# Patient Record
Sex: Male | Born: 1950 | Race: White | Hispanic: No | Marital: Married | Smoking: Never smoker
Health system: Southern US, Community
[De-identification: ages and names within clinical notes are randomized; demographics above are authoritative.]

## PROBLEM LIST (undated history)

## (undated) ENCOUNTER — Encounter

## (undated) ENCOUNTER — Encounter: Attending: Adult Health | Primary: Adult Health

## (undated) ENCOUNTER — Ambulatory Visit
Payer: MEDICARE | Attending: Student in an Organized Health Care Education/Training Program | Primary: Student in an Organized Health Care Education/Training Program

## (undated) ENCOUNTER — Telehealth: Attending: Ambulatory Care | Primary: Ambulatory Care

## (undated) ENCOUNTER — Telehealth

## (undated) ENCOUNTER — Ambulatory Visit: Payer: MEDICARE

## (undated) ENCOUNTER — Encounter: Attending: Registered Nurse | Primary: Registered Nurse

## (undated) ENCOUNTER — Encounter: Attending: Ophthalmology | Primary: Ophthalmology

## (undated) ENCOUNTER — Ambulatory Visit: Payer: Medicare (Managed Care) | Attending: Hematology | Primary: Hematology

## (undated) ENCOUNTER — Ambulatory Visit

## (undated) ENCOUNTER — Encounter
Attending: Student in an Organized Health Care Education/Training Program | Primary: Student in an Organized Health Care Education/Training Program

## (undated) ENCOUNTER — Ambulatory Visit: Attending: Pharmacist | Primary: Pharmacist

## (undated) ENCOUNTER — Inpatient Hospital Stay

## (undated) ENCOUNTER — Telehealth: Attending: Adult Health | Primary: Adult Health

## (undated) ENCOUNTER — Ambulatory Visit: Payer: MEDICARE | Attending: Family | Primary: Family

## (undated) ENCOUNTER — Encounter: Attending: Physician Assistant | Primary: Physician Assistant

## (undated) ENCOUNTER — Encounter: Attending: Family | Primary: Family

## (undated) ENCOUNTER — Encounter: Payer: MEDICARE | Attending: Physician Assistant | Primary: Physician Assistant

## (undated) ENCOUNTER — Encounter: Attending: Registered" | Primary: Registered"

## (undated) ENCOUNTER — Ambulatory Visit: Payer: MEDICARE | Attending: Ambulatory Care | Primary: Ambulatory Care

## (undated) ENCOUNTER — Ambulatory Visit: Payer: Medicare (Managed Care)

## (undated) ENCOUNTER — Other Ambulatory Visit

## (undated) ENCOUNTER — Non-Acute Institutional Stay: Payer: MEDICARE

## (undated) ENCOUNTER — Telehealth: Attending: Nephrology | Primary: Nephrology

## (undated) ENCOUNTER — Encounter: Attending: Ambulatory Care | Primary: Ambulatory Care

## (undated) ENCOUNTER — Ambulatory Visit: Attending: General Practice | Primary: General Practice

## (undated) ENCOUNTER — Ambulatory Visit: Payer: Medicare (Managed Care) | Attending: Nephrology | Primary: Nephrology

## (undated) ENCOUNTER — Encounter: Attending: General Practice | Primary: General Practice

## (undated) ENCOUNTER — Ambulatory Visit: Attending: Physician Assistant | Primary: Physician Assistant

## (undated) ENCOUNTER — Ambulatory Visit
Attending: Student in an Organized Health Care Education/Training Program | Primary: Student in an Organized Health Care Education/Training Program

## (undated) ENCOUNTER — Ambulatory Visit: Payer: MEDICARE | Attending: Physician Assistant | Primary: Physician Assistant

## (undated) ENCOUNTER — Ambulatory Visit: Payer: MEDICARE | Attending: Orthopaedic Trauma | Primary: Orthopaedic Trauma

## (undated) DIAGNOSIS — I509 Heart failure, unspecified: Secondary | ICD-10-CM

## (undated) DIAGNOSIS — I130 Hypertensive heart and chronic kidney disease with heart failure and stage 1 through stage 4 chronic kidney disease, or unspecified chronic kidney disease: Secondary | ICD-10-CM

## (undated) DIAGNOSIS — N183 Chronic kidney disease, stage 3 unspecified: Secondary | ICD-10-CM

## (undated) DIAGNOSIS — M109 Gout, unspecified: Secondary | ICD-10-CM

## (undated) DIAGNOSIS — N289 Disorder of kidney and ureter, unspecified: Secondary | ICD-10-CM

## (undated) DIAGNOSIS — I10 Essential (primary) hypertension: Secondary | ICD-10-CM

## (undated) DIAGNOSIS — E559 Vitamin D deficiency, unspecified: Secondary | ICD-10-CM

## (undated) DIAGNOSIS — G4730 Sleep apnea, unspecified: Secondary | ICD-10-CM

## (undated) HISTORY — PX: TYMPANOPLASTY: SHX33

## (undated) HISTORY — PX: HERNIA REPAIR: SHX51

---

## 2005-02-02 ENCOUNTER — Emergency Department: Payer: Self-pay | Admitting: Emergency Medicine

## 2006-10-01 ENCOUNTER — Other Ambulatory Visit: Payer: Self-pay

## 2006-10-01 ENCOUNTER — Emergency Department: Payer: Self-pay

## 2007-05-07 ENCOUNTER — Ambulatory Visit: Payer: Self-pay | Admitting: Gastroenterology

## 2008-10-07 ENCOUNTER — Ambulatory Visit: Payer: Self-pay | Admitting: Surgery

## 2008-10-14 ENCOUNTER — Ambulatory Visit: Payer: Self-pay | Admitting: Surgery

## 2009-04-04 ENCOUNTER — Emergency Department: Payer: Self-pay | Admitting: Emergency Medicine

## 2009-06-01 ENCOUNTER — Emergency Department: Payer: Self-pay | Admitting: Emergency Medicine

## 2009-06-04 ENCOUNTER — Inpatient Hospital Stay: Payer: Self-pay | Admitting: *Deleted

## 2009-07-08 ENCOUNTER — Emergency Department (HOSPITAL_BASED_OUTPATIENT_CLINIC_OR_DEPARTMENT_OTHER): Admission: EM | Admit: 2009-07-08 | Discharge: 2009-07-08 | Payer: Self-pay | Admitting: Emergency Medicine

## 2009-07-08 ENCOUNTER — Ambulatory Visit: Payer: Self-pay | Admitting: Diagnostic Radiology

## 2010-03-31 ENCOUNTER — Emergency Department: Payer: Self-pay | Admitting: Emergency Medicine

## 2010-12-29 ENCOUNTER — Emergency Department: Payer: Self-pay | Admitting: Emergency Medicine

## 2011-02-09 ENCOUNTER — Ambulatory Visit: Payer: Self-pay | Admitting: Pain Medicine

## 2011-02-10 ENCOUNTER — Ambulatory Visit: Payer: Self-pay | Admitting: Pain Medicine

## 2011-02-15 ENCOUNTER — Ambulatory Visit: Payer: Self-pay | Admitting: Pain Medicine

## 2011-03-02 ENCOUNTER — Ambulatory Visit: Payer: Self-pay | Admitting: Pain Medicine

## 2011-03-08 ENCOUNTER — Ambulatory Visit: Payer: Self-pay | Admitting: Pain Medicine

## 2011-03-24 ENCOUNTER — Ambulatory Visit: Payer: Self-pay | Admitting: Pain Medicine

## 2011-04-27 ENCOUNTER — Ambulatory Visit: Payer: Self-pay | Admitting: Pain Medicine

## 2011-06-09 ENCOUNTER — Ambulatory Visit: Payer: Self-pay | Admitting: Pain Medicine

## 2011-06-22 ENCOUNTER — Ambulatory Visit: Payer: Self-pay | Admitting: Pain Medicine

## 2011-07-25 ENCOUNTER — Emergency Department: Payer: Self-pay | Admitting: Emergency Medicine

## 2011-10-11 ENCOUNTER — Ambulatory Visit: Payer: Self-pay | Admitting: Anesthesiology

## 2012-01-26 ENCOUNTER — Ambulatory Visit: Payer: Self-pay | Admitting: Family Medicine

## 2013-04-02 ENCOUNTER — Emergency Department: Payer: Self-pay | Admitting: Internal Medicine

## 2013-08-30 ENCOUNTER — Emergency Department: Payer: Self-pay | Admitting: Emergency Medicine

## 2013-08-30 LAB — CBC
HCT: 39.8 % — AB (ref 40.0–52.0)
HGB: 12.9 g/dL — ABNORMAL LOW (ref 13.0–18.0)
MCH: 26.4 pg (ref 26.0–34.0)
MCHC: 32.4 g/dL (ref 32.0–36.0)
MCV: 81 fL (ref 80–100)
Platelet: 285 10*3/uL (ref 150–440)
RBC: 4.9 10*6/uL (ref 4.40–5.90)
RDW: 15.3 % — AB (ref 11.5–14.5)
WBC: 8.4 10*3/uL (ref 3.8–10.6)

## 2013-08-30 LAB — BASIC METABOLIC PANEL
Anion Gap: 7 (ref 7–16)
BUN: 25 mg/dL — ABNORMAL HIGH (ref 7–18)
CALCIUM: 8.7 mg/dL (ref 8.5–10.1)
CREATININE: 1.93 mg/dL — AB (ref 0.60–1.30)
Chloride: 103 mmol/L (ref 98–107)
Co2: 28 mmol/L (ref 21–32)
GFR CALC AF AMER: 42 — AB
GFR CALC NON AF AMER: 37 — AB
GLUCOSE: 129 mg/dL — AB (ref 65–99)
OSMOLALITY: 282 (ref 275–301)
Potassium: 3.7 mmol/L (ref 3.5–5.1)
Sodium: 138 mmol/L (ref 136–145)

## 2013-08-30 LAB — CK TOTAL AND CKMB (NOT AT ARMC)
CK, Total: 618 U/L — ABNORMAL HIGH
CK-MB: 2.4 ng/mL (ref 0.5–3.6)

## 2013-08-30 LAB — TROPONIN I

## 2015-05-30 ENCOUNTER — Emergency Department
Admission: EM | Admit: 2015-05-30 | Discharge: 2015-05-30 | Disposition: A | Discharge status: Home / Self Care | Payer: Medicare Other | Attending: Emergency Medicine | Admitting: Emergency Medicine

## 2015-05-30 ENCOUNTER — Emergency Department: Payer: Medicare Other

## 2015-05-30 ENCOUNTER — Encounter: Payer: Self-pay | Admitting: Medical Oncology

## 2015-05-30 DIAGNOSIS — R42 Dizziness and giddiness: Secondary | ICD-10-CM | POA: Insufficient documentation

## 2015-05-30 DIAGNOSIS — J0100 Acute maxillary sinusitis, unspecified: Secondary | ICD-10-CM | POA: Diagnosis not present

## 2015-05-30 DIAGNOSIS — I10 Essential (primary) hypertension: Secondary | ICD-10-CM | POA: Diagnosis not present

## 2015-05-30 DIAGNOSIS — H532 Diplopia: Secondary | ICD-10-CM | POA: Insufficient documentation

## 2015-05-30 HISTORY — DX: Essential (primary) hypertension: I10

## 2015-05-30 LAB — BASIC METABOLIC PANEL
ANION GAP: 6 (ref 5–15)
BUN: 22 mg/dL — ABNORMAL HIGH (ref 6–20)
CALCIUM: 8.9 mg/dL (ref 8.9–10.3)
CO2: 27 mmol/L (ref 22–32)
Chloride: 106 mmol/L (ref 101–111)
Creatinine, Ser: 1.61 mg/dL — ABNORMAL HIGH (ref 0.61–1.24)
GFR, EST AFRICAN AMERICAN: 51 mL/min — AB (ref >60)
GFR, EST NON AFRICAN AMERICAN: 44 mL/min — AB (ref >60)
Glucose, Bld: 101 mg/dL — ABNORMAL HIGH (ref 65–99)
Potassium: 3.8 mmol/L (ref 3.5–5.1)
SODIUM: 139 mmol/L (ref 135–145)

## 2015-05-30 LAB — URINALYSIS COMPLETE WITH MICROSCOPIC (ARMC ONLY)
BACTERIA UA: NONE SEEN
Bilirubin Urine: NEGATIVE
Glucose, UA: NEGATIVE mg/dL
Ketones, ur: NEGATIVE mg/dL
LEUKOCYTES UA: NEGATIVE
Nitrite: NEGATIVE
PH: 5 (ref 5.0–8.0)
PROTEIN: NEGATIVE mg/dL
Specific Gravity, Urine: 1.017 (ref 1.005–1.030)

## 2015-05-30 LAB — CBC
HEMATOCRIT: 40.8 % (ref 40.0–52.0)
Hemoglobin: 13.2 g/dL (ref 13.0–18.0)
MCH: 26.2 pg (ref 26.0–34.0)
MCHC: 32.4 g/dL (ref 32.0–36.0)
MCV: 80.9 fL (ref 80.0–100.0)
Platelets: 338 10*3/uL (ref 150–440)
RBC: 5.04 MIL/uL (ref 4.40–5.90)
RDW: 15.3 % — ABNORMAL HIGH (ref 11.5–14.5)
WBC: 8.1 10*3/uL (ref 3.8–10.6)

## 2015-05-30 LAB — GLUCOSE, CAPILLARY: GLUCOSE-CAPILLARY: 106 mg/dL — AB (ref 65–99)

## 2015-05-30 MED ORDER — AMOXICILLIN-POT CLAVULANATE 875-125 MG PO TABS: 1.0000 | ORAL_TABLET | Freq: Two times a day (BID) | ORAL | Status: DC

## 2015-05-30 NOTE — Discharge Instructions (Signed)
Dizziness °Dizziness is a common problem. It is a feeling of unsteadiness or light-headedness. You may feel like you are about to faint. Dizziness can lead to injury if you stumble or fall. Anyone can become dizzy, but dizziness is more common in older adults. This condition can be caused by a number of things, including medicines, dehydration, or illness. °HOME CARE INSTRUCTIONS °Taking these steps may help with your condition: °Eating and Drinking °· Drink enough fluid to keep your urine clear or pale yellow. This helps to keep you from becoming dehydrated. Try to drink more clear fluids, such as water. °· Do not drink alcohol. °· Limit your caffeine intake if directed by your health care provider. °· Limit your salt intake if directed by your health care provider. °Activity °· Avoid making quick movements. °· Rise slowly from chairs and steady yourself until you feel okay. °· In the morning, first sit up on the side of the bed. When you feel okay, stand slowly while you hold onto something until you know that your balance is fine. °· Move your legs often if you need to stand in one place for a long time. Tighten and relax your muscles in your legs while you are standing. °· Do not drive or operate heavy machinery if you feel dizzy. °· Avoid bending down if you feel dizzy. Place items in your home so that they are easy for you to reach without leaning over. °Lifestyle °· Do not use any tobacco products, including cigarettes, chewing tobacco, or electronic cigarettes. If you need help quitting, ask your health care provider. °· Try to reduce your stress level, such as with yoga or meditation. Talk with your health care provider if you need help. °General Instructions °· Watch your dizziness for any changes. °· Take medicines only as directed by your health care provider. Talk with your health care provider if you think that your dizziness is caused by a medicine that you are taking. °· Tell a friend or a family  member that you are feeling dizzy. If he or she notices any changes in your behavior, have this person call your health care provider. °· Keep all follow-up visits as directed by your health care provider. This is important. °SEEK MEDICAL CARE IF: °· Your dizziness does not go away. °· Your dizziness or light-headedness gets worse. °· You feel nauseous. °· You have reduced hearing. °· You have new symptoms. °· You are unsteady on your feet or you feel like the room is spinning. °SEEK IMMEDIATE MEDICAL CARE IF: °· You vomit or have diarrhea and are unable to eat or drink anything. °· You have problems talking, walking, swallowing, or using your arms, hands, or legs. °· You feel generally weak. °· You are not thinking clearly or you have trouble forming sentences. It may take a friend or family member to notice this. °· You have chest pain, abdominal pain, shortness of breath, or sweating. °· Your vision changes. °· You notice any bleeding. °· You have a headache. °· You have neck pain or a stiff neck. °· You have a fever. °  °This information is not intended to replace advice given to you by your health care provider. Make sure you discuss any questions you have with your health care provider. °  °Document Released: 12/14/2000 Document Revised: 11/04/2014 Document Reviewed: 06/16/2014 °Elsevier Interactive Patient Education ©2016 [employer] ° °Sinusitis, Adult °Sinusitis is redness, soreness, and inflammation of the paranasal sinuses. Paranasal sinuses are air pockets within   the bones of your face. They are located beneath your eyes, in the middle of your forehead, and above your eyes. In healthy paranasal sinuses, mucus is able to drain out, and air is able to circulate through them by way of your nose. However, when your paranasal sinuses are inflamed, mucus and air can become trapped. This can allow bacteria and other germs to grow and cause infection. °Sinusitis can develop quickly and last only a short time  (acute) or continue over a long period (chronic). Sinusitis that lasts for more than 12 weeks is considered chronic. °CAUSES °Causes of sinusitis include: °· Allergies. °· Structural abnormalities, such as displacement of the cartilage that separates your nostrils (deviated septum), which can decrease the air flow through your nose and sinuses and affect sinus drainage. °· Functional abnormalities, such as when the small hairs (cilia) that line your sinuses and help remove mucus do not work properly or are not present. °SIGNS AND SYMPTOMS °Symptoms of acute and chronic sinusitis are the same. The primary symptoms are pain and pressure around the affected sinuses. Other symptoms include: °· Upper toothache. °· Earache. °· Headache. °· Bad breath. °· Decreased sense of smell and taste. °· A cough, which worsens when you are lying flat. °· Fatigue. °· Fever. °· Thick drainage from your nose, which often is green and may contain pus (purulent). °· Swelling and warmth over the affected sinuses. °DIAGNOSIS °Your health care provider will perform a physical exam. During your exam, your health care provider may perform any of the following to help determine if you have acute sinusitis or chronic sinusitis: °· Look in your nose for signs of abnormal growths in your nostrils (nasal polyps). °· Tap over the affected sinus to check for signs of infection. °· View the inside of your sinuses using an imaging device that has a light attached (endoscope). °If your health care provider suspects that you have chronic sinusitis, one or more of the following tests may be recommended: °· Allergy tests. °· Nasal culture. A sample of mucus is taken from your nose, sent to a lab, and screened for bacteria. °· Nasal cytology. A sample of mucus is taken from your nose and examined by your health care provider to determine if your sinusitis is related to an allergy. °TREATMENT °Most cases of acute sinusitis are related to a viral infection  and will resolve on their own within 10 days. Sometimes, medicines are prescribed to help relieve symptoms of both acute and chronic sinusitis. These may include pain medicines, decongestants, nasal steroid sprays, or saline sprays. °However, for sinusitis related to a bacterial infection, your health care provider will prescribe antibiotic medicines. These are medicines that will help kill the bacteria causing the infection. °Rarely, sinusitis is caused by a fungal infection. In these cases, your health care provider will prescribe antifungal medicine. °For some cases of chronic sinusitis, surgery is needed. Generally, these are cases in which sinusitis recurs more than 3 times per year, despite other treatments. °HOME CARE INSTRUCTIONS °· Drink plenty of water. Water helps thin the mucus so your sinuses can drain more easily. °· Use a humidifier. °· Inhale steam 3-4 times a day (for example, sit in the bathroom with the shower running). °· Apply a warm, moist washcloth to your face 3-4 times a day, or as directed by your health care provider. °· Use saline nasal sprays to help moisten and clean your sinuses. °· Take medicines only as directed by your health care provider. °·   If you were prescribed either an antibiotic or antifungal medicine, finish it all even if you start to feel better. °SEEK IMMEDIATE MEDICAL CARE IF: °· You have increasing pain or severe headaches. °· You have nausea, vomiting, or drowsiness. °· You have swelling around your face. °· You have vision problems. °· You have a stiff neck. °· You have difficulty breathing. °  °This information is not intended to replace advice given to you by your health care provider. Make sure you discuss any questions you have with your health care provider. °  °Document Released: 06/20/2005 Document Revised: 07/11/2014 Document Reviewed: 07/05/2011 °Elsevier Interactive Patient Education ©2016 Elsevier Inc. ° °

## 2015-05-30 NOTE — ED Notes (Signed)
Discussed discharge instructions, prescriptions, and follow-up care with patient. No questions or concerns at this time. Pt stable at discharge.  

## 2015-05-30 NOTE — ED Notes (Signed)
Pt reports that he has been having double vision and dizziness since yesterday around 1900. Pt also reports neck pain.

## 2015-05-30 NOTE — ED Provider Notes (Signed)
[facility] Emergency Department Provider Note  ____________________________________________  Time seen: 4:40 PM  I have reviewed the triage vital signs and the nursing notes.   HISTORY  Chief Complaint Dizziness and Diplopia    HPI Wyatt Hernandez is a 64 y.o. male who complains of double vision and dizziness since yesterday evening. It was gradual onset he just noticed as he was standing up that he seemed be having double vision. No headaches numbness tingling or weakness. No falls or syncope. No chest pain shortness of breath or neck pain.     Past Medical History  Diagnosis Date  . Hypertension      There are no active problems to display for this patient.    Past Surgical History  Procedure Laterality Date  . Hernia repair     Morbid obesity  Current Outpatient Rx  Name  Route  Sig  Dispense  Refill  . amoxicillin-clavulanate (AUGMENTIN) 875-125 MG tablet   Oral   Take 1 tablet by mouth 2 (two) times daily.   20 tablet   0      Allergies Shellfish allergy   No family history on file.  Social History Social History  Substance Use Topics  . Smoking status: Never Smoker   . Smokeless tobacco: None  . Alcohol Use: No    Review of Systems  Constitutional:   No fever or chills. No weight changes Eyes:   No blurry vision or double vision.  ENT:   No sore throat. Cardiovascular:   No chest pain. Respiratory:   No dyspnea or cough. Gastrointestinal:   Negative for abdominal pain, vomiting and diarrhea.  No BRBPR or melena. Genitourinary:   Negative for dysuria, urinary retention, bloody urine, or difficulty urinating. Musculoskeletal:   Negative for back pain. No joint swelling or pain. Skin:   Negative for rash. Neurological:   Negative for headaches, focal weakness or numbness. Psychiatric:  No anxiety or depression.   Endocrine:  No hot/cold intolerance, changes in energy, or sleep difficulty.  10-point ROS  otherwise negative.  ____________________________________________   PHYSICAL EXAM:  VITAL SIGNS: ED Triage Vitals  Enc Vitals Group     BP 05/30/15 1435 131/82 mmHg     Pulse Rate 05/30/15 1435 66     Resp 05/30/15 1435 20     Temp 05/30/15 1435 98.9 F (37.2 C)     Temp Source 05/30/15 1435 Oral     SpO2 05/30/15 1435 98 %     Weight 05/30/15 1430 300 lb (136.079 kg)     Height 05/30/15 1430  (1.626 m)     Head Cir --      Peak Flow --      Pain Score 05/30/15 1436 7     Pain Loc --      Pain Edu? --      Excl. in GC? --      Constitutional:   Alert and oriented. Well appearing and in no distress. Eyes:   No scleral icterus. No conjunctival pallor. PERRL. EOMI ENT   Head:   Normocephalic and atraumatic.   Nose:   No congestion/rhinnorhea. No septal hematoma   Mouth/Throat:   MMM, no pharyngeal erythema. No peritonsillar mass. No uvula shift.   Neck:   No stridor. No SubQ emphysema. No meningismus. C-spine nontender Hematological/Lymphatic/Immunilogical:   No cervical lymphadenopathy. Cardiovascular:   RRR. Normal and symmetric distal pulses are present in all extremities. No murmurs, rubs, or gallops. Respiratory:   Normal  respiratory effort without tachypnea nor retractions. Breath sounds are clear and equal bilaterally. No wheezes/rales/rhonchi. Gastrointestinal:   Soft and nontender. No distention. There is no CVA tenderness.  No rebound, rigidity, or guarding. Genitourinary:   deferred Musculoskeletal:   Nontender with normal range of motion in all extremities. No joint effusions.  No lower extremity tenderness.  No edema. Neurologic:   Normal speech and language.  CN 2-10 normal. Motor grossly intact. No pronator drift.  Normal gait. No gross focal neurologic deficits are appreciated.  Skin:    Skin is warm, dry and intact. No rash noted.  No petechiae, purpura, or bullae. Psychiatric:   Mood and affect are normal. Speech and behavior are normal.  Patient exhibits appropriate insight and judgment.  ____________________________________________    LABS (pertinent positives/negatives) (all labs ordered are listed, but only abnormal results are displayed) Labs Reviewed  BASIC METABOLIC PANEL - Abnormal; Notable for the following:    Glucose, Bld 101 (*)    BUN 22 (*)    Creatinine, Ser 1.61 (*)    GFR calc non Af Amer 44 (*)    GFR calc Af Amer 51 (*)    All other components within normal limits  CBC - Abnormal; Notable for the following:    RDW 15.3 (*)    All other components within normal limits  URINALYSIS COMPLETEWITH MICROSCOPIC ([facility] ONLY) - Abnormal; Notable for the following:    Color, Urine YELLOW (*)    APPearance CLEAR (*)    Hgb urine dipstick 1+ (*)    Squamous Epithelial / LPF 0-5 (*)    All other components within normal limits  GLUCOSE, CAPILLARY - Abnormal; Notable for the following:    Glucose-Capillary 106 (*)    All other components within normal limits  CBG MONITORING, ED   ____________________________________________   EKG  Interpreted by me Normal sinus rhythm rate of 61, normal axis intervals. Full dictated criteria for LVH in the high lateral leads. Normal ST segments and T waves  ____________________________________________    RADIOLOGY  Chest x-ray unremarkable CT head shows evidence of right maxillary sinusitis without any other intracranial abnormality  ____________________________________________   PROCEDURES   ____________________________________________   INITIAL IMPRESSION / ASSESSMENT AND PLAN / ED COURSE  Pertinent labs & imaging results that were available during my care of the patient were reviewed by me and considered in my medical decision making (see chart for details).  Patient well appearing no acute distress, normal vitals. Presents with reports of diplopia and feeling dizzy with standing. Found to have likely sinusitis which may be causing the symptoms.  Neurologically he is otherwise intact on exam. Low suspicion for intracranial hemorrhage or stroke, no evidence of encephalitis meningitis or carotid dissection or other injury. Don't think he is having optic neuritis glaucoma or temporal arteritis. Patient counseled to follow up with primary care and ophthalmology. I'll start him on Augmentin for now. Medically stable, suitable for outpatient follow-up.     ____________________________________________   FINAL CLINICAL IMPRESSION(S) / ED DIAGNOSES  Final diagnoses:  Dizziness  Acute maxillary sinusitis, recurrence not specified      Sharman CheekPhillip Brieanne Mignone, MD 05/30/15 1815

## 2016-07-11 DIAGNOSIS — N183 Chronic kidney disease, stage 3 (moderate): Secondary | ICD-10-CM | POA: Diagnosis not present

## 2016-07-14 DIAGNOSIS — N183 Chronic kidney disease, stage 3 (moderate): Secondary | ICD-10-CM | POA: Diagnosis not present

## 2016-07-25 DIAGNOSIS — M109 Gout, unspecified: Secondary | ICD-10-CM | POA: Diagnosis not present

## 2016-07-25 DIAGNOSIS — Z79891 Long term (current) use of opiate analgesic: Secondary | ICD-10-CM | POA: Diagnosis not present

## 2016-07-25 DIAGNOSIS — M545 Low back pain: Secondary | ICD-10-CM | POA: Diagnosis not present

## 2016-07-25 DIAGNOSIS — G894 Chronic pain syndrome: Secondary | ICD-10-CM | POA: Diagnosis not present

## 2016-07-27 DIAGNOSIS — H40003 Preglaucoma, unspecified, bilateral: Secondary | ICD-10-CM | POA: Diagnosis not present

## 2016-08-09 DIAGNOSIS — H401111 Primary open-angle glaucoma, right eye, mild stage: Secondary | ICD-10-CM | POA: Diagnosis not present

## 2016-08-15 ENCOUNTER — Emergency Department: Payer: Medicare HMO

## 2016-08-15 ENCOUNTER — Observation Stay: Payer: Medicare HMO

## 2016-08-15 ENCOUNTER — Observation Stay
Admission: EM | Admit: 2016-08-15 | Discharge: 2016-08-17 | Disposition: A | Discharge status: Home / Self Care | Payer: Medicare HMO | Attending: Internal Medicine | Admitting: Internal Medicine

## 2016-08-15 ENCOUNTER — Encounter: Payer: Self-pay | Admitting: Emergency Medicine

## 2016-08-15 DIAGNOSIS — Z6841 Body Mass Index (BMI) 40.0 and over, adult: Secondary | ICD-10-CM | POA: Insufficient documentation

## 2016-08-15 DIAGNOSIS — I5032 Chronic diastolic (congestive) heart failure: Secondary | ICD-10-CM | POA: Diagnosis not present

## 2016-08-15 DIAGNOSIS — F329 Major depressive disorder, single episode, unspecified: Secondary | ICD-10-CM | POA: Diagnosis not present

## 2016-08-15 DIAGNOSIS — N400 Enlarged prostate without lower urinary tract symptoms: Secondary | ICD-10-CM | POA: Diagnosis not present

## 2016-08-15 DIAGNOSIS — M1711 Unilateral primary osteoarthritis, right knee: Secondary | ICD-10-CM | POA: Diagnosis not present

## 2016-08-15 DIAGNOSIS — R609 Edema, unspecified: Secondary | ICD-10-CM

## 2016-08-15 DIAGNOSIS — Z91013 Allergy to seafood: Secondary | ICD-10-CM | POA: Insufficient documentation

## 2016-08-15 DIAGNOSIS — Z7982 Long term (current) use of aspirin: Secondary | ICD-10-CM | POA: Diagnosis not present

## 2016-08-15 DIAGNOSIS — M25461 Effusion, right knee: Secondary | ICD-10-CM | POA: Diagnosis not present

## 2016-08-15 DIAGNOSIS — R079 Chest pain, unspecified: Principal | ICD-10-CM | POA: Insufficient documentation

## 2016-08-15 DIAGNOSIS — Z9119 Patient's noncompliance with other medical treatment and regimen: Secondary | ICD-10-CM | POA: Diagnosis not present

## 2016-08-15 DIAGNOSIS — I130 Hypertensive heart and chronic kidney disease with heart failure and stage 1 through stage 4 chronic kidney disease, or unspecified chronic kidney disease: Secondary | ICD-10-CM | POA: Diagnosis not present

## 2016-08-15 DIAGNOSIS — N183 Chronic kidney disease, stage 3 (moderate): Secondary | ICD-10-CM | POA: Diagnosis not present

## 2016-08-15 DIAGNOSIS — R69 Illness, unspecified: Secondary | ICD-10-CM | POA: Diagnosis not present

## 2016-08-15 DIAGNOSIS — E559 Vitamin D deficiency, unspecified: Secondary | ICD-10-CM | POA: Insufficient documentation

## 2016-08-15 DIAGNOSIS — R0789 Other chest pain: Secondary | ICD-10-CM

## 2016-08-15 DIAGNOSIS — G4733 Obstructive sleep apnea (adult) (pediatric): Secondary | ICD-10-CM | POA: Insufficient documentation

## 2016-08-15 DIAGNOSIS — G4730 Sleep apnea, unspecified: Secondary | ICD-10-CM | POA: Diagnosis not present

## 2016-08-15 DIAGNOSIS — R0609 Other forms of dyspnea: Secondary | ICD-10-CM

## 2016-08-15 DIAGNOSIS — R0602 Shortness of breath: Secondary | ICD-10-CM | POA: Diagnosis not present

## 2016-08-15 DIAGNOSIS — M109 Gout, unspecified: Secondary | ICD-10-CM | POA: Insufficient documentation

## 2016-08-15 HISTORY — DX: Chronic kidney disease, stage 3 unspecified: N18.30

## 2016-08-15 HISTORY — DX: Gout, unspecified: M10.9

## 2016-08-15 HISTORY — DX: Vitamin D deficiency, unspecified: E55.9

## 2016-08-15 HISTORY — DX: Heart failure, unspecified: I50.9

## 2016-08-15 HISTORY — DX: Hypertensive heart and chronic kidney disease with heart failure and stage 1 through stage 4 chronic kidney disease, or unspecified chronic kidney disease: I13.0

## 2016-08-15 HISTORY — DX: Disorder of kidney and ureter, unspecified: N28.9

## 2016-08-15 HISTORY — DX: Chronic kidney disease, stage 3 (moderate): N18.3

## 2016-08-15 HISTORY — DX: Sleep apnea, unspecified: G47.30

## 2016-08-15 LAB — HEPATIC FUNCTION PANEL
ALBUMIN: 4 g/dL (ref 3.5–5.0)
ALK PHOS: 48 U/L (ref 38–126)
ALT: 17 U/L (ref 17–63)
AST: 20 U/L (ref 15–41)
Bilirubin, Direct: <0.1 mg/dL — ABNORMAL LOW (ref 0.1–0.5)
TOTAL PROTEIN: 8 g/dL (ref 6.5–8.1)
Total Bilirubin: 0.6 mg/dL (ref 0.3–1.2)

## 2016-08-15 LAB — BASIC METABOLIC PANEL
Anion gap: 7 (ref 5–15)
BUN: 27 mg/dL — AB (ref 6–20)
CHLORIDE: 104 mmol/L (ref 101–111)
CO2: 28 mmol/L (ref 22–32)
Calcium: 9.1 mg/dL (ref 8.9–10.3)
Creatinine, Ser: 1.55 mg/dL — ABNORMAL HIGH (ref 0.61–1.24)
GFR calc Af Amer: 53 mL/min — ABNORMAL LOW (ref >60)
GFR calc non Af Amer: 46 mL/min — ABNORMAL LOW (ref >60)
Glucose, Bld: 90 mg/dL (ref 65–99)
POTASSIUM: 4.1 mmol/L (ref 3.5–5.1)
SODIUM: 139 mmol/L (ref 135–145)

## 2016-08-15 LAB — CBC
HEMATOCRIT: 39.5 % — AB (ref 40.0–52.0)
HEMOGLOBIN: 13 g/dL (ref 13.0–18.0)
MCH: 26.5 pg (ref 26.0–34.0)
MCHC: 32.8 g/dL (ref 32.0–36.0)
MCV: 80.8 fL (ref 80.0–100.0)
Platelets: 280 10*3/uL (ref 150–440)
RBC: 4.89 MIL/uL (ref 4.40–5.90)
RDW: 15.4 % — ABNORMAL HIGH (ref 11.5–14.5)
WBC: 5.6 10*3/uL (ref 3.8–10.6)

## 2016-08-15 LAB — TROPONIN I
Troponin I: <0.03 ng/mL (ref <0.03)
Troponin I: <0.03 ng/mL (ref <0.03)

## 2016-08-15 LAB — LIPASE, BLOOD: Lipase: 34 U/L (ref 11–51)

## 2016-08-15 LAB — BRAIN NATRIURETIC PEPTIDE: B Natriuretic Peptide: 22 pg/mL (ref 0.0–100.0)

## 2016-08-15 MED ORDER — AMLODIPINE BESYLATE 10 MG PO TABS
10.0000 mg | ORAL_TABLET | Freq: Every day | ORAL | Status: DC
  Administered 2016-08-15 – 2016-08-17 (×3): 10 mg via ORAL
  Filled 2016-08-15 (×3): qty 1

## 2016-08-15 MED ORDER — SODIUM CHLORIDE 0.9% FLUSH
3.0000 mL | Freq: Two times a day (BID) | INTRAVENOUS | Status: DC
  Administered 2016-08-15 – 2016-08-16 (×2): 3 mL via INTRAVENOUS

## 2016-08-15 MED ORDER — ONDANSETRON HCL 4 MG/2ML IJ SOLN: 4.0000 mg | Freq: Four times a day (QID) | INTRAMUSCULAR | Status: DC | PRN

## 2016-08-15 MED ORDER — ALBUTEROL SULFATE (2.5 MG/3ML) 0.083% IN NEBU: 2.5000 mg | INHALATION_SOLUTION | Freq: Four times a day (QID) | RESPIRATORY_TRACT | Status: DC | PRN

## 2016-08-15 MED ORDER — ACETAMINOPHEN 650 MG RE SUPP: 650.0000 mg | Freq: Four times a day (QID) | RECTAL | Status: DC | PRN

## 2016-08-15 MED ORDER — VITAMIN B-12 100 MCG PO TABS
50.0000 ug | ORAL_TABLET | Freq: Every day | ORAL | Status: DC
  Administered 2016-08-16 – 2016-08-17 (×2): 50 ug via ORAL
  Filled 2016-08-15 (×2): qty 1

## 2016-08-15 MED ORDER — ASPIRIN 81 MG PO CHEW
324.0000 mg | CHEWABLE_TABLET | Freq: Once | ORAL | Status: DC
  Filled 2016-08-15 (×2): qty 4

## 2016-08-15 MED ORDER — SODIUM CHLORIDE 0.9 % IV SOLN: 250.0000 mL | INTRAVENOUS | Status: DC | PRN

## 2016-08-15 MED ORDER — OXYCODONE-ACETAMINOPHEN 5-325 MG PO TABS
1.0000 | ORAL_TABLET | Freq: Every day | ORAL | Status: DC
  Administered 2016-08-15 – 2016-08-16 (×2): 1 via ORAL
  Filled 2016-08-15 (×2): qty 1

## 2016-08-15 MED ORDER — ENOXAPARIN SODIUM 40 MG/0.4ML ~~LOC~~ SOLN
40.0000 mg | Freq: Two times a day (BID) | SUBCUTANEOUS | Status: DC
  Administered 2016-08-15 – 2016-08-17 (×3): 40 mg via SUBCUTANEOUS
  Filled 2016-08-15 (×3): qty 0.4

## 2016-08-15 MED ORDER — ONDANSETRON HCL 4 MG PO TABS: 4.0000 mg | ORAL_TABLET | Freq: Four times a day (QID) | ORAL | Status: DC | PRN

## 2016-08-15 MED ORDER — ALBUTEROL SULFATE HFA 108 (90 BASE) MCG/ACT IN AERS: 2.0000 | INHALATION_SPRAY | Freq: Four times a day (QID) | RESPIRATORY_TRACT | Status: DC | PRN

## 2016-08-15 MED ORDER — ACETAMINOPHEN 325 MG PO TABS: 650.0000 mg | ORAL_TABLET | Freq: Four times a day (QID) | ORAL | Status: DC | PRN

## 2016-08-15 MED ORDER — CHOLECALCIFEROL 10 MCG (400 UNIT) PO TABS
400.0000 [IU] | ORAL_TABLET | Freq: Every day | ORAL | Status: DC
  Administered 2016-08-15 – 2016-08-17 (×3): 400 [IU] via ORAL
  Filled 2016-08-15 (×3): qty 1

## 2016-08-15 MED ORDER — METOPROLOL TARTRATE 50 MG PO TABS
50.0000 mg | ORAL_TABLET | Freq: Every day | ORAL | Status: DC
  Administered 2016-08-15 – 2016-08-17 (×3): 50 mg via ORAL
  Filled 2016-08-15 (×3): qty 1

## 2016-08-15 MED ORDER — LISINOPRIL 20 MG PO TABS
40.0000 mg | ORAL_TABLET | Freq: Every day | ORAL | Status: DC
  Administered 2016-08-15 – 2016-08-17 (×3): 40 mg via ORAL
  Filled 2016-08-15 (×3): qty 2

## 2016-08-15 MED ORDER — TIZANIDINE HCL 4 MG PO TABS
4.0000 mg | ORAL_TABLET | Freq: Three times a day (TID) | ORAL | Status: DC
  Administered 2016-08-15 – 2016-08-17 (×6): 4 mg via ORAL
  Filled 2016-08-15 (×6): qty 1

## 2016-08-15 MED ORDER — SODIUM CHLORIDE 0.9% FLUSH: 3.0000 mL | INTRAVENOUS | Status: DC | PRN

## 2016-08-15 MED ORDER — SODIUM CHLORIDE 0.9% FLUSH
3.0000 mL | Freq: Two times a day (BID) | INTRAVENOUS | Status: DC
  Administered 2016-08-15 – 2016-08-17 (×2): 3 mL via INTRAVENOUS

## 2016-08-15 MED ORDER — TAMSULOSIN HCL 0.4 MG PO CAPS
0.4000 mg | ORAL_CAPSULE | Freq: Every day | ORAL | Status: DC
  Administered 2016-08-15 – 2016-08-17 (×3): 0.4 mg via ORAL
  Filled 2016-08-15 (×3): qty 1

## 2016-08-15 MED ORDER — ASPIRIN EC 325 MG PO TBEC
325.0000 mg | DELAYED_RELEASE_TABLET | Freq: Every day | ORAL | Status: DC
  Administered 2016-08-16 – 2016-08-17 (×2): 325 mg via ORAL
  Filled 2016-08-15 (×3): qty 1

## 2016-08-15 MED ORDER — VENLAFAXINE HCL 37.5 MG PO TABS
37.5000 mg | ORAL_TABLET | Freq: Two times a day (BID) | ORAL | Status: DC
  Administered 2016-08-15 – 2016-08-17 (×4): 37.5 mg via ORAL
  Filled 2016-08-15 (×5): qty 1

## 2016-08-15 NOTE — ED Triage Notes (Signed)
Shortness of breath x 2 months, worse with walking.

## 2016-08-15 NOTE — H&P (Signed)
[employer] - [place] [facility]   PATIENT NAME: Wyatt Hernandez    MR#:  [id number]  DATE OF BIRTH:  26-Jan-1951  DATE OF ADMISSION:  08/15/2016  PRIMARY CARE PHYSICIAN: Leanna SatoMILES,LINDA M, MD   REQUESTING/REFERRING PHYSICIAN: Estill Battenarolina Vernosese MD  CHIEF COMPLAINT:   Chief Complaint  Patient presents with  . Shortness of Breath    HISTORY OF PRESENT ILLNESS: Wyatt Hernandez  is a 66 y.o. male with a known history of  Morbid obesity, history of congestive heart failure type unknown, gout, essential hypertension, sleep apnea who states that he wears CPAP all the time and vitamin D deficiency presenting to the emergency room with complaining of shortness of breath and chest pressure. Patient reports that he is having intermittent chest pressure for months now. However this is not on consistent basis. Last time he had the symptoms was so weak ago. He describes it as a pressure-like feeling in his chest with exertion. Patient also states that he's been having shortness of breath with minimal exertion and has gotten progressively worse. He denies any fevers chills no chest pain or palpitations  PAST MEDICAL HISTORY:   Past Medical History:  Diagnosis Date  . 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 CHF (congestive heart failure) (HCC)   . Gout   . Hypertension   . Renal disorder   . Sleep apnea   . Vitamin D deficiency     PAST SURGICAL HISTORY:  Past Surgical History:  Procedure Laterality Date  . HERNIA REPAIR      SOCIAL HISTORY:  Social History  Substance Use Topics  . Smoking status: Never Smoker  . Smokeless tobacco: Never Used  . Alcohol use 1.2 oz/week    2 Cans of beer per week    FAMILY HISTORY:  Family History  Problem Relation Age of Onset  . Heart failure Father     DRUG ALLERGIES:  Allergies  Allergen Reactions  . Shellfish Allergy Nausea And Vomiting    REVIEW OF SYSTEMS:   CONSTITUTIONAL: No fever,  fatigue or weakness.  EYES: No blurred or double vision.  EARS, NOSE, AND THROAT: No tinnitus or ear pain.  RESPIRATORY: No cough,Positive shortness of breath, no wheezing or hemoptysis.  CARDIOVASCULAR: Intermittent chest pain, orthopnea, edema.  GASTROINTESTINAL: No nausea, vomiting, diarrhea or abdominal pain.  GENITOURINARY: No dysuria, hematuria.  ENDOCRINE: No polyuria, nocturia,  HEMATOLOGY: No anemia, easy bruising or bleeding SKIN: No rash or lesion. MUSCULOSKELETAL: No joint pain or arthritis.  Complains of right knee swelling NEUROLOGIC: No tingling, numbness, weakness.  PSYCHIATRY: No anxiety or depression.   MEDICATIONS AT HOME:  Prior to Admission medications   Medication Sig Start Date End Date Taking? Authorizing Provider  amLODipine (NORVASC) 10 MG tablet Take 10 mg by mouth daily. 11/06/12  Yes Historical Provider, MD  cyanocobalamin 50 MCG tablet Take 1 tablet by mouth daily.   Yes Historical Provider, MD  hydrochlorothiazide (MICROZIDE) 12.5 MG capsule Take 12.5 mg by mouth daily. 11/06/12  Yes Historical Provider, MD  lisinopril (PRINIVIL,ZESTRIL) 40 MG tablet Take 40 mg by mouth daily. 11/06/12  Yes Historical Provider, MD  metoprolol (LOPRESSOR) 50 MG tablet Take 50 mg by mouth daily. 11/06/12  Yes Historical Provider, MD  oxyCODONE-acetaminophen (PERCOCET/ROXICET) 5-325 MG tablet Take 1 tablet by mouth daily.   Yes Historical Provider, MD  tamsulosin (FLOMAX) 0.4 MG CAPS capsule Take 0.4 mg by mouth daily.   Yes Historical Provider, MD  tiZANidine (ZANAFLEX) 4 MG capsule Take 4 mg by mouth 3 (three) times daily.   Yes Historical Provider, MD  venlafaxine (EFFEXOR) 37.5 MG tablet Take 37.5 mg by mouth 2 (two) times daily.   Yes Historical Provider, MD  Vitamin D, Cholecalciferol, 400 units TABS Take 1 tablet by mouth daily. 08/15/12  Yes Historical Provider, MD  albuterol (PROVENTIL HFA;VENTOLIN HFA) 108 (90 Base) MCG/ACT inhaler Inhale 2 puffs into the lungs every 6 (six)  hours as needed.    Historical Provider, MD      PHYSICAL EXAMINATION:   VITAL SIGNS: Blood pressure (!) 148/83, pulse 64, temperature 99.3 F (37.4 C), temperature source Oral, resp. rate 18, height 5\' 2"  (1.575 m), weight (!) 305 lb (138.3 kg), SpO2 99 %.  GENERAL:  66 y.o.-year-old patient lying in the bed with no acute distress.  EYES: Pupils equal, round, reactive to light and accommodation. No scleral icterus. Extraocular muscles intact.  HEENT: Head atraumatic, normocephalic. Oropharynx and nasopharynx clear.  NECK:  Supple, no jugular venous distention. No thyroid enlargement, no tenderness.  LUNGS: Normal breath sounds bilaterally, no wheezing, rales,rhonchi or crepitation. No use of accessory muscles of respiration.  CARDIOVASCULAR: S1, S2 normal. No murmurs, rubs, or gallops.  ABDOMEN: Soft, nontender, nondistended. Bowel sounds present. No organomegaly or mass.  EXTREMITIES: No pedal edema, cyanosis, or clubbing. Right knee there is a cystic like structure NEUROLOGIC: Cranial nerves II through XII are intact. Muscle strength 5/5 in all extremities. Sensation intact. Gait not checked.  PSYCHIATRIC: The patient is alert and oriented x 3.  SKIN: No obvious rash, lesion, or ulcer.   LABORATORY PANEL:   CBC  Recent Labs Lab 08/15/16 1254  WBC 5.6  HGB 13.0  HCT 39.5*  PLT 280  MCV 80.8  MCH 26.5  MCHC 32.8  RDW 15.4*   ------------------------------------------------------------------------------------------------------------------  Chemistries   Recent Labs Lab 08/15/16 1254  NA 139  K 4.1  CL 104  CO2 28  GLUCOSE 90  BUN 27*  CREATININE 1.55*  CALCIUM 9.1  AST 20  ALT 17  ALKPHOS 48  BILITOT 0.6   ------------------------------------------------------------------------------------------------------------------ estimated creatinine clearance is 60 mL/min (by C-G formula based on SCr of 1.55 mg/dL  (H)). ------------------------------------------------------------------------------------------------------------------ No results for input(s): TSH, T4TOTAL, T3FREE, THYROIDAB in the last 72 hours.  Invalid input(s): FREET3   Coagulation profile No results for input(s): INR, PROTIME in the last 168 hours. ------------------------------------------------------------------------------------------------------------------- No results for input(s): DDIMER in the last 72 hours. -------------------------------------------------------------------------------------------------------------------  Cardiac Enzymes  Recent Labs Lab 08/15/16 1254 08/15/16 1607  TROPONINI <0.03 <0.03   ------------------------------------------------------------------------------------------------------------------ Invalid input(s): POCBNP  ---------------------------------------------------------------------------------------------------------------  Urinalysis    Component Value Date/Time   COLORURINE YELLOW (A) 05/30/2015 1440   APPEARANCEUR CLEAR (A) 05/30/2015 1440   LABSPEC 1.017 05/30/2015 1440   PHURINE 5.0 05/30/2015 1440   GLUCOSEU NEGATIVE 05/30/2015 1440   HGBUR 1+ (A) 05/30/2015 1440   BILIRUBINUR NEGATIVE 05/30/2015 1440   KETONESUR NEGATIVE 05/30/2015 1440   PROTEINUR NEGATIVE 05/30/2015 1440   NITRITE NEGATIVE 05/30/2015 1440   LEUKOCYTESUR NEGATIVE 05/30/2015 1440     RADIOLOGY: Dg Chest 2 View  Result Date: 08/15/2016 CLINICAL DATA:  Shortness of breath for 2 months. Lower extremity swelling. EXAM: CHEST  2 VIEW COMPARISON:  05/30/2015 FINDINGS: The heart size and mediastinal contours are within normal limits. Both lungs are clear. No evidence of pneumothorax or pleural effusion. Mild thoracic spine degenerative changes again noted . IMPRESSION: No active cardiopulmonary disease. Electronically Signed   By: Jonny Ruiz  Eppie Gibson M.D.   On: 08/15/2016 13:17    EKG: Orders placed or  performed during the hospital encounter of 08/15/16  . EKG 12-Lead  . EKG 12-Lead  . ED EKG within 10 minutes  . ED EKG within 10 minutes    IMPRESSION AND PLAN: Patient is a 65 year old morbidly obese male presenting with chest pain and dyspnea on exertion  1. Chest pain and dyspnea on exertion I will place patient under observation We'll follow serial cardiac enzymes I will schedule for stress mibi tomm morning Will obtain echocardiogram of his heart We'll treat him with low-dose IV Lasix in case if he has low grade congestive heart failure  2. Essential hypertension I will continue his Norvasc and lisinopril and metoprolol I will hold his hydrochlorothiazide in light of IV Lasix  3. Sleep apnea CPAP at bedtime  4. Right knee swelling will obtain x-ray of his knee  5. Miscellaneous Lovenox for DVT prophylaxis  All the records are reviewed and case discussed with ED provider. Management plans discussed with the patient, family and they are in agreement.  CODE STATUS: Code Status History    This patient does not have a recorded code status. Please follow your organizational policy for patients in this situation.       TOTAL TIME TAKING CARE OF THIS PATIENT40minutes.    Auburn Bilberry M.D on 08/15/2016 at 5:55 PM  Between 7am to 6pm - Pager - [phone]  After 6pm go to [email or web address] - password [email or web address]  [place] [facility]  Office  [phone]  CC: Primary care physician; Leanna Sato, MD

## 2016-08-15 NOTE — Progress Notes (Signed)
Anticoagulation monitoring(Lovenox):  66yo  male ordered Lovenox 40 mg Q24h  Filed Weights   08/15/16 1247 08/15/16 1848  Weight: (!) 305 lb (138.3 kg) (!) 301 lb 11.2 oz (136.9 kg)   BMI 56.1   Lab Results  Component Value Date   CREATININE 1.55 (H) 08/15/2016   CREATININE 1.61 (H) 05/30/2015   CREATININE 1.93 (H) 08/30/2013   Estimated Creatinine Clearance: 59.6 mL/min (by C-G formula based on SCr of 1.55 mg/dL (H)). Hemoglobin & Hematocrit     Component Value Date/Time   HGB 13.0 08/15/2016 1254   HGB 12.9 (L) 08/30/2013 1420   HCT 39.5 (L) 08/15/2016 1254   HCT 39.8 (L) 08/30/2013 1420     Per Protocol for Patient with estCrcl > 30 ml/min and BMI > 40, will transition to Lovenox 40 mg Q12h.

## 2016-08-15 NOTE — ED Provider Notes (Signed)
[facility] Emergency Department Provider Note  ____________________________________________  Time seen: Approximately 2:41 PM  I have reviewed the triage vital signs and the nursing notes.   HISTORY  Chief Complaint Shortness of Breath   HPI Wyatt Hernandez is a 66 y.o. male with a history of obstructive sleep apnea on CPAP, hypertension, chronic kidney disease, obesity who presents for evaluation of shortness of breath. Patient reports dyspnea with minimal exertion that has been going on for the last 2 months. He reports that he is now short of breath when he walks from his bedroom to the kitchen. Also reports occasional episodes of chest pain that he describes as a sharp pain located in the center of his chest, associated with diaphoresis, dizziness, nausea, shortness of breath today usually happen with minimal exertion and resolved when patient sits down. The last episode happened a week ago. Patient has a strong family history of ischemic heart disease with multiple siblings dying in their mid 25s from MIs. He is not a smoker. He denies any chest pain at this time. No orthopnea or shortness of breath at rest. No URI symptoms. No weight gain. Patient noticed mild swelling of his right knee today and called his primary care doctor. While talking to them on the phone he mentioned his shortness of breath that has been going on for the last 2 months and was encouraged to come to the emergency room for evaluation.  Past Medical History:  Diagnosis Date  . CHF (congestive heart failure) (HCC)   . Hypertension   . Renal disorder     There are no active problems to display for this patient.   Past Surgical History:  Procedure Laterality Date  . HERNIA REPAIR      Prior to Admission medications   Medication Sig Start Date End Date Taking? Authorizing Provider  amoxicillin-clavulanate (AUGMENTIN) 875-125 MG tablet Take 1 tablet by mouth 2 (two) times daily.  05/30/15   Sharman Cheek, MD    Allergies Shellfish allergy  No family history on file.  Social History Social History  Substance Use Topics  . Smoking status: Never Smoker  . Smokeless tobacco: Not on file  . Alcohol use No    Review of Systems  Constitutional: Negative for fever. Eyes: Negative for visual changes. ENT: Negative for sore throat. Neck: No neck pain  Cardiovascular: + chest pain. Respiratory:+ shortness of breath. Gastrointestinal: Negative for abdominal pain, vomiting or diarrhea. Genitourinary: Negative for dysuria. Musculoskeletal: Negative for back pain. Skin: Negative for rash. Neurological: Negative for headaches, weakness or numbness. Psych: No SI or HI  ____________________________________________   PHYSICAL EXAM:  VITAL SIGNS: ED Triage Vitals  Enc Vitals Group     BP 08/15/16 1246 (!) 150/87     Pulse Rate 08/15/16 1246 72     Resp 08/15/16 1246 18     Temp 08/15/16 1246 99.3 F (37.4 C)     Temp Source 08/15/16 1246 Oral     SpO2 08/15/16 1246 96 %     Weight 08/15/16 1247 (!) 305 lb (138.3 kg)     Height 08/15/16 1247 5\' 2"  (1.575 m)     Head Circumference --      Peak Flow --      Pain Score 08/15/16 1247 7     Pain Loc --      Pain Edu? --      Excl. in GC? --     Constitutional: Alert and oriented. Well appearing and  in no apparent distress. HEENT:      Head: Normocephalic and atraumatic.         Eyes: Conjunctivae are normal. Sclera is non-icteric. EOMI. PERRL      Mouth/Throat: Mucous membranes are moist.       Neck: Supple with no signs of meningismus. Cardiovascular: Regular rate and rhythm. No murmurs, gallops, or rubs. 2+ symmetrical distal pulses are present in all extremities. No JVD. Respiratory: Normal respiratory effort. Lungs are clear to auscultation bilaterally. No wheezes, crackles, or rhonchi.  Gastrointestinal: Obese, non tender, and non distended with positive bowel sounds. No rebound or  guarding. Genitourinary: No CVA tenderness. Musculoskeletal: Nontender with normal range of motion in all extremities. No edema, cyanosis, or erythema of extremities. Neurologic: Normal speech and language. Face is symmetric. Moving all extremities. No gross focal neurologic deficits are appreciated. Skin: Skin is warm, dry and intact. No rash noted. Psychiatric: Mood and affect are normal. Speech and behavior are normal.  ____________________________________________   LABS (all labs ordered are listed, but only abnormal results are displayed)  Labs Reviewed  BASIC METABOLIC PANEL - Abnormal; Notable for the following:       Result Value   BUN 27 (*)    Creatinine, Ser 1.55 (*)    GFR calc non Af Amer 46 (*)    GFR calc Af Amer 53 (*)    All other components within normal limits  CBC - Abnormal; Notable for the following:    HCT 39.5 (*)    RDW 15.4 (*)    All other components within normal limits  HEPATIC FUNCTION PANEL - Abnormal; Notable for the following:    Bilirubin, Direct <0.1 (*)    All other components within normal limits  TROPONIN I  BRAIN NATRIURETIC PEPTIDE  LIPASE, BLOOD  TROPONIN I   ____________________________________________  EKG  ED ECG REPORT I, Nita Sickle, the attending physician, personally viewed and interpreted this ECG.  Normal sinus rhythm, rate of 78, normal intervals, normal axis, LVH, no ST elevations or depressions. Unchanged from prior ____________________________________________  RADIOLOGY  CXR: Negative  ____________________________________________   PROCEDURES  Procedure(s) performed: None Procedures Critical Care performed:  None ____________________________________________   INITIAL IMPRESSION / ASSESSMENT AND PLAN / ED COURSE   66 y.o. male with a history of obstructive sleep apnea on CPAP, hypertension, chronic kidney disease, obesity who presents for evaluation of 2 months of DOE and occasional episodes of CP  with exertion that resolve at rest. Patient came in at the request of his primary care doctor. Has no symptoms at this time. His EKG is unchanged from prior showing LVH but no evidence of ischemia. Troponin is negative, BNP is within normal limits, chest x-ray with no findings of pulmonary edema. Presentation is concerning for stable angina. Patient has not seen a cardiologist or has had a stress test many years. Will get a 2nd troponin and if negative and patient remains CP free will dc with close f/u with Cardiology.  Clinical Course as of Aug 15 1720  Mon Aug 15, 2016  1657 Patient remains asymptomatic. Troponin 2 is negative. Patient does not wish to be admitted to the hospital. Discussed with Dr. Darrold Junker, cardiology on call who can see patient this week for close follow up and outpatient stress test. I discussed with patient that he has moderate risk based on his Heart score and therefore I wanted to admit him however since he wants to go home, he must follow up closely with cardiology.  Also recommended he return to the emergency room if he has an episode of chest pain that doesn't go away when he rests, chest pain at rest, or more severe episode of CP and SOB. Patient is agreement. Will dc home at this time.  [CV]  1720 Patient's wife is now at the bedside and convince patient to be admitted to the hospital for a stress test in house. We'll discuss with the hospitalist for admission.  [CV]    Clinical Course User Index [CV] Nita Sicklearolina Yaeko Fazekas, MD    Pertinent labs & imaging results that were available during my care of the patient were reviewed by me and considered in my medical decision making (see chart for details).    ____________________________________________   FINAL CLINICAL IMPRESSION(S) / ED DIAGNOSES  Final diagnoses:  Chest pain, unspecified type  Dyspnea on exertion      NEW MEDICATIONS STARTED DURING THIS VISIT:  New Prescriptions   No medications on file      Note:  This document was prepared using Dragon voice recognition software and may include unintentional dictation errors.    Nita Sicklearolina Cheynne Virden, MD 08/15/16 [phone]

## 2016-08-16 ENCOUNTER — Observation Stay (HOSPITAL_BASED_OUTPATIENT_CLINIC_OR_DEPARTMENT_OTHER): Payer: Medicare HMO

## 2016-08-16 ENCOUNTER — Observation Stay (HOSPITAL_BASED_OUTPATIENT_CLINIC_OR_DEPARTMENT_OTHER)
Admit: 2016-08-16 | Discharge: 2016-08-16 | Disposition: A | Discharge status: Home / Self Care | Payer: Medicare HMO | Attending: Internal Medicine | Admitting: Internal Medicine

## 2016-08-16 DIAGNOSIS — R079 Chest pain, unspecified: Secondary | ICD-10-CM | POA: Diagnosis not present

## 2016-08-16 DIAGNOSIS — R0600 Dyspnea, unspecified: Secondary | ICD-10-CM | POA: Diagnosis not present

## 2016-08-16 DIAGNOSIS — R0789 Other chest pain: Secondary | ICD-10-CM | POA: Diagnosis not present

## 2016-08-16 DIAGNOSIS — I509 Heart failure, unspecified: Secondary | ICD-10-CM | POA: Diagnosis not present

## 2016-08-16 DIAGNOSIS — N183 Chronic kidney disease, stage 3 (moderate): Secondary | ICD-10-CM | POA: Diagnosis not present

## 2016-08-16 DIAGNOSIS — G4730 Sleep apnea, unspecified: Secondary | ICD-10-CM | POA: Diagnosis not present

## 2016-08-16 DIAGNOSIS — R0602 Shortness of breath: Secondary | ICD-10-CM | POA: Diagnosis not present

## 2016-08-16 LAB — LIPID PANEL
CHOL/HDL RATIO: 4.7 ratio
Cholesterol: 168 mg/dL (ref 0–200)
HDL: 36 mg/dL — AB (ref >40)
LDL CALC: 93 mg/dL (ref 0–99)
Triglycerides: 196 mg/dL — ABNORMAL HIGH (ref <150)
VLDL: 39 mg/dL (ref 0–40)

## 2016-08-16 LAB — BASIC METABOLIC PANEL
Anion gap: 6 (ref 5–15)
BUN: 26 mg/dL — ABNORMAL HIGH (ref 6–20)
CALCIUM: 8.9 mg/dL (ref 8.9–10.3)
CO2: 27 mmol/L (ref 22–32)
Chloride: 104 mmol/L (ref 101–111)
Creatinine, Ser: 1.8 mg/dL — ABNORMAL HIGH (ref 0.61–1.24)
GFR calc Af Amer: 44 mL/min — ABNORMAL LOW (ref >60)
GFR, EST NON AFRICAN AMERICAN: 38 mL/min — AB (ref >60)
GLUCOSE: 103 mg/dL — AB (ref 65–99)
Potassium: 3.8 mmol/L (ref 3.5–5.1)
SODIUM: 137 mmol/L (ref 135–145)

## 2016-08-16 LAB — TROPONIN I

## 2016-08-16 MED ORDER — TECHNETIUM TC 99M TETROFOSMIN IV KIT
32.6640 | PACK | Freq: Once | INTRAVENOUS | Status: AC | PRN
  Administered 2016-08-16: 32.664 via INTRAVENOUS

## 2016-08-16 MED ORDER — OXYCODONE-ACETAMINOPHEN 5-325 MG PO TABS
1.0000 | ORAL_TABLET | Freq: Four times a day (QID) | ORAL | Status: DC | PRN
  Administered 2016-08-16 – 2016-08-17 (×2): 1 via ORAL
  Filled 2016-08-16 (×2): qty 1

## 2016-08-16 NOTE — Progress Notes (Signed)
*  PRELIMINARY RESULTS* Echocardiogram 2D Echocardiogram has been performed.  Wyatt Hernandez, Wyatt Hernandez 08/16/2016, 11:16 AM

## 2016-08-16 NOTE — Progress Notes (Signed)
[facility] [place] [facility] [address] [facility] [address] [facility]   PATIENT NAME: Wyatt Hernandez    MR#:  [id number]  DATE OF BIRTH:  October 22, 1950  SUBJECTIVE:  CHIEF COMPLAINT:   Chief Complaint  Patient presents with  . Shortness of Breath   - admitted with chest pressure and dyspnea - Improved with lasix. myoview done this AM  REVIEW OF SYSTEMS:  Review of Systems  Constitutional: Negative for chills, fever and malaise/fatigue.  HENT: Negative for ear discharge, ear pain, hearing loss and nosebleeds.   Eyes: Negative for blurred vision.  Respiratory: Negative for cough, shortness of breath and wheezing.   Cardiovascular: Negative for chest pain, palpitations and leg swelling.  Gastrointestinal: Negative for abdominal pain, constipation, diarrhea, nausea and vomiting.  Genitourinary: Negative for dysuria and urgency.  Musculoskeletal: Negative for myalgias.  Neurological: Negative for dizziness, sensory change, speech change, focal weakness, seizures and headaches.  Psychiatric/Behavioral: Negative for depression.    DRUG ALLERGIES:   Allergies  Allergen Reactions  . Shellfish Allergy Nausea And Vomiting    VITALS:  Blood pressure 131/79, pulse 71, temperature 98.4 F (36.9 C), temperature source Oral, resp. rate 20, height 5\' 2"  (1.575 m), weight (!) 136.9 kg (301 lb 11.2 oz), SpO2 98 %.  PHYSICAL EXAMINATION:  Physical Exam  GENERAL:  66 y.o.-year-old morbidly patient sitting in the bed with no acute distress.  EYES: Pupils equal, round, reactive to light and accommodation. No scleral icterus. Extraocular muscles intact.  HEENT: Head atraumatic, normocephalic. Oropharynx and nasopharynx clear.  NECK:  Supple, no jugular venous distention. No thyroid enlargement, no tenderness.  LUNGS: Normal breath sounds bilaterally, no wheezing, rales,rhonchi or crepitation. No use of accessory muscles of respiration.  CARDIOVASCULAR: S1, S2 normal. No murmurs, rubs, or gallops.    ABDOMEN: Soft, obese, nontender, nondistended. Bowel sounds present. No organomegaly or mass.  EXTREMITIES: No pedal edema, cyanosis, or clubbing.  NEUROLOGIC: Cranial nerves II through XII are intact. Muscle strength 5/5 in all extremities. Sensation intact. Gait not checked.  PSYCHIATRIC: The patient is alert and oriented x 3.  SKIN: No obvious rash, lesion, or ulcer.    LABORATORY PANEL:   CBC  Recent Labs Lab 08/15/16 1254  WBC 5.6  HGB 13.0  HCT 39.5*  PLT 280   ------------------------------------------------------------------------------------------------------------------  Chemistries   Recent Labs Lab 08/15/16 1254 08/16/16 0420  NA 139 137  K 4.1 3.8  CL 104 104  CO2 28 27  GLUCOSE 90 103*  BUN 27* 26*  CREATININE 1.55* 1.80*  CALCIUM 9.1 8.9  AST 20  --   ALT 17  --   ALKPHOS 48  --   BILITOT 0.6  --    ------------------------------------------------------------------------------------------------------------------  Cardiac Enzymes  Recent Labs Lab 08/16/16 0420  TROPONINI <0.03   ------------------------------------------------------------------------------------------------------------------  RADIOLOGY:  Dg Chest 2 View  Result Date: 08/15/2016 CLINICAL DATA:  Shortness of breath for 2 months. Lower extremity swelling. EXAM: CHEST  2 VIEW COMPARISON:  05/30/2015 FINDINGS: The heart size and mediastinal contours are within normal limits. Both lungs are clear. No evidence of pneumothorax or pleural effusion. Mild thoracic spine degenerative changes again noted . IMPRESSION: No active cardiopulmonary disease. Electronically Signed   By: Myles Rosenthal M.D.   On: 08/15/2016 13:17   Dg Knee 1-2 Views Right  Result Date: 08/15/2016 CLINICAL DATA:  Acute onset of right anterior knee swelling below the patella. Initial encounter. EXAM: RIGHT KNEE - 1-2 VIEW COMPARISON:  None. FINDINGS: There is no evidence of fracture or  dislocation. The joint spaces are  preserved. Mild marginal osteophyte formation is noted at all 3 compartments. Tibial spine and wall osteophytes are seen, with a tiny degenerative osseous fragment adjacent to the tibial spine. No significant joint effusion is seen. Mild diffuse soft tissue swelling is noted anterior and inferior to the patellar tendon. IMPRESSION: 1. No evidence of fracture or dislocation. 2. Mild osteoarthritis noted. Electronically Signed   By: Roanna RaiderJeffery  Chang M.D.   On: 08/15/2016 18:23    EKG:   Orders placed or performed during the hospital encounter of 08/15/16  . EKG 12-Lead  . EKG 12-Lead  . ED EKG within 10 minutes  . ED EKG within 10 minutes    ASSESSMENT AND PLAN:   66 year old male with past medical history significant for CK D stage III following up with the known nephrologists, hypertension, sleep apnea on CPAP, gout, heart failure presents to the hospital secondary to worsening shortness of breath and chest pressure.  #1 chest pressure with dyspnea-likely acute on chronic diastolic CHF exacerbation. -Echocardiogram is pending. Has been noncompliant with salt intake recently. Diet counseling done. Received a dose of Lasix with improvement. Due to risk factors, Myoview was done this morning and results are pending at this time. -Echocardiogram is done and awaiting results to see his ejection fraction. -If stress test is normal, can be discharged today. -Lasix can be used only as needed. Low-sodium diet encouraged  #2 obstructive sleep apnea-continue CPAP at bedtime.  #3 hypertension-on lisinopril, Norvasc, metoprolol. Restart his hydrochlorothiazide at discharge.  #4 BPH-Flomax.  #5 depression-on Effexor  #6 DVT prophylaxis-on Lovenox    All the records are reviewed and case discussed with Care Management/Social Workerr. Management plans discussed with the patient, family and they are in agreement.  CODE STATUS: Full Code  TOTAL TIME TAKING CARE OF THIS PATIENT: 37 minutes.    POSSIBLE D/C TODAY OR TOMORROW, DEPENDING ON CLINICAL CONDITION.   Enid BaasKALISETTI,Athene Schuhmacher M.D on 08/16/2016 at 2:26 PM  Between 7am to 6pm - Pager - [phone]  After 6pm go to [email or web address] - password [email or web address]  [facility]  Office  [phone]  CC: Primary care physician; Leanna SatoMILES,LINDA M, MD

## 2016-08-16 NOTE — Care Management Obs Status (Signed)
MEDICARE OBSERVATION STATUS NOTIFICATION   Patient Details  Name: Wyatt Hernandez MRN: [id number] Date of Birth: [birth year]   Medicare Observation Status Notification Given:  Yes    Eber HongGreene, Wiley Magan R, RN 08/16/2016, 3:49 PM

## 2016-08-16 NOTE — Progress Notes (Signed)
Initial Nutrition Assessment  DOCUMENTATION CODES:   Obesity unspecified  INTERVENTION:  1. Monitor and encourage PO intake  NUTRITION DIAGNOSIS:   Inadequate oral intake related to poor appetite as evidenced by per patient/family report.  GOAL:   Patient will meet greater than or equal to 90% of their needs  MONITOR:   PO intake, I & O's, Labs, Weight trends  REASON FOR ASSESSMENT:   Malnutrition Screening Tool    ASSESSMENT:   Wyatt Hernandez  is a 66 y.o. male with a known history of  Morbid obesity, history of congestive heart failure type unknown, gout, essential hypertension, sleep apnea who states that he wears CPAP all the time and vitamin D deficiency presenting to the emergency room with complaining of shortness of breath and chest pressure  Spoke with Wyatt Hernandez at bedside. He reports recent weight loss from 331# - 303# Claims etiology of weight loss to be a pain that experiences in his lower stomach with PO intake, has been experiencing since November. Prevents him from eating large volumes of food. To overcome this he has been eating small meals throughout the day, but then states he is eating 3 meals per day with no snacks. Can't find any evidence of pain, or weight loss, in chart. Denies any chewing/swallowing issues. Denies nausea/vomiting/diarrhea/constipation. Ate 50% of his tray in ED last night. No documented meal completion thus far. Nutrition-Focused physical exam completed. Findings are no fat depletion, no muscle depletion, and no edema.   Labs and medications reviewed: Vitamin D, B12   Diet Order:  Diet Heart Room service appropriate? Yes; Fluid consistency: Thin  Skin:  Reviewed, no issues  Last BM:  PTA  Height:   Ht Readings from Last 1 Encounters:  08/15/16 5\' 2"  (1.575 m)    Weight:   Wt Readings from Last 1 Encounters:  08/15/16 (!) 301 lb 11.2 oz (136.9 kg)    Ideal Body Weight:  50 kg  BMI:  Body mass index is 55.18  kg/m.  Estimated Nutritional Needs:   Kcal:  2042-2200 calories  Protein:  >/= 137gm  Fluid:  >/= 2L  EDUCATION NEEDS:   No education needs identified at this time  Wyatt AnoWilliam M. Regena Delucchi, MS, RD LDN Inpatient Clinical Dietitian Pager [phone]

## 2016-08-16 NOTE — Progress Notes (Signed)
Patient complaining of 8/10 left shoulder pain. Patient has scheduled percocet given today at 1239, no PRN orders noted. Per pt" I take my percocet every 6 hours, or up to 4 pills a day. It just depends on how bad I am hurting". MD Anne HahnWillis notified. Verbal order given to modify order for every 6 hours as needed. Will administer as ordered.   Mayra NeerNesbitt, Jamaya Sleeth M

## 2016-08-17 ENCOUNTER — Observation Stay: Payer: Medicare HMO

## 2016-08-17 DIAGNOSIS — I509 Heart failure, unspecified: Secondary | ICD-10-CM | POA: Diagnosis not present

## 2016-08-17 DIAGNOSIS — G4730 Sleep apnea, unspecified: Secondary | ICD-10-CM | POA: Diagnosis not present

## 2016-08-17 DIAGNOSIS — N183 Chronic kidney disease, stage 3 (moderate): Secondary | ICD-10-CM | POA: Diagnosis not present

## 2016-08-17 DIAGNOSIS — E669 Obesity, unspecified: Secondary | ICD-10-CM | POA: Diagnosis not present

## 2016-08-17 LAB — NM MYOCAR MULTI W/SPECT W/WALL MOTION / EF
CSEPPHR: 93 {beats}/min
LV dias vol: 107 mL (ref 62–150)
LV sys vol: 41 mL
Percent HR: 59 %
Rest HR: 74 {beats}/min
TID: 1.02

## 2016-08-17 LAB — HEMOGLOBIN A1C
HEMOGLOBIN A1C: 5.6 % (ref 4.8–5.6)
Mean Plasma Glucose: 114 mg/dL

## 2016-08-17 MED ORDER — ASPIRIN EC 81 MG PO TBEC: 81.0000 mg | DELAYED_RELEASE_TABLET | Freq: Every day | ORAL | Status: DC

## 2016-08-17 MED ORDER — LISINOPRIL 20 MG PO TABS: 40.0000 mg | ORAL_TABLET | Freq: Every day | ORAL | 2 refills | Status: DC

## 2016-08-17 MED ORDER — ASPIRIN 81 MG PO TBEC: 81.0000 mg | DELAYED_RELEASE_TABLET | Freq: Every day | ORAL | 2 refills | Status: DC

## 2016-08-17 MED ORDER — ATORVASTATIN CALCIUM 20 MG PO TABS: 40.0000 mg | ORAL_TABLET | Freq: Every day | ORAL | Status: DC

## 2016-08-17 MED ORDER — LISINOPRIL 20 MG PO TABS: 20.0000 mg | ORAL_TABLET | Freq: Every day | ORAL | Status: DC

## 2016-08-17 MED ORDER — ISOSORBIDE MONONITRATE ER 30 MG PO TB24: 30.0000 mg | ORAL_TABLET | Freq: Every day | ORAL | Status: DC

## 2016-08-17 MED ORDER — ATORVASTATIN CALCIUM 40 MG PO TABS
40.0000 mg | ORAL_TABLET | Freq: Every day | ORAL | 2 refills | Status: DC
Start: 2016-08-17 — End: 2017-01-31

## 2016-08-17 MED ORDER — ISOSORBIDE MONONITRATE ER 30 MG PO TB24: 30.0000 mg | ORAL_TABLET | Freq: Every day | ORAL | 2 refills | Status: DC

## 2016-08-17 MED ORDER — LISINOPRIL 20 MG PO TABS: 20.0000 mg | ORAL_TABLET | Freq: Every day | ORAL | 2 refills | Status: DC

## 2016-08-17 MED ORDER — TECHNETIUM TC 99M TETROFOSMIN IV KIT
30.0250 | PACK | Freq: Once | INTRAVENOUS | Status: AC | PRN
  Administered 2016-08-17: 30.025 via INTRAVENOUS

## 2016-08-17 NOTE — Progress Notes (Signed)
Patient is discharge home in a stable condition, summary and f/u care given to both pt and family , verbalized understanding  

## 2016-08-17 NOTE — Discharge Summary (Signed)
[facility] -  at [facility]   PATIENT NAME: Wyatt Hernandez    MR#:  [id number]  DATE OF BIRTH:  [birth year]  DATE OF ADMISSION:  08/15/2016   ADMITTING PHYSICIAN: Auburn Bilberry, MD  DATE OF DISCHARGE: 08/17/16  PRIMARY CARE PHYSICIAN: Leanna Sato, MD   ADMISSION DIAGNOSIS:   Swelling [R60.9] Dyspnea on exertion [R06.09] Chest pain [R07.9] Chest pain, unspecified type [R07.9]  DISCHARGE DIAGNOSIS:   Active Problems:   Chest pain   SECONDARY DIAGNOSIS:   Past Medical History:  Diagnosis Date  . 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 CHF (congestive heart failure) (HCC)   . Gout   . Hypertension   . Renal disorder   . Sleep apnea   . Vitamin D deficiency     HOSPITAL COURSE:   66 year old male with past medical history significant for CK D stage III following up with the known nephrologists, hypertension, sleep apnea on CPAP, gout, heart failure presents to the hospital secondary to worsening shortness of breath and chest pressure.  #1 chest pressure with dyspnea-likely acute on chronic diastolic CHF exacerbation. -Echocardiogram showing severe LVH and diastolic dysfunction. Has been noncompliant with salt intake recently. Diet counseling done. Received a dose of Lasix with improvement.  -Due to risk factors, Myoview was done showing reversible apical changes- medication adjustments advised. -Lasix can be used only as needed. Low-sodium diet encouraged - discharged on asa, statin, Imdur and metoprolol  #2 obstructive sleep apnea-continue CPAP at bedtime.  #3 hypertension-on lisinopril, Norvasc, metoprolol, hydrochlorothiazide at discharge. Imdur added per cardiology recommendations F/u with cardiology as outpatient  #4 BPH-Flomax.  #5 depression-on Effexor  #6 CKD stage 3- follows with [facility] nephrology, follow up.  Stable for discharge today  DISCHARGE CONDITIONS:   Guarded  CONSULTS  OBTAINED:   None  DRUG ALLERGIES:   Allergies  Allergen Reactions  . Shellfish Allergy Nausea And Vomiting   DISCHARGE MEDICATIONS:   Allergies as of 08/17/2016      Reactions   Shellfish Allergy Nausea And Vomiting      Medication List    TAKE these medications   albuterol 108 (90 Base) MCG/ACT inhaler Commonly known as:  PROVENTIL HFA;VENTOLIN HFA Inhale 2 puffs into the lungs every 6 (six) hours as needed.   amLODipine 10 MG tablet Commonly known as:  NORVASC Take 10 mg by mouth daily.   aspirin 81 MG EC tablet Take 1 tablet (81 mg total) by mouth daily.   atorvastatin 40 MG tablet Commonly known as:  LIPITOR Take 1 tablet (40 mg total) by mouth daily at 6 PM.   cyanocobalamin 50 MCG tablet Take 1 tablet by mouth daily.   hydrochlorothiazide 12.5 MG capsule Commonly known as:  MICROZIDE Take 12.5 mg by mouth daily.   isosorbide mononitrate 30 MG 24 hr tablet Commonly known as:  IMDUR Take 1 tablet (30 mg total) by mouth daily. Start taking on:  08/18/2016   lisinopril 20 MG tablet Commonly known as:  PRINIVIL,ZESTRIL Take 1 tablet (20 mg total) by mouth daily. What changed:  medication strength  how much to take   metoprolol 50 MG tablet Commonly known as:  LOPRESSOR Take 50 mg by mouth daily.   oxyCODONE-acetaminophen 5-325 MG tablet Commonly known as:  PERCOCET/ROXICET Take 1 tablet by mouth daily.   tamsulosin 0.4 MG Caps capsule Commonly known as:  FLOMAX Take 0.4 mg by mouth daily.   tiZANidine 4 MG capsule  Commonly known as:  ZANAFLEX Take 4 mg by mouth 3 (three) times daily.   venlafaxine 37.5 MG tablet Commonly known as:  EFFEXOR Take 37.5 mg by mouth 2 (two) times daily.   Vitamin D (Cholecalciferol) 400 units Tabs Take 1 tablet by mouth daily.        DISCHARGE INSTRUCTIONS:   1. PCP f/u in 1-2 weeks 2. Cardiology f/u in 2 weeks  DIET:   Cardiac diet  ACTIVITY:   Activity as tolerated  OXYGEN:   Home Oxygen:  No.  Oxygen Delivery: room air  DISCHARGE LOCATION:   home   If you experience worsening of your admission symptoms, develop shortness of breath, life threatening emergency, suicidal or homicidal thoughts you must seek medical attention immediately by calling 911 or calling your MD immediately  if symptoms less severe.  You Must read complete instructions/literature along with all the possible adverse reactions/side effects for all the Medicines you take and that have been prescribed to you. Take any new Medicines after you have completely understood and accpet all the possible adverse reactions/side effects.   Please note  You were cared for by a hospitalist during your hospital stay. If you have any questions about your discharge medications or the care you received while you were in the hospital after you are discharged, you can call the unit and asked to speak with the hospitalist on call if the hospitalist that took care of you is not available. Once you are discharged, your primary care physician will handle any further medical issues. Please note that NO REFILLS for any discharge medications will be authorized once you are discharged, as it is imperative that you return to your primary care physician (or establish a relationship with a primary care physician if you do not have one) for your aftercare needs so that they can reassess your need for medications and monitor your lab values.    On the day of Discharge:  VITAL SIGNS:   Blood pressure 135/78, pulse 81, temperature 98.6 F (37 C), resp. rate 20, height 5\' 2"  (1.575 m), weight (!) 136.9 kg (301 lb 11.2 oz), SpO2 97 %.  PHYSICAL EXAMINATION:    GENERAL:  66 y.o.-year-old morbidly patient sitting in the bed with no acute distress.  EYES: Pupils equal, round, reactive to light and accommodation. No scleral icterus. Extraocular muscles intact.  HEENT: Head atraumatic, normocephalic. Oropharynx and nasopharynx clear.  NECK:   Supple, no jugular venous distention. No thyroid enlargement, no tenderness.  LUNGS: Normal breath sounds bilaterally, no wheezing, rales,rhonchi or crepitation. No use of accessory muscles of respiration.  CARDIOVASCULAR: S1, S2 normal. No murmurs, rubs, or gallops.  ABDOMEN: Soft, obese, nontender, nondistended. Bowel sounds present. No organomegaly or mass.  EXTREMITIES: No pedal edema, cyanosis, or clubbing.  NEUROLOGIC: Cranial nerves II through XII are intact. Muscle strength 5/5 in all extremities. Sensation intact. Gait not checked.  PSYCHIATRIC: The patient is alert and oriented x 3.  SKIN: No obvious rash, lesion, or ulcer.   DATA REVIEW:   CBC  Recent Labs Lab 08/15/16 1254  WBC 5.6  HGB 13.0  HCT 39.5*  PLT 280    Chemistries   Recent Labs Lab 08/15/16 1254 08/16/16 0420  NA 139 137  K 4.1 3.8  CL 104 104  CO2 28 27  GLUCOSE 90 103*  BUN 27* 26*  CREATININE 1.55* 1.80*  CALCIUM 9.1 8.9  AST 20  --   ALT 17  --   ALKPHOS 48  --  BILITOT 0.6  --      Microbiology Results  No results found for this or any previous visit.  RADIOLOGY:  Nm Myocar Multi W/spect W/wall Motion / Ef  Result Date: 08/17/2016  Low risk study.  There is a small in size, mild in severity, reversible defect involving the apical inferior and apical lateral segments, which may represent ischemia and less likely artifact.  The left ventricular ejection fraction is normal (55-65%).      Management plans discussed with the patient, family and they are in agreement.  CODE STATUS:     Code Status Orders        Start     Ordered   08/15/16 1847  Full code  Continuous     08/15/16 1846    Code Status History    Date Active Date Inactive Code Status Order ID Comments User Context   This patient has a current code status but no historical code status.    Advance Directive Documentation   Flowsheet Row Most Recent Value  Type of Advance Directive  Healthcare Power of  Attorney  Pre-existing out of facility DNR order (yellow form or pink MOST form)  No data  "MOST" Form in Place?  No data      TOTAL TIME TAKING CARE OF THIS PATIENT: 37 minutes.    Enid BaasKALISETTI,Ashiah Karpowicz M.D on 08/17/2016 at 2:07 PM  Between 7am to 6pm - Pager - [phone]  After 6pm go to [email or web address] - Social research officer, [email or web address] [email or web address]  [facility]  Office  [phone]  CC: Primary care physician; Leanna SatoMILES,LINDA M, MD   Note: This dictation was prepared with Dragon dictation along with smaller phrase technology. Any transcriptional errors that result from this process are unintentional.

## 2016-08-23 DIAGNOSIS — N183 Chronic kidney disease, stage 3 (moderate): Secondary | ICD-10-CM | POA: Diagnosis not present

## 2016-08-23 DIAGNOSIS — I519 Heart disease, unspecified: Secondary | ICD-10-CM | POA: Diagnosis not present

## 2016-08-23 DIAGNOSIS — R1084 Generalized abdominal pain: Secondary | ICD-10-CM | POA: Diagnosis not present

## 2016-08-23 DIAGNOSIS — I10 Essential (primary) hypertension: Secondary | ICD-10-CM | POA: Diagnosis not present

## 2016-08-31 ENCOUNTER — Ambulatory Visit (INDEPENDENT_AMBULATORY_CARE_PROVIDER_SITE_OTHER): Payer: Medicare HMO | Admitting: Internal Medicine

## 2016-08-31 ENCOUNTER — Encounter: Payer: Self-pay | Admitting: Internal Medicine

## 2016-08-31 VITALS — BP 120/78 | HR 91 | Ht 62.0 in | Wt 305.0 lb

## 2016-08-31 DIAGNOSIS — E782 Mixed hyperlipidemia: Secondary | ICD-10-CM | POA: Diagnosis not present

## 2016-08-31 DIAGNOSIS — I5030 Unspecified diastolic (congestive) heart failure: Secondary | ICD-10-CM | POA: Diagnosis not present

## 2016-08-31 DIAGNOSIS — R0602 Shortness of breath: Secondary | ICD-10-CM

## 2016-08-31 DIAGNOSIS — I10 Essential (primary) hypertension: Secondary | ICD-10-CM | POA: Diagnosis not present

## 2016-08-31 DIAGNOSIS — R079 Chest pain, unspecified: Secondary | ICD-10-CM

## 2016-08-31 LAB — ECHOCARDIOGRAM COMPLETE
Height: 62 in
WEIGHTICAEL: 4827.2 [oz_av]

## 2016-08-31 MED ORDER — METOPROLOL TARTRATE 50 MG PO TABS: 50.0000 mg | ORAL_TABLET | Freq: Two times a day (BID) | ORAL | 3 refills | Status: AC

## 2016-08-31 MED ORDER — ASPIRIN EC 81 MG PO TBEC: 81.0000 mg | DELAYED_RELEASE_TABLET | Freq: Every day | ORAL | 3 refills | Status: DC

## 2016-08-31 MED ORDER — RANITIDINE HCL 150 MG PO CAPS: 150.0000 mg | ORAL_CAPSULE | Freq: Two times a day (BID) | ORAL | 3 refills | Status: DC

## 2016-08-31 NOTE — Patient Instructions (Signed)
Medication Instructions:  Your physician has recommended you make the following change in your medication:  1- START taking Aspirin 81 mg by mouth once a day. 2- INCREASE Metoprolol to 50 mg by mouth two times a day. 3- START Ranitidine 150 mg by mouth two times a day.   Labwork: none  Testing/Procedures: none  Follow-Up: Your physician recommends that you schedule a follow-up appointment in: 3 MONTHS WITH DR END.  If you need a refill on your cardiac medications before your next appointment, please call your pharmacy.

## 2016-08-31 NOTE — Progress Notes (Signed)
New Outpatient Visit Date: 08/31/2016  Referring Provider: Leanna SatoLinda M Miles, MD [address] [place], [place] [id number]  Chief Complaint: Hospital follow-up with chest pain and shortness of breath  HPI:  [name] is a 66 y.o. year-old male with history of diastolic heart failure, hypertension, chronic kidney disease stage III, obstructive sleep apnea, who has been referred by Dr. Marvis MoellerMiles for evaluation of chest pain and shortness of breath. The patient was admitted on 08/15/16 with chest pain and shortness of breath. He describes a tightness across the chest that was present with minimal exertion. He also noted generalized malaise, stating that he felt "sick all over." Echocardiogram revealed normal LVEF with grade 2 diastolic dysfunction. Myocardial perfusion stress test demonstrated a small reversible apical lateral and apical inferior defect with preserved LVEF, consistent with a low risk study. Symptoms improved with diuresis. He was advised to follow a low-salt diet and presents for cardiology consultation with the aforementioned findings.  Since leaving the hospital, [name] has not had any further episodes of chest pain. However, he noted epigastric discomfort whenever he eats. He also has left arm pain from time to time, centered around the shoulder. This is worsened with certain arm movements as well as eating. He does not have any exertional symptoms. He continues to use Percocet as needed with intermittent control of the pain. Mr. Gean QuintWinstead's shortness of breath has improved, though he continues to have occasional exertional dyspnea. No reports an episode of significant shortness of breath last fall while playing golf. He is awaiting GI evaluation of his abdominal pain next month.  --------------------------------------------------------------------------------------------------  Cardiovascular History & Procedures: Cardiovascular Problems:  Chest pain with abnormal stress  test  Heart failure with preserved ejection fraction  Risk Factors:  Hypertension, male gender, and age greater than 5055  Cath/PCI:  None  CV Surgery:  None  EP Procedures and Devices:  None  Non-Invasive Evaluation(s):  Pharmacologic myocardial perfusion stress test (08/16/16): Low risk study with small in size, mild in severity, reversible defect involving the apical inferior and apical lateral segments, which may represent ischemia and less likely artifact. LVEF normal.  TTE (08/16/16): Normal LV size with mild LVH. LVEF 60-65% with grade 2 diastolic dysfunction. Mild biatrial enlargement. Normal RV size and function. No significant valvular abnormalities.  Recent CV Pertinent Labs: Lab Results  Component Value Date   CHOL 168 08/16/2016   HDL 36 (L) 08/16/2016   LDLCALC 93 08/16/2016   TRIG 196 (H) 08/16/2016   CHOLHDL 4.7 08/16/2016   BNP 22.0 08/15/2016   K 3.8 08/16/2016   K 3.7 08/30/2013   BUN 26 (H) 08/16/2016   BUN 25 (H) 08/30/2013   CREATININE 1.80 (H) 08/16/2016   CREATININE 1.93 (H) 08/30/2013    --------------------------------------------------------------------------------------------------  Past Medical History:  Diagnosis Date  . 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 CHF (congestive heart failure) (HCC)   . Gout   . Hypertension   . Renal disorder   . Sleep apnea   . Vitamin D deficiency     Past Surgical History:  Procedure Laterality Date  . HERNIA REPAIR      Outpatient Encounter Prescriptions as of 08/31/2016  Medication Sig  . amLODipine (NORVASC) 10 MG tablet Take 10 mg by mouth daily.  Marland Kitchen. atorvastatin (LIPITOR) 40 MG tablet Take 1 tablet (40 mg total) by mouth daily at 6 PM.  . cyanocobalamin 50 MCG tablet Take 1 tablet by mouth  daily.  . hydrochlorothiazide (MICROZIDE) 12.5 MG capsule Take 12.5 mg by mouth daily.  . isosorbide mononitrate (IMDUR) 30 MG 24 hr tablet Take 1 tablet (30 mg total)  by mouth daily.  Marland Kitchen lisinopril (PRINIVIL,ZESTRIL) 20 MG tablet Take 1 tablet (20 mg total) by mouth daily. (Patient not taking: Reported on 08/31/2016)  . metoprolol (LOPRESSOR) 50 MG tablet Take 50 mg by mouth daily.  Marland Kitchen oxyCODONE-acetaminophen (PERCOCET/ROXICET) 5-325 MG tablet Take 1 tablet by mouth daily.  . tamsulosin (FLOMAX) 0.4 MG CAPS capsule Take 0.4 mg by mouth daily.  . Vitamin D, Cholecalciferol, 400 units TABS Take 1 tablet by mouth daily.  . [DISCONTINUED] albuterol (PROVENTIL HFA;VENTOLIN HFA) 108 (90 Base) MCG/ACT inhaler Inhale 2 puffs into the lungs every 6 (six) hours as needed.  . [DISCONTINUED] aspirin EC 81 MG EC tablet Take 1 tablet (81 mg total) by mouth daily. (Patient not taking: Reported on 08/31/2016)  . [DISCONTINUED] tiZANidine (ZANAFLEX) 4 MG capsule Take 4 mg by mouth 3 (three) times daily.  . [DISCONTINUED] venlafaxine (EFFEXOR) 37.5 MG tablet Take 37.5 mg by mouth 2 (two) times daily.   No facility-administered encounter medications on file as of 08/31/2016.     Allergies: Shellfish allergy  Social History   Social History  . Marital status: Married    Spouse name: N/A  . Number of children: N/A  . Years of education: N/A   Occupational History  . Not on file.   Social History Main Topics  . Smoking status: Never Smoker  . Smokeless tobacco: Never Used  . Alcohol use 1.2 oz/week    2 Cans of beer per week  . Drug use: Yes     Comment: Pain pills   . Sexual activity: Not on file   Other Topics Concern  . Not on file   Social History Narrative  . No narrative on file    Family History  Problem Relation Age of Onset  . Heart failure Father   . Heart attack Father   . Hyperlipidemia Mother   . Hypertension Mother     Review of Systems: A 12-system review of systems was performed and was negative except as noted in the HPI.  --------------------------------------------------------------------------------------------------  Physical  Exam: BP 120/78 (BP Location: Right Arm, Patient Position: Sitting, Cuff Size: Normal)   Pulse 91   Ht 5\' 2"  (1.575 m)   Wt (!) 305 lb (138.3 kg)   BMI 55.79 kg/m   General:  Morbidly obese man, seated comfortably in the exam room. HEENT: No conjunctival pallor or scleral icterus.  Moist mucous membranes.  OP clear. Neck: Supple without lymphadenopathy or thyromegaly. JVP is difficult to assess due to body habitus No carotid bruit. Lungs: Normal work of breathing.  Clear to auscultation bilaterally without wheezes or crackles. Heart: Distant heart sounds without murmurs. Unable to appreciate PMI due to body habitus. Abd: Bowel sounds present.  Soft, NT/ND. Unable to assess HSM due to body habitus. Ext: Trace pretibial edema bilaterally.  Radial, PT, and DP pulses are 2+ bilaterally Skin: warm and dry without rash Neuro: CNIII-XII intact.  Strength and fine-touch sensation intact in upper and lower extremities bilaterally. Psych: Normal mood and affect.  EKG:  Normal sinus rhythm with LVH. No significant change from prior tracing on 08/15/16 (I have personally reviewed both tracings).  Lab Results  Component Value Date   WBC 5.6 08/15/2016   HGB 13.0 08/15/2016   HCT 39.5 (L) 08/15/2016   MCV 80.8 08/15/2016  PLT 280 08/15/2016    Lab Results  Component Value Date   NA 137 08/16/2016   K 3.8 08/16/2016   CL 104 08/16/2016   CO2 27 08/16/2016   BUN 26 (H) 08/16/2016   CREATININE 1.80 (H) 08/16/2016   GLUCOSE 103 (H) 08/16/2016   ALT 17 08/15/2016    Lab Results  Component Value Date   CHOL 168 08/16/2016   HDL 36 (L) 08/16/2016   LDLCALC 93 08/16/2016   TRIG 196 (H) 08/16/2016   CHOLHDL 4.7 08/16/2016    --------------------------------------------------------------------------------------------------  ASSESSMENT AND PLAN: Chest pain and shortness of breath Symptoms are likely multifactorial, including diastolic heart failure, morbid obesity, and possible  myocardial ischemia. Given post-prandial nature of the pain, underlying GI pathology is certainly a consideration. Recent myocardial perfusion stress test showed small, reversible apical inferior and apical lateral defect, which could reflect ischemia or artifact. Overall, study was low risk. Given his CKD and interval resolution of chest pain, we have agreed to medical therapy. We will start aspirin 81 mg daily and increase metoprolol tartrate to 50 mg BID. I have also recommended ranitidine 150 mg BID.  Heart failure with preserved ejection fraction Volume status is challenging to assess due to body habitus, though Mr. Meadow does not appear grossly volume overloaded. We will increase metoprolol tartrate to 50 mg BID for better heart rate and blood pressure control. He should continue the remainder of his medications.  Essential hypertension Blood pressure is reasonable today. We will increase metoprolol tartrate to 50 mg BID, primarly for heart rate control and potential anti-anginal effects.  Hyperlipidemia Continue high intensity statin therapy.  Follow-up: Return to clinic in 3 months.  Yvonne Kendall, MD 08/31/2016 9:11 AM

## 2016-09-01 DIAGNOSIS — E782 Mixed hyperlipidemia: Secondary | ICD-10-CM | POA: Insufficient documentation

## 2016-09-01 DIAGNOSIS — I5030 Unspecified diastolic (congestive) heart failure: Secondary | ICD-10-CM | POA: Insufficient documentation

## 2016-09-01 DIAGNOSIS — R0602 Shortness of breath: Secondary | ICD-10-CM | POA: Insufficient documentation

## 2016-09-01 DIAGNOSIS — I10 Essential (primary) hypertension: Secondary | ICD-10-CM | POA: Insufficient documentation

## 2016-09-20 DIAGNOSIS — Z1159 Encounter for screening for other viral diseases: Secondary | ICD-10-CM | POA: Diagnosis not present

## 2016-09-20 DIAGNOSIS — I519 Heart disease, unspecified: Secondary | ICD-10-CM | POA: Diagnosis not present

## 2016-09-20 DIAGNOSIS — Z23 Encounter for immunization: Secondary | ICD-10-CM | POA: Diagnosis not present

## 2016-09-20 DIAGNOSIS — I10 Essential (primary) hypertension: Secondary | ICD-10-CM | POA: Diagnosis not present

## 2016-09-20 DIAGNOSIS — N183 Chronic kidney disease, stage 3 (moderate): Secondary | ICD-10-CM | POA: Diagnosis not present

## 2016-09-29 ENCOUNTER — Other Ambulatory Visit: Payer: Self-pay | Admitting: Gastroenterology

## 2016-09-29 DIAGNOSIS — R1013 Epigastric pain: Secondary | ICD-10-CM | POA: Diagnosis not present

## 2016-09-29 DIAGNOSIS — R1084 Generalized abdominal pain: Secondary | ICD-10-CM

## 2016-09-29 DIAGNOSIS — Z6841 Body Mass Index (BMI) 40.0 and over, adult: Secondary | ICD-10-CM | POA: Diagnosis not present

## 2016-10-10 DIAGNOSIS — H401111 Primary open-angle glaucoma, right eye, mild stage: Secondary | ICD-10-CM | POA: Diagnosis not present

## 2016-10-14 DIAGNOSIS — M109 Gout, unspecified: Secondary | ICD-10-CM | POA: Diagnosis not present

## 2016-10-14 DIAGNOSIS — M545 Low back pain: Secondary | ICD-10-CM | POA: Diagnosis not present

## 2016-10-14 DIAGNOSIS — G894 Chronic pain syndrome: Secondary | ICD-10-CM | POA: Diagnosis not present

## 2016-10-14 DIAGNOSIS — Z79891 Long term (current) use of opiate analgesic: Secondary | ICD-10-CM | POA: Diagnosis not present

## 2016-10-18 ENCOUNTER — Ambulatory Visit
Admission: RE | Admit: 2016-10-18 | Discharge: 2016-10-18 | Disposition: A | Discharge status: Home / Self Care | Payer: Medicare HMO | Source: Ambulatory Visit | Attending: Gastroenterology | Admitting: Gastroenterology

## 2016-10-18 DIAGNOSIS — M47896 Other spondylosis, lumbar region: Secondary | ICD-10-CM | POA: Insufficient documentation

## 2016-10-18 DIAGNOSIS — R1084 Generalized abdominal pain: Secondary | ICD-10-CM | POA: Diagnosis not present

## 2016-10-18 DIAGNOSIS — M5136 Other intervertebral disc degeneration, lumbar region: Secondary | ICD-10-CM | POA: Diagnosis not present

## 2016-10-18 DIAGNOSIS — K5730 Diverticulosis of large intestine without perforation or abscess without bleeding: Secondary | ICD-10-CM | POA: Insufficient documentation

## 2016-10-18 DIAGNOSIS — K429 Umbilical hernia without obstruction or gangrene: Secondary | ICD-10-CM | POA: Diagnosis not present

## 2016-11-18 DIAGNOSIS — M5441 Lumbago with sciatica, right side: Secondary | ICD-10-CM | POA: Diagnosis not present

## 2016-11-18 DIAGNOSIS — M4726 Other spondylosis with radiculopathy, lumbar region: Secondary | ICD-10-CM | POA: Diagnosis not present

## 2016-11-18 DIAGNOSIS — M5442 Lumbago with sciatica, left side: Secondary | ICD-10-CM | POA: Diagnosis not present

## 2016-11-18 DIAGNOSIS — M533 Sacrococcygeal disorders, not elsewhere classified: Secondary | ICD-10-CM | POA: Diagnosis not present

## 2016-11-18 DIAGNOSIS — G8929 Other chronic pain: Secondary | ICD-10-CM | POA: Diagnosis not present

## 2016-11-29 DIAGNOSIS — H60502 Unspecified acute noninfective otitis externa, left ear: Secondary | ICD-10-CM | POA: Diagnosis not present

## 2016-11-30 ENCOUNTER — Ambulatory Visit: Payer: Medicare HMO | Admitting: Internal Medicine

## 2016-12-14 DIAGNOSIS — M461 Sacroiliitis, not elsewhere classified: Secondary | ICD-10-CM | POA: Diagnosis not present

## 2017-01-11 ENCOUNTER — Ambulatory Visit
Admission: RE | Admit: 2017-01-11 | Discharge: 2017-01-11 | Disposition: A | Discharge status: Home / Self Care | Payer: MEDICARE

## 2017-01-11 DIAGNOSIS — N183 Chronic kidney disease, stage 3 (moderate): Secondary | ICD-10-CM | POA: Diagnosis not present

## 2017-01-11 DIAGNOSIS — I129 Hypertensive chronic kidney disease with stage 1 through stage 4 chronic kidney disease, or unspecified chronic kidney disease: Secondary | ICD-10-CM | POA: Diagnosis not present

## 2017-01-18 ENCOUNTER — Ambulatory Visit
Admission: RE | Admit: 2017-01-18 | Discharge: 2017-01-18 | Payer: MEDICARE | Attending: Nephrology | Admitting: Nephrology

## 2017-01-18 DIAGNOSIS — N183 Chronic kidney disease, stage 3 (moderate): Secondary | ICD-10-CM | POA: Diagnosis not present

## 2017-01-18 MED ORDER — LISINOPRIL 40 MG TABLET
ORAL_TABLET | Freq: Every day | ORAL | 3 refills | 0.00000 days | Status: CP
Start: 2017-01-18 — End: ?

## 2017-01-20 DIAGNOSIS — M545 Low back pain: Secondary | ICD-10-CM | POA: Diagnosis not present

## 2017-01-20 DIAGNOSIS — G894 Chronic pain syndrome: Secondary | ICD-10-CM | POA: Diagnosis not present

## 2017-01-20 DIAGNOSIS — Z5181 Encounter for therapeutic drug level monitoring: Secondary | ICD-10-CM | POA: Diagnosis not present

## 2017-01-20 DIAGNOSIS — M109 Gout, unspecified: Secondary | ICD-10-CM | POA: Diagnosis not present

## 2017-01-20 DIAGNOSIS — Z79891 Long term (current) use of opiate analgesic: Secondary | ICD-10-CM | POA: Diagnosis not present

## 2017-01-25 ENCOUNTER — Telehealth: Payer: Self-pay | Admitting: *Deleted

## 2017-01-25 NOTE — Telephone Encounter (Signed)
Requesting surgical clearance:   1. Type of surgery: Colonoscopy/EGD  2. [facility]  3. Surgical date: 02/03/2017  4. Medications that need to be held: none     5. I will defer to: Dr End   Attn:Tabitha/Ronda Fax- [phone] Phone [phone]

## 2017-01-25 NOTE — Telephone Encounter (Signed)
Patient was last seen in February and cancelled his 3 month f/u scheduled for May. He will need to be seen in our office before cardiac clearance can be given. Thanks.  Yvonne Kendallhristopher Wendal Wilkie, MD [facility] Pager: [phone]

## 2017-01-25 NOTE — Telephone Encounter (Signed)
No answer to patient. Left message to call back to discuss and schedule f/u appt.   S/w Tabitha at [facility]. She verbalized understanding that patient needed to see Dr End prior to receiving clearance. She will also notify the patient to let him know. If appt cannot be made prior to scheduled procedure, then they can reschedule the colonoscopy/EGD.

## 2017-01-26 NOTE — Telephone Encounter (Signed)
Encounter routed to Tabitha at number provided.

## 2017-01-26 NOTE — Telephone Encounter (Signed)
Patient scheduled for January 31, 2017 at 3:40 pm for appt with Dr End.

## 2017-01-31 ENCOUNTER — Ambulatory Visit (INDEPENDENT_AMBULATORY_CARE_PROVIDER_SITE_OTHER): Payer: Medicare HMO | Admitting: Internal Medicine

## 2017-01-31 ENCOUNTER — Encounter: Payer: Self-pay | Admitting: Internal Medicine

## 2017-01-31 VITALS — BP 138/91 | HR 65 | Ht 63.0 in | Wt 316.2 lb

## 2017-01-31 DIAGNOSIS — Z01810 Encounter for preprocedural cardiovascular examination: Secondary | ICD-10-CM | POA: Diagnosis not present

## 2017-01-31 DIAGNOSIS — I10 Essential (primary) hypertension: Secondary | ICD-10-CM

## 2017-01-31 DIAGNOSIS — I5030 Unspecified diastolic (congestive) heart failure: Secondary | ICD-10-CM

## 2017-01-31 DIAGNOSIS — I2510 Atherosclerotic heart disease of native coronary artery without angina pectoris: Secondary | ICD-10-CM

## 2017-01-31 NOTE — Patient Instructions (Signed)
Medication Instructions:  Your physician recommends that you continue on your current medications as directed. Please refer to the Current Medication list given to you today.  Dr End recommends you speak with your PCP in regards to restarting your cholesterol medication.  Labwork: none  Testing/Procedures: none  Follow-Up: Your physician wants you to follow-up in: 6 MONTHS WITH DR END. You will receive a reminder letter in the mail two months in advance. If you don't receive a letter, please call our office to schedule the follow-up appointment.  Dr End will send a note to your GI doctor regarding clearance for your upcoming procedure.   If you need a refill on your cardiac medications before your next appointment, please call your pharmacy.

## 2017-01-31 NOTE — Progress Notes (Signed)
Follow-up Outpatient Visit Date: 01/31/2017  Primary Care Provider: Leanna SatoMiles, Linda M, MD [address] [place] [id number]  Chief Complaint: Preoperative cardiovascular risk assessment  HPI:  Mr. Wyatt Hernandez is a 66 y.o. year-old male with history of heart failure with preserved ejection fraction, hypertension, chronic kidney disease stage III, and obstructive sleep apnea, who presents for follow-up of shortness of breath and chest pain. I last saw him on 08/31/16 following a hospitalization for chest pain and shortness of breath. Inpatient workup was notable for low-risk stress test with possible small area of apical ischemia or artifact. Grade 2 diastolic dysfunction was noted by echo. We agreed to continue medical management, including addition of low-dose aspirin, escalation of metoprolol, and initiation of ranitidine. He did not follow-up as planned in 3 months.  Today, Mr. Wyatt Hernandez reports that he has not had any further chest pain. He has exertional dyspnea that is slightly worse compared with last year, though he also notes about a 15 pound weight gain. He continues to play golf regularly. He otherwise does not exercise. He denies edema, claudication, palpitations, and lightheadedness. Since our last visit, several of his medications were discontinued, including aspirin, isosorbide mononitrate, ranitidine, and atorvastatin, out of concern for worsening renal function. Mr. Wyatt Hernandez follows closely with his PCP and nephrologist.  Mr. Wyatt Hernandez has continued to have intermittent abdominal pain and low risk diarrhea. He is planning to undergo upper and lower endoscopy by gastroenterology as soon as possible for further evaluation. 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  --------------------------------------------------------------------------------------------------  Cardiovascular History & Procedures: Cardiovascular Problems:  Chest pain with abnormal stress test  Heart failure with preserved ejection fraction  Risk Factors:  Hypertension, male gender, and age greater than 3155  Cath/PCI:  None  CV Surgery:  None  EP Procedures and Devices:  None  Non-Invasive Evaluation(s):  Pharmacologic myocardial perfusion stress test (08/16/16): Low risk study with small in size, mild in severity, reversible defect involving the apical inferior and apical lateral segments, which may represent ischemia and less likely artifact. LVEF normal.  TTE (08/16/16): Normal LV size with mild LVH. LVEF 60-65% with grade 2 diastolic dysfunction. Mild biatrial enlargement. Normal RV size and function. No significant valvular abnormalities.  Recent CV Pertinent Labs: Lab Results  Component Value Date   CHOL 168 08/16/2016   HDL 36 (L) 08/16/2016   LDLCALC 93 08/16/2016   TRIG 196 (H) 08/16/2016   CHOLHDL 4.7 08/16/2016   BNP 22.0 08/15/2016   K 3.8 08/16/2016   K 3.7 08/30/2013   BUN 26 (H) 08/16/2016   BUN 25 (H) 08/30/2013   CREATININE 1.80 (H) 08/16/2016   CREATININE 1.93 (H) 08/30/2013    Past medical and surgical history were reviewed and updated in EPIC.  Current Meds  Medication Sig  . amLODipine (NORVASC) 10 MG tablet Take 10 mg by mouth daily.  . cyanocobalamin 50 MCG tablet Take 1 tablet by mouth daily.  . hydrochlorothiazide (MICROZIDE) 12.5 MG capsule Take 12.5 mg by mouth daily.  . metoprolol (LOPRESSOR) 50 MG tablet Take 1 tablet (50 mg total) by mouth 2 (two) times daily.  Marland Kitchen. oxyCODONE-acetaminophen (PERCOCET/ROXICET) 5-325 MG tablet Take 1 tablet by mouth daily.  . tamsulosin (FLOMAX) 0.4 MG CAPS capsule Take 0.4 mg by mouth daily.  . Vitamin D, Cholecalciferol, 400 units TABS Take 1 tablet by mouth daily.     Allergies: Shellfish allergy  Social  History   Social History  . Marital status: Married    Spouse name: N/A  . Number of children: N/A  . Years of education: N/A   Occupational History  . Not on file.   Social History Main Topics  . Smoking status: Never Smoker  . Smokeless tobacco: Never Used  . Alcohol use Yes     Comment: Very rare alcohol (~3 oz per month)  . Drug use: Yes    Types: Oxycodone     Comment: Pain pills   . Sexual activity: Not on file   Other Topics Concern  . Not on file   Social History Narrative  . No narrative on file    Family History  Problem Relation Age of Onset  . Heart failure Father   . Heart attack Father [id number]  . Hyperlipidemia Mother   . Hypertension Mother   . Heart failure Sister   . Heart attack Sister   . Heart attack Brother 65  . Heart attack Brother 65    Review of Systems: A 12-system review of systems was performed and was negative except as noted in the HPI.  --------------------------------------------------------------------------------------------------  Physical Exam: BP (!) 138/91 (BP Location: Left Arm, Patient Position: Sitting, Cuff Size: Large)   Pulse 65   Ht 5\' 3"  (1.6 m)   Wt (!) 316 lb 4 oz (143.5 kg)   BMI 56.02 kg/m   General:  Morbidly obese man, seated comfortably in the exam room. HEENT: No conjunctival pallor or scleral icterus. Moist mucous membranes.  OP clear. Neck: Supple without lymphadenopathy, thyromegaly, JVD, or HJR. Lungs: Normal work of breathing. Clear to auscultation bilaterally without wheezes or crackles. Heart: Distant heart sounds. Regular rate and rhythm without murmurs, rubs, or gallops. Unable to assess PMI due to body habitus. Abd: Bowel sounds present. Soft, NT/ND. Unable to assess HSM due to body habitus. Ext: Trace pretibial edema bilaterally. Radial, PT, and DP pulses are 2+ bilaterally. Skin: Warm and dry without rash.  EKG:  Normal sinus rhythm with borderline  LVH.  Lab Results  Component Value Date   WBC 5.6 08/15/2016   HGB 13.0 08/15/2016   HCT 39.5 (L) 08/15/2016   MCV 80.8 08/15/2016   PLT 280 08/15/2016    Lab Results  Component Value Date   NA 137 08/16/2016   K 3.8 08/16/2016   CL 104 08/16/2016   CO2 27 08/16/2016   BUN 26 (H) 08/16/2016   CREATININE 1.80 (H) 08/16/2016   GLUCOSE 103 (H) 08/16/2016   ALT 17 08/15/2016    Lab Results  Component Value Date   CHOL 168 08/16/2016   HDL 36 (L) 08/16/2016   LDLCALC 93 08/16/2016   TRIG 196 (H) 08/16/2016   CHOLHDL 4.7 08/16/2016    --------------------------------------------------------------------------------------------------  ASSESSMENT AND PLAN: Coronary artery disease without angina Mr. Wyatt Hernandez has not had any further chest pain since our last visit. His myocardial perfusion stress test showed a small area of possible apical ischemia versus artifact. Long-term, I would recommend restarting aspirin and statin therapy, though given low risk nature of his stress test, I will defer adding these agents back to Mr. Gean QuintWinstead's PCP and nephrologist. No further medication changes at this time, given lack of recurrent symptoms. Reinitiation of long-acting nitrate should also be considered if chest pain recurs.  Heart failure with preserved ejection fraction Mr. Wyatt Hernandez notes mildly increased exertional dyspnea compared with last year, which is likely multifactorial. He does not appear grossly volume overloaded, though assessment  is limited by his body habitus. I think it is reasonable will continue his current medications. I encouraged him to exercise, as tolerated with the hope of losing weight.  Hypertension Blood pressure is mildly elevated today, though it has been normal to borderline low at other physician visits. We will not make any medication changes today.  Preoperative cardiovascular risk assessment Mr. Kinney is planning to undergo upper and lower endoscopy for  assessment of recurrent abdominal pain and malodorous diarrhea. These are low risk procedures from a cardiac standpoint. Given the lack of unstable cardiac symptoms and low risk stress test in February, it is reasonable for Mr. Hu to proceed without additional cardiac testing and/or intervention.  Follow-up: Return to clinic in 6 months.  Yvonne Kendall, MD 02/01/2017 7:50 AM

## 2017-02-01 ENCOUNTER — Encounter: Payer: Self-pay | Admitting: Internal Medicine

## 2017-02-01 DIAGNOSIS — I2510 Atherosclerotic heart disease of native coronary artery without angina pectoris: Secondary | ICD-10-CM | POA: Insufficient documentation

## 2017-02-02 ENCOUNTER — Encounter: Payer: Self-pay | Admitting: *Deleted

## 2017-02-03 ENCOUNTER — Encounter
Admission: RE | Disposition: A | Discharge status: Home / Self Care | Payer: Self-pay | Source: Ambulatory Visit | Attending: Gastroenterology

## 2017-02-03 ENCOUNTER — Ambulatory Visit: Payer: Medicare HMO | Admitting: Anesthesiology

## 2017-02-03 ENCOUNTER — Ambulatory Visit
Admission: RE | Admit: 2017-02-03 | Discharge: 2017-02-03 | Disposition: A | Discharge status: Home / Self Care | Payer: Medicare HMO | Source: Ambulatory Visit | Attending: Gastroenterology | Admitting: Gastroenterology

## 2017-02-03 ENCOUNTER — Encounter: Payer: Self-pay | Admitting: Anesthesiology

## 2017-02-03 DIAGNOSIS — I2510 Atherosclerotic heart disease of native coronary artery without angina pectoris: Secondary | ICD-10-CM | POA: Insufficient documentation

## 2017-02-03 DIAGNOSIS — I509 Heart failure, unspecified: Secondary | ICD-10-CM | POA: Diagnosis not present

## 2017-02-03 DIAGNOSIS — R194 Change in bowel habit: Secondary | ICD-10-CM | POA: Insufficient documentation

## 2017-02-03 DIAGNOSIS — Z6841 Body Mass Index (BMI) 40.0 and over, adult: Secondary | ICD-10-CM | POA: Insufficient documentation

## 2017-02-03 DIAGNOSIS — K2930 Chronic superficial gastritis without bleeding: Secondary | ICD-10-CM | POA: Diagnosis not present

## 2017-02-03 DIAGNOSIS — K640 First degree hemorrhoids: Secondary | ICD-10-CM | POA: Insufficient documentation

## 2017-02-03 DIAGNOSIS — K2950 Unspecified chronic gastritis without bleeding: Secondary | ICD-10-CM | POA: Insufficient documentation

## 2017-02-03 DIAGNOSIS — K209 Esophagitis, unspecified: Secondary | ICD-10-CM | POA: Diagnosis not present

## 2017-02-03 DIAGNOSIS — R1011 Right upper quadrant pain: Secondary | ICD-10-CM | POA: Insufficient documentation

## 2017-02-03 DIAGNOSIS — R197 Diarrhea, unspecified: Secondary | ICD-10-CM | POA: Diagnosis not present

## 2017-02-03 DIAGNOSIS — I130 Hypertensive heart and chronic kidney disease with heart failure and stage 1 through stage 4 chronic kidney disease, or unspecified chronic kidney disease: Secondary | ICD-10-CM | POA: Diagnosis not present

## 2017-02-03 DIAGNOSIS — K529 Noninfective gastroenteritis and colitis, unspecified: Secondary | ICD-10-CM | POA: Diagnosis not present

## 2017-02-03 DIAGNOSIS — I110 Hypertensive heart disease with heart failure: Secondary | ICD-10-CM | POA: Diagnosis not present

## 2017-02-03 DIAGNOSIS — Z79899 Other long term (current) drug therapy: Secondary | ICD-10-CM | POA: Insufficient documentation

## 2017-02-03 DIAGNOSIS — K210 Gastro-esophageal reflux disease with esophagitis: Secondary | ICD-10-CM | POA: Diagnosis not present

## 2017-02-03 DIAGNOSIS — E559 Vitamin D deficiency, unspecified: Secondary | ICD-10-CM | POA: Diagnosis not present

## 2017-02-03 DIAGNOSIS — N183 Chronic kidney disease, stage 3 (moderate): Secondary | ICD-10-CM | POA: Diagnosis not present

## 2017-02-03 DIAGNOSIS — R1013 Epigastric pain: Secondary | ICD-10-CM | POA: Insufficient documentation

## 2017-02-03 DIAGNOSIS — Z91013 Allergy to seafood: Secondary | ICD-10-CM | POA: Diagnosis not present

## 2017-02-03 DIAGNOSIS — R1084 Generalized abdominal pain: Secondary | ICD-10-CM | POA: Insufficient documentation

## 2017-02-03 DIAGNOSIS — G4730 Sleep apnea, unspecified: Secondary | ICD-10-CM | POA: Diagnosis not present

## 2017-02-03 DIAGNOSIS — Z7951 Long term (current) use of inhaled steroids: Secondary | ICD-10-CM | POA: Insufficient documentation

## 2017-02-03 DIAGNOSIS — K648 Other hemorrhoids: Secondary | ICD-10-CM | POA: Diagnosis not present

## 2017-02-03 HISTORY — PX: COLONOSCOPY WITH PROPOFOL: SHX5780

## 2017-02-03 HISTORY — PX: ESOPHAGOGASTRODUODENOSCOPY (EGD) WITH PROPOFOL: SHX5813

## 2017-02-03 SURGERY — COLONOSCOPY WITH PROPOFOL
Anesthesia: General

## 2017-02-03 MED ORDER — EPHEDRINE SULFATE 50 MG/ML IJ SOLN
INTRAMUSCULAR | Status: DC | PRN
  Administered 2017-02-03: 10 mg via INTRAVENOUS
  Administered 2017-02-03: 5 mg via INTRAVENOUS

## 2017-02-03 MED ORDER — PROPOFOL 500 MG/50ML IV EMUL
INTRAVENOUS | Status: DC | PRN
  Administered 2017-02-03: 50 ug/kg/min via INTRAVENOUS

## 2017-02-03 MED ORDER — FENTANYL CITRATE (PF) 100 MCG/2ML IJ SOLN
INTRAMUSCULAR | Status: AC
  Filled 2017-02-03: qty 2

## 2017-02-03 MED ORDER — PROPOFOL 10 MG/ML IV BOLUS
INTRAVENOUS | Status: AC
  Filled 2017-02-03: qty 20

## 2017-02-03 MED ORDER — PROPOFOL 10 MG/ML IV BOLUS
INTRAVENOUS | Status: DC | PRN
  Administered 2017-02-03: 20 mg via INTRAVENOUS
  Administered 2017-02-03: 30 mg via INTRAVENOUS

## 2017-02-03 MED ORDER — MIDAZOLAM HCL 2 MG/2ML IJ SOLN
INTRAMUSCULAR | Status: AC
  Filled 2017-02-03: qty 2

## 2017-02-03 MED ORDER — LIDOCAINE HCL (PF) 2 % IJ SOLN
INTRAMUSCULAR | Status: DC | PRN
  Administered 2017-02-03: 50 mg

## 2017-02-03 MED ORDER — SODIUM CHLORIDE 0.9 % IV SOLN
INTRAVENOUS | Status: DC
  Administered 2017-02-03: 1000 mL via INTRAVENOUS

## 2017-02-03 MED ORDER — SODIUM CHLORIDE 0.9 % IV SOLN: INTRAVENOUS | Status: DC

## 2017-02-03 MED ORDER — FENTANYL CITRATE (PF) 100 MCG/2ML IJ SOLN
INTRAMUSCULAR | Status: DC | PRN
  Administered 2017-02-03 (×2): 25 ug via INTRAVENOUS
  Administered 2017-02-03: 50 ug via INTRAVENOUS

## 2017-02-03 MED ORDER — LIDOCAINE HCL (PF) 1 % IJ SOLN
INTRAMUSCULAR | Status: AC
  Filled 2017-02-03: qty 2

## 2017-02-03 MED ORDER — MIDAZOLAM HCL 5 MG/5ML IJ SOLN
INTRAMUSCULAR | Status: DC | PRN
  Administered 2017-02-03: 2 mg via INTRAVENOUS
  Administered 2017-02-03 (×2): 1 mg via INTRAVENOUS

## 2017-02-03 MED ORDER — GLYCOPYRROLATE 0.2 MG/ML IJ SOLN
INTRAMUSCULAR | Status: AC
  Filled 2017-02-03: qty 1

## 2017-02-03 NOTE — H&P (Signed)
Outpatient short stay form Pre-procedure 02/03/2017 9:49 AM Wyatt DeemMartin U Ervin Rothbauer MD  Primary Physician: Dr. Darreld McleanLinda Miles  Reason for visit:  EGD and colonoscopy  History of present illness:  Patient is a 66 year old male presenting today as above. Several months ago he went to the emergency room with epigastric pain. He was then evaluated by cardiology. He is been cleared for these procedures. He has been having issues with what feels like a bloating or swelling in the right upper quadrant as well as epigastric discomfort and dyspepsia. He does occasionally have from reflux. He was on a proton pump inhibitor but had to be taken off of this due to the thought that this may be affecting his kidney function. He takes no aspirin or blood thinning agents. He tolerated his prep well. He is currently on Zantac 150 mg twice daily.    Current Facility-Administered Medications:  .  0.9 %  sodium chloride infusion, , Intravenous, Continuous, Wyatt DeemSkulskie, Man Effertz U, MD, Last Rate: 20 mL/hr at 02/03/17 0941, 1,000 mL at 02/03/17 0941 .  0.9 %  sodium chloride infusion, , Intravenous, Continuous, Wyatt DeemSkulskie, Siriyah Ambrosius U, MD .  lidocaine (PF) (XYLOCAINE) 1 % injection, , , ,   Prescriptions Prior to Admission  Medication Sig Dispense Refill Last Dose  . amLODipine (NORVASC) 10 MG tablet Take 10 mg by mouth daily.   02/02/2017 at Unknown time  . cyanocobalamin 50 MCG tablet Take 1 tablet by mouth daily.   02/02/2017 at Unknown time  . fluticasone (FLONASE) 50 MCG/ACT nasal spray Place into both nostrils daily.   02/02/2017 at Unknown time  . hydrochlorothiazide (MICROZIDE) 12.5 MG capsule Take 12.5 mg by mouth daily.   02/02/2017 at Unknown time  . lisinopril (PRINIVIL,ZESTRIL) 40 MG tablet Take 40 mg by mouth daily.   02/02/2017 at Unknown time  . metoprolol (LOPRESSOR) 50 MG tablet Take 1 tablet (50 mg total) by mouth 2 (two) times daily. 180 tablet 3 02/02/2017 at Unknown time  . oxyCODONE-acetaminophen (PERCOCET/ROXICET) 5-325 MG  tablet Take 1 tablet by mouth daily.   02/02/2017 at Unknown time  . ranitidine (ZANTAC) 150 MG tablet Take 150 mg by mouth 2 (two) times daily.   02/02/2017 at Unknown time  . tadalafil (CIALIS) 20 MG tablet Take 20 mg by mouth daily as needed for erectile dysfunction.   02/02/2017 at Unknown time  . tamsulosin (FLOMAX) 0.4 MG CAPS capsule Take 0.4 mg by mouth daily.   02/02/2017 at Unknown time  . Vitamin D, Cholecalciferol, 400 units TABS Take 1 tablet by mouth daily.   02/02/2017 at Unknown time     Allergies  Allergen Reactions  . Shellfish Allergy Nausea And Vomiting     Past Medical History:  Diagnosis Date  . 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 CHF (congestive heart failure) (HCC)   . Gout   . Hypertension   . Renal disorder   . Sleep apnea   . Vitamin D deficiency     Review of systems:      Physical Exam    Heart and lungs: Regular rate and rhythm without rub or gallop, lungs are bilaterally clear.    HEENT: Normocephalic atraumatic eyes are anicteric    Other:     Pertinant exam for procedure: Soft mild discomfort palpation right upper quadrant. There are no masses or rebound. Is very difficult to palpate internal organs due to obesity.    Planned proceedures: EGD and colonoscopy  with indicated procedures. I have discussed the risks benefits and complications of procedures to include not limited to bleeding, infection, perforation and the risk of sedation and the patient wishes to proceed.    Wyatt DeemMartin U Jolanta Cabeza, MD Gastroenterology 02/03/2017  9:49 AM

## 2017-02-03 NOTE — Anesthesia Post-op Follow-up Note (Cosign Needed)
Anesthesia QCDR form completed.        

## 2017-02-03 NOTE — Transfer of Care (Signed)
Immediate Anesthesia Transfer of Care Note  Patient: Wyatt Hernandez  Procedure(s) Performed: Procedure(s): COLONOSCOPY WITH PROPOFOL (N/A) ESOPHAGOGASTRODUODENOSCOPY (EGD) WITH PROPOFOL (N/A)  Patient Location: PACU  Anesthesia Type:General  Level of Consciousness: sedated  Airway & Oxygen Therapy: Patient Spontanous Breathing and Patient connected to nasal cannula oxygen  Post-op Assessment: Report given to RN and Post -op Vital signs reviewed and stable  Post vital signs: Reviewed and stable  Last Vitals:  Vitals:   02/03/17 0903 02/03/17 1050  BP: (!) 161/80 (!) 88/62  Pulse: 77 88  Resp: 18 18  Temp: (!) 36.3 C (!) 36.3 C    Last Pain:  Vitals:   02/03/17 1050  TempSrc: Tympanic         Complications: No apparent anesthesia complications

## 2017-02-03 NOTE — Op Note (Signed)
[facility] Patient Name: Wyatt BrockCharlie Poteet Procedure Date: 02/03/2017 9:39 AM MRN: [id number] Account #: [id number] Date of Birth: Nov 27, 1950 Admit Type: Outpatient Age: [age] [facility] Gender: Male Note Status: Finalized Procedure:            Colonoscopy Indications:          Epigastric abdominal pain, Generalized abdominal pain,                        Abdominal pain in the right upper quadrant, Change in                        bowel habits, Chronic diarrhea Providers:            Christena DeemMartin U. Skulskie, MD Medicines:            Monitored Anesthesia Care Complications:        No immediate complications. Procedure:            Pre-Anesthesia Assessment:                       - ASA Grade Assessment: III - A patient with severe                        systemic disease.                       After obtaining informed consent, the colonoscope was                        passed under direct vision. Throughout the procedure,                        the patient's blood pressure, pulse, and oxygen                        saturations were monitored continuously. The                        Colonoscope was introduced through the anus and                        advanced to the the cecum, identified by appendiceal                        orifice and ileocecal valve. The colonoscopy was                        performed without difficulty. The patient tolerated the                        procedure well. The quality of the bowel preparation                        was fair. Findings:      The colon (entire examined portion) appeared normal. Biopsies for       histology were taken with a cold forceps from the right colon and left       colon for evaluation of microscopic colitis.      Non-bleeding internal hemorrhoids were found during retroflexion. The       hemorrhoids were  small and Grade I (internal hemorrhoids that do not       prolapse).      The digital  rectal exam was normal. Impression:           - Preparation of the colon was fair.                       - The entire examined colon is normal. Biopsied.                       - Non-bleeding internal hemorrhoids. Recommendation:       - Discharge patient to home.                       - Perform a hepatobiliary scan at appointment to be                        scheduled.                       - Perform a RUQ ultrasound at appointment to be                        scheduled.                       - Return to GI clinic in 3 weeks. Procedure Code(s):    --- Professional ---                       [id number], Colonoscopy, flexible; with biopsy, single or                        multiple Diagnosis Code(s):    --- Professional ---                       K64.0, First degree hemorrhoids                       R10.13, Epigastric pain                       R10.84, Generalized abdominal pain                       R10.11, Right upper quadrant pain                       R19.4, Change in bowel habit                       K52.9, Noninfective gastroenteritis and colitis,                        unspecified CPT copyright 2016 American Medical Association. All rights reserved. The codes documented in this report are preliminary and upon coder review may  be revised to meet current compliance requirements. Christena DeemMartin U Skulskie, MD 02/03/2017 10:50:42 AM This report has been signed electronically. Number of Addenda: 0 Note Initiated On: 02/03/2017 9:39 AM Scope Withdrawal Time: 0 hours 8 minutes 12 seconds  Total Procedure Duration: 0 hours 14 minutes 52 seconds       [facility]

## 2017-02-03 NOTE — Anesthesia Preprocedure Evaluation (Addendum)
Anesthesia Evaluation  Patient identified by MRN, date of birth, ID band Patient awake    Reviewed: Allergy & Precautions, NPO status , Patient's Chart, lab work & pertinent test results, reviewed documented beta blocker date and time   Airway Mallampati: III  TM Distance: >3 FB     Dental  (+) Upper Dentures, Lower Dentures   Pulmonary shortness of breath, sleep apnea ,           Cardiovascular hypertension, Pt. on medications + CAD and +CHF       Neuro/Psych    GI/Hepatic   Endo/Other  Morbid obesity  Renal/GU Renal disease     Musculoskeletal   Abdominal   Peds  Hematology   Anesthesia Other Findings   Reproductive/Obstetrics                            Anesthesia Physical Anesthesia Plan  ASA: III  Anesthesia Plan: General   Post-op Pain Management:    Induction: Intravenous  PONV Risk Score and Plan:   Airway Management Planned:   Additional Equipment:   Intra-op Plan:   Post-operative Plan:   Informed Consent: I have reviewed the patients History and Physical, chart, labs and discussed the procedure including the risks, benefits and alternatives for the proposed anesthesia with the patient or authorized representative who has indicated his/her understanding and acceptance.     Plan Discussed with: CRNA  Anesthesia Plan Comments:         Anesthesia Quick Evaluation

## 2017-02-03 NOTE — Anesthesia Postprocedure Evaluation (Signed)
Anesthesia Post Note  Patient: Wyatt Hernandez  Procedure(s) Performed: Procedure(s) (LRB): COLONOSCOPY WITH PROPOFOL (N/A) ESOPHAGOGASTRODUODENOSCOPY (EGD) WITH PROPOFOL (N/A)  Patient location during evaluation: Endoscopy Anesthesia Type: General Level of consciousness: awake and alert Pain management: pain level controlled Vital Signs Assessment: post-procedure vital signs reviewed and stable Respiratory status: spontaneous breathing, nonlabored ventilation, respiratory function stable and patient connected to nasal cannula oxygen Cardiovascular status: blood pressure returned to baseline and stable Postop Assessment: no signs of nausea or vomiting Anesthetic complications: no     Last Vitals:  Vitals:   02/03/17 1120 02/03/17 1130  BP:  126/76  Pulse: 86 85  Resp: 16 16  Temp:      Last Pain:  Vitals:   02/03/17 1050  TempSrc: Tympanic                 Carroll Ranney S

## 2017-02-03 NOTE — Op Note (Signed)
[facility] Gastroenterology Patient Name: Wyatt Hernandez Procedure Date: 02/03/2017 9:43 AM MRN: [id number] Account #: [id number] Date of Birth: 09-05-1950 Admit Type: Outpatient Age: [age] [facility] Gender: Male Note Status: Finalized Procedure:            Upper GI endoscopy Indications:          Epigastric abdominal pain, Abdominal pain in the right                        upper quadrant, Dyspepsia Providers:            Christena DeemMartin U. Derrel Moore, MD Referring MD:         Leanna SatoLinda M. Miles, MD (Referring MD) Medicines:            Monitored Anesthesia Care Complications:        No immediate complications. Procedure:            Pre-Anesthesia Assessment:                       - ASA Grade Assessment: III - A patient with severe                        systemic disease.                       After obtaining informed consent, the endoscope was                        passed under direct vision. Throughout the procedure,                        the patient's blood pressure, pulse, and oxygen                        saturations were monitored continuously. The Endoscope                        was introduced through the mouth, and advanced to the                        third part of duodenum. The patient tolerated the                        procedure well. The upper GI endoscopy was performed                        with moderate difficulty due to the patient's body                        habitus. Findings:      LA Grade B (one or more mucosal breaks greater than 5 mm, not extending       between the tops of two mucosal folds) esophagitis with no bleeding was       found. Biopsies were taken with a cold forceps for histology.      The exam of the esophagus was otherwise normal.      The entire examined stomach was normal. Biopsies were taken with a cold       forceps for histology.      The examined duodenum was normal.  The cardia and gastric fundus were normal on  retroflexion. Impression:           - LA Grade B reflux esophagitis. Biopsied.                       - Normal stomach. Biopsied.                       - Normal examined duodenum. Recommendation:       - No ibuprofen, naproxen, or other non-steroidal                        anti-inflammatory drugs.                       - Use Zantac (ranitidine) 150 mg PO BID daily. Procedure Code(s):    --- Professional ---                       [phone], Esophagogastroduodenoscopy, flexible, transoral;                        with biopsy, single or multiple Diagnosis Code(s):    --- Professional ---                       K21.0, Gastro-esophageal reflux disease with esophagitis                       R10.13, Epigastric pain                       R10.11, Right upper quadrant pain CPT copyright 2016 American Medical Association. All rights reserved. The codes documented in this report are preliminary and upon coder review may  be revised to meet current compliance requirements. Christena DeemMartin U [name], MD 02/03/2017 10:29:39 AM This report has been signed electronically. Number of Addenda: 0 Note Initiated On: 02/03/2017 9:43 AM      [facility]

## 2017-02-06 ENCOUNTER — Encounter: Payer: Self-pay | Admitting: Gastroenterology

## 2017-02-06 LAB — SURGICAL PATHOLOGY

## 2017-02-20 ENCOUNTER — Other Ambulatory Visit: Payer: Self-pay | Admitting: Gastroenterology

## 2017-02-20 DIAGNOSIS — R1084 Generalized abdominal pain: Secondary | ICD-10-CM

## 2017-03-02 ENCOUNTER — Ambulatory Visit
Admission: RE | Admit: 2017-03-02 | Discharge: 2017-03-02 | Disposition: A | Discharge status: Home / Self Care | Payer: Medicare HMO | Source: Ambulatory Visit | Attending: Gastroenterology | Admitting: Gastroenterology

## 2017-03-02 ENCOUNTER — Encounter
Admission: RE | Admit: 2017-03-02 | Discharge: 2017-03-02 | Disposition: A | Discharge status: Home / Self Care | Payer: Medicare HMO | Source: Ambulatory Visit | Attending: Gastroenterology | Admitting: Gastroenterology

## 2017-03-02 DIAGNOSIS — R1084 Generalized abdominal pain: Secondary | ICD-10-CM | POA: Diagnosis not present

## 2017-03-02 DIAGNOSIS — R109 Unspecified abdominal pain: Secondary | ICD-10-CM | POA: Diagnosis not present

## 2017-03-02 MED ORDER — TECHNETIUM TC 99M MEBROFENIN IV KIT
5.0000 | PACK | Freq: Once | INTRAVENOUS | Status: AC | PRN
  Administered 2017-03-02: 4.967 via INTRAVENOUS

## 2017-03-07 DIAGNOSIS — R197 Diarrhea, unspecified: Secondary | ICD-10-CM | POA: Diagnosis not present

## 2017-03-07 DIAGNOSIS — R1084 Generalized abdominal pain: Secondary | ICD-10-CM | POA: Diagnosis not present

## 2017-03-07 DIAGNOSIS — R194 Change in bowel habit: Secondary | ICD-10-CM | POA: Diagnosis not present

## 2017-03-14 DIAGNOSIS — R1084 Generalized abdominal pain: Secondary | ICD-10-CM | POA: Diagnosis not present

## 2017-03-14 DIAGNOSIS — R194 Change in bowel habit: Secondary | ICD-10-CM | POA: Diagnosis not present

## 2017-03-29 ENCOUNTER — Emergency Department: Payer: Medicare HMO

## 2017-03-29 ENCOUNTER — Inpatient Hospital Stay
Admission: EM | Admit: 2017-03-29 | Discharge: 2017-03-30 | DRG: 917 | Discharge status: Against Medical Advice | Payer: Medicare HMO | Attending: Internal Medicine | Admitting: Internal Medicine

## 2017-03-29 DIAGNOSIS — R4020 Unspecified coma: Secondary | ICD-10-CM | POA: Diagnosis not present

## 2017-03-29 DIAGNOSIS — Z79899 Other long term (current) drug therapy: Secondary | ICD-10-CM

## 2017-03-29 DIAGNOSIS — M109 Gout, unspecified: Secondary | ICD-10-CM | POA: Diagnosis present

## 2017-03-29 DIAGNOSIS — I130 Hypertensive heart and chronic kidney disease with heart failure and stage 1 through stage 4 chronic kidney disease, or unspecified chronic kidney disease: Secondary | ICD-10-CM | POA: Diagnosis not present

## 2017-03-29 DIAGNOSIS — Z6841 Body Mass Index (BMI) 40.0 and over, adult: Secondary | ICD-10-CM | POA: Diagnosis not present

## 2017-03-29 DIAGNOSIS — R55 Syncope and collapse: Secondary | ICD-10-CM | POA: Diagnosis not present

## 2017-03-29 DIAGNOSIS — I10 Essential (primary) hypertension: Secondary | ICD-10-CM | POA: Diagnosis not present

## 2017-03-29 DIAGNOSIS — F329 Major depressive disorder, single episode, unspecified: Secondary | ICD-10-CM | POA: Diagnosis present

## 2017-03-29 DIAGNOSIS — Z79891 Long term (current) use of opiate analgesic: Secondary | ICD-10-CM

## 2017-03-29 DIAGNOSIS — R42 Dizziness and giddiness: Secondary | ICD-10-CM | POA: Diagnosis not present

## 2017-03-29 DIAGNOSIS — T391X1A Poisoning by 4-Aminophenol derivatives, accidental (unintentional), initial encounter: Principal | ICD-10-CM | POA: Diagnosis present

## 2017-03-29 DIAGNOSIS — I2510 Atherosclerotic heart disease of native coronary artery without angina pectoris: Secondary | ICD-10-CM | POA: Diagnosis present

## 2017-03-29 DIAGNOSIS — N183 Chronic kidney disease, stage 3 (moderate): Secondary | ICD-10-CM | POA: Diagnosis present

## 2017-03-29 DIAGNOSIS — E559 Vitamin D deficiency, unspecified: Secondary | ICD-10-CM | POA: Diagnosis present

## 2017-03-29 DIAGNOSIS — E662 Morbid (severe) obesity with alveolar hypoventilation: Secondary | ICD-10-CM | POA: Diagnosis not present

## 2017-03-29 DIAGNOSIS — Z91013 Allergy to seafood: Secondary | ICD-10-CM

## 2017-03-29 DIAGNOSIS — R4182 Altered mental status, unspecified: Secondary | ICD-10-CM | POA: Diagnosis not present

## 2017-03-29 DIAGNOSIS — R404 Transient alteration of awareness: Secondary | ICD-10-CM | POA: Diagnosis not present

## 2017-03-29 DIAGNOSIS — T402X1A Poisoning by other opioids, accidental (unintentional), initial encounter: Secondary | ICD-10-CM | POA: Diagnosis present

## 2017-03-29 DIAGNOSIS — I5032 Chronic diastolic (congestive) heart failure: Secondary | ICD-10-CM | POA: Diagnosis not present

## 2017-03-29 DIAGNOSIS — Z7951 Long term (current) use of inhaled steroids: Secondary | ICD-10-CM | POA: Diagnosis not present

## 2017-03-29 DIAGNOSIS — G4733 Obstructive sleep apnea (adult) (pediatric): Secondary | ICD-10-CM | POA: Diagnosis not present

## 2017-03-29 DIAGNOSIS — T40601A Poisoning by unspecified narcotics, accidental (unintentional), initial encounter: Secondary | ICD-10-CM | POA: Diagnosis present

## 2017-03-29 DIAGNOSIS — G92 Toxic encephalopathy: Secondary | ICD-10-CM | POA: Diagnosis not present

## 2017-03-29 DIAGNOSIS — T40604A Poisoning by unspecified narcotics, undetermined, initial encounter: Secondary | ICD-10-CM | POA: Diagnosis not present

## 2017-03-29 DIAGNOSIS — M549 Dorsalgia, unspecified: Secondary | ICD-10-CM | POA: Diagnosis present

## 2017-03-29 DIAGNOSIS — I509 Heart failure, unspecified: Secondary | ICD-10-CM | POA: Diagnosis not present

## 2017-03-29 DIAGNOSIS — E1122 Type 2 diabetes mellitus with diabetic chronic kidney disease: Secondary | ICD-10-CM | POA: Diagnosis not present

## 2017-03-29 DIAGNOSIS — Z8249 Family history of ischemic heart disease and other diseases of the circulatory system: Secondary | ICD-10-CM | POA: Diagnosis not present

## 2017-03-29 DIAGNOSIS — G894 Chronic pain syndrome: Secondary | ICD-10-CM | POA: Diagnosis present

## 2017-03-29 DIAGNOSIS — Z8349 Family history of other endocrine, nutritional and metabolic diseases: Secondary | ICD-10-CM

## 2017-03-29 DIAGNOSIS — R69 Illness, unspecified: Secondary | ICD-10-CM | POA: Diagnosis not present

## 2017-03-29 DIAGNOSIS — G9340 Encephalopathy, unspecified: Secondary | ICD-10-CM | POA: Diagnosis not present

## 2017-03-29 DIAGNOSIS — E785 Hyperlipidemia, unspecified: Secondary | ICD-10-CM | POA: Diagnosis present

## 2017-03-29 DIAGNOSIS — R531 Weakness: Secondary | ICD-10-CM | POA: Diagnosis not present

## 2017-03-29 LAB — CBC WITH DIFFERENTIAL/PLATELET
BASOS ABS: 0.1 10*3/uL (ref 0–0.1)
BASOS PCT: 1 %
EOS PCT: 5 %
Eosinophils Absolute: 0.4 10*3/uL (ref 0–0.7)
HCT: 39.1 % — ABNORMAL LOW (ref 40.0–52.0)
Hemoglobin: 13 g/dL (ref 13.0–18.0)
Lymphocytes Relative: 30 %
Lymphs Abs: 2.1 10*3/uL (ref 1.0–3.6)
MCH: 26.5 pg (ref 26.0–34.0)
MCHC: 33.2 g/dL (ref 32.0–36.0)
MCV: 79.6 fL — AB (ref 80.0–100.0)
MONO ABS: 0.8 10*3/uL (ref 0.2–1.0)
Monocytes Relative: 11 %
Neutro Abs: 3.7 10*3/uL (ref 1.4–6.5)
Neutrophils Relative %: 53 %
PLATELETS: 321 10*3/uL (ref 150–440)
RBC: 4.92 MIL/uL (ref 4.40–5.90)
RDW: 15.5 % — AB (ref 11.5–14.5)
WBC: 7 10*3/uL (ref 3.8–10.6)

## 2017-03-29 LAB — BLOOD GAS, VENOUS
Acid-Base Excess: 2.5 mmol/L — ABNORMAL HIGH (ref 0.0–2.0)
Bicarbonate: 29.3 mmol/L — ABNORMAL HIGH (ref 20.0–28.0)
O2 Saturation: 59.9 %
PATIENT TEMPERATURE: 37
PO2 VEN: 33 mmHg (ref 32.0–45.0)
pCO2, Ven: 53 mmHg (ref 44.0–60.0)
pH, Ven: 7.35 (ref 7.250–7.430)

## 2017-03-29 LAB — HEPATIC FUNCTION PANEL
ALT: 13 U/L — ABNORMAL LOW (ref 17–63)
AST: 17 U/L (ref 15–41)
Albumin: 3.9 g/dL (ref 3.5–5.0)
Alkaline Phosphatase: 55 U/L (ref 38–126)
BILIRUBIN TOTAL: 0.6 mg/dL (ref 0.3–1.2)
Total Protein: 7.8 g/dL (ref 6.5–8.1)

## 2017-03-29 LAB — ETHANOL: Alcohol, Ethyl (B): <10 mg/dL (ref <10)

## 2017-03-29 LAB — AMMONIA: AMMONIA: 36 umol/L — AB (ref 9–35)

## 2017-03-29 LAB — BASIC METABOLIC PANEL
ANION GAP: 7 (ref 5–15)
BUN: 24 mg/dL — AB (ref 6–20)
CALCIUM: 9.3 mg/dL (ref 8.9–10.3)
CO2: 28 mmol/L (ref 22–32)
CREATININE: 1.68 mg/dL — AB (ref 0.61–1.24)
Chloride: 104 mmol/L (ref 101–111)
GFR calc Af Amer: 48 mL/min — ABNORMAL LOW (ref >60)
GFR, EST NON AFRICAN AMERICAN: 41 mL/min — AB (ref >60)
GLUCOSE: 92 mg/dL (ref 65–99)
Potassium: 3.9 mmol/L (ref 3.5–5.1)
Sodium: 139 mmol/L (ref 135–145)

## 2017-03-29 LAB — BRAIN NATRIURETIC PEPTIDE: B NATRIURETIC PEPTIDE 5: 15 pg/mL (ref 0.0–100.0)

## 2017-03-29 LAB — TSH: TSH: 1.425 u[IU]/mL (ref 0.350–4.500)

## 2017-03-29 LAB — TROPONIN I

## 2017-03-29 MED ORDER — POLYETHYLENE GLYCOL 3350 17 G PO PACK: 17.0000 g | PACK | Freq: Every day | ORAL | Status: DC | PRN

## 2017-03-29 MED ORDER — IBUPROFEN 400 MG PO TABS: 400.0000 mg | ORAL_TABLET | Freq: Four times a day (QID) | ORAL | Status: DC | PRN

## 2017-03-29 MED ORDER — ACETAMINOPHEN 325 MG PO TABS: 650.0000 mg | ORAL_TABLET | Freq: Four times a day (QID) | ORAL | Status: DC | PRN

## 2017-03-29 MED ORDER — ONDANSETRON HCL 4 MG PO TABS: 4.0000 mg | ORAL_TABLET | Freq: Four times a day (QID) | ORAL | Status: DC | PRN

## 2017-03-29 MED ORDER — ALBUTEROL SULFATE (2.5 MG/3ML) 0.083% IN NEBU: 2.5000 mg | INHALATION_SOLUTION | RESPIRATORY_TRACT | Status: DC | PRN

## 2017-03-29 MED ORDER — SODIUM CHLORIDE 0.9% FLUSH
3.0000 mL | Freq: Two times a day (BID) | INTRAVENOUS | Status: DC
  Administered 2017-03-30 (×2): 3 mL via INTRAVENOUS

## 2017-03-29 MED ORDER — ACETAMINOPHEN 650 MG RE SUPP
650.0000 mg | Freq: Four times a day (QID) | RECTAL | Status: DC | PRN
Start: 2017-03-29 — End: 2017-03-30

## 2017-03-29 MED ORDER — NALOXONE HCL 2 MG/2ML IJ SOSY
0.4000 mg | PREFILLED_SYRINGE | Freq: Once | INTRAMUSCULAR | Status: AC
  Administered 2017-03-29: 0.4 mg via INTRAVENOUS
  Filled 2017-03-29: qty 2

## 2017-03-29 MED ORDER — ONDANSETRON HCL 4 MG/2ML IJ SOLN: 4.0000 mg | Freq: Four times a day (QID) | INTRAMUSCULAR | Status: DC | PRN

## 2017-03-29 MED ORDER — NALOXONE HCL 2 MG/2ML IJ SOSY
3.0000 mg/h | PREFILLED_SYRINGE | INTRAMUSCULAR | Status: DC
  Administered 2017-03-29 – 2017-03-30 (×2): 3 mg/h via INTRAVENOUS
  Filled 2017-03-29 (×2): qty 4

## 2017-03-29 MED ORDER — NALOXONE HCL 2 MG/2ML IJ SOSY
1.6000 mg | PREFILLED_SYRINGE | Freq: Once | INTRAMUSCULAR | Status: AC
  Administered 2017-03-29: 1.6 mg via INTRAVENOUS

## 2017-03-29 MED ORDER — NALOXONE HCL 2 MG/2ML IJ SOSY
2.0000 mg | PREFILLED_SYRINGE | Freq: Once | INTRAMUSCULAR | Status: AC
  Administered 2017-03-29: 2 mg via INTRAVENOUS

## 2017-03-29 MED ORDER — ENOXAPARIN SODIUM 40 MG/0.4ML ~~LOC~~ SOLN
40.0000 mg | SUBCUTANEOUS | Status: DC
  Administered 2017-03-30: 40 mg via SUBCUTANEOUS
  Filled 2017-03-29: qty 0.4

## 2017-03-29 NOTE — ED Triage Notes (Addendum)
Pt arrives to ED via [facility] from home with c/o unresponsiveness. EMS reports pt was found on floor at home by family; they attempted to drag him to the car to bring him to the ER but was unable d/t the pt's large size. EMS was called for transport to ER. EMS states the pt "answers appropriately sometimes, and doesn't make sense at other times". Pt arrives confused, lethargic; RR even, regular and unlabored, but with occasional snoring respirations. Dr Lamont Snowball at bedside upon pt's arrival to ED. EMS vital signs: initial BP 110/60, improved to 118/75 on recheck; NSR with HR 60-70s; CO2 38; 95% O2 on RA.

## 2017-03-29 NOTE — H&P (Signed)
[employer] [facility] - [place] [facility]   PATIENT NAME: Wyatt Hernandez    MR#:  [id number]  DATE OF BIRTH:  08/21/1950  DATE OF ADMISSION:  03/29/2017  PRIMARY CARE PHYSICIAN: Leanna Sato, MD   REQUESTING/REFERRING PHYSICIAN: Dr. Lamont Snowball  CHIEF COMPLAINT:   Chief Complaint  Patient presents with  . Loss of Consciousness    HISTORY OF PRESENT ILLNESS:  Wyatt Hernandez  is a 66 y.o. male with a known history of Chronic diastolic congestive heart failure, sleep apnea, chronic back pain on Percocet presented to the emergency room through EMS after family found the patient unresponsive on the floor. They tried to get him in to the car but due to his morbid obesity they were unsuccessful. Here in the emergency room patient responded well to Narcan but responds only lasting 10-15 minutes. At this time patient wakes up to tactile stimulus. Falls back to sleep. Confused. History obtained from family at bedside, ED staff and reviewing old records. Wife mentions that he has been complaining of increasing back pain over the last few days. She is unsure if he took any extra Percocet pills.  PAST MEDICAL HISTORY:   Past Medical History:  Diagnosis Date  . 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 CHF (congestive heart failure) (HCC)   . Gout   . Hypertension   . Renal disorder   . Sleep apnea   . Vitamin D deficiency     PAST SURGICAL HISTORY:   Past Surgical History:  Procedure Laterality Date  . COLONOSCOPY WITH PROPOFOL N/A 02/03/2017   Procedure: COLONOSCOPY WITH PROPOFOL;  Surgeon: Christena Deem, MD;  Location: [facility] ENDOSCOPY;  Service: Endoscopy;  Laterality: N/A;  . ESOPHAGOGASTRODUODENOSCOPY (EGD) WITH PROPOFOL N/A 02/03/2017   Procedure: ESOPHAGOGASTRODUODENOSCOPY (EGD) WITH PROPOFOL;  Surgeon: Christena Deem, MD;  Location: [facility] ENDOSCOPY;  Service: Endoscopy;  Laterality: N/A;  . HERNIA REPAIR    . TYMPANOPLASTY Left      SOCIAL HISTORY:   Social History  Substance Use Topics  . Smoking status: Never Smoker  . Smokeless tobacco: Never Used  . Alcohol use Yes     Comment: Very rare alcohol (~3 oz per month)    FAMILY HISTORY:   Family History  Problem Relation Age of Onset  . Heart failure Father   . Heart attack Father 56  . Hyperlipidemia Mother   . Hypertension Mother   . Heart failure Sister   . Heart attack Sister   . Heart attack Brother 65  . Heart attack Brother 65    DRUG ALLERGIES:   Allergies  Allergen Reactions  . Shellfish Allergy Nausea And Vomiting    REVIEW OF SYSTEMS:   Review of Systems  Unable to perform ROS: Mental status change    MEDICATIONS AT HOME:   Prior to Admission medications   Medication Sig Start Date End Date Taking? Authorizing Provider  amLODipine (NORVASC) 10 MG tablet Take 10 mg by mouth daily. 11/06/12   [provider]  cyanocobalamin 50 MCG tablet Take 1 tablet by mouth daily.    [provider]  fluticasone (FLONASE) 50 MCG/ACT nasal spray Place into both nostrils daily.    [provider]  hydrochlorothiazide (MICROZIDE) 12.5 MG capsule Take 12.5 mg by mouth daily. 11/06/12   [provider]  lisinopril (PRINIVIL,ZESTRIL) 40 MG tablet Take 40 mg by mouth daily.    [provider]  metoprolol (LOPRESSOR) 50 MG  tablet Take 1 tablet (50 mg total) by mouth 2 (two) times daily. 08/31/16   End, Cristal Deer, MD  oxyCODONE-acetaminophen (PERCOCET/ROXICET) 5-325 MG tablet Take 1 tablet by mouth daily.    [provider]  ranitidine (ZANTAC) 150 MG tablet Take 150 mg by mouth 2 (two) times daily.    [provider]  tadalafil (CIALIS) 20 MG tablet Take 20 mg by mouth daily as needed for erectile dysfunction.    [provider]  tamsulosin (FLOMAX) 0.4 MG CAPS capsule Take 0.4 mg by mouth daily.    [provider]  Vitamin D, Cholecalciferol, 400 units TABS Take 1 tablet by  mouth daily. 08/15/12   [provider]     VITAL SIGNS:  Blood pressure 114/85, pulse 66, temperature 98.1 F (36.7 C), temperature source Oral, resp. rate 14, height  (1.778 m), weight (!) 139.3 kg (307 lb), SpO2 95 %.  PHYSICAL EXAMINATION:  Physical Exam  GENERAL:  66 y.o.-year-old patient lying in the bed. Drowsy. Morbidly obese EYES: Pupils equal, round, reactive to light and accommodation. No scleral icterus. Extraocular muscles intact.  HEENT: Head atraumatic, normocephalic. Oropharynx and nasopharynx clear. No oropharyngeal erythema, moist oral mucosa  NECK:  Supple, no jugular venous distention. No thyroid enlargement, no tenderness.  LUNGS: Normal breath sounds bilaterally, no wheezing, rales, rhonchi. No use of accessory muscles of respiration.  CARDIOVASCULAR: S1, S2 normal. No murmurs, rubs, or gallops.  ABDOMEN: Soft, nontender, nondistended. Bowel sounds present. No organomegaly or mass.  EXTREMITIES: No pedal edema, cyanosis, or clubbing. + 2 pedal & radial pulses b/l.   NEUROLOGIC: Not following instructions. Moves all 4 extremities. PSYCHIATRIC: The patient is drowsy and confused  LABORATORY PANEL:   CBC  Recent Labs Lab 03/29/17 2103  WBC 7.0  HGB 13.0  HCT 39.1*  PLT 321   ------------------------------------------------------------------------------------------------------------------  Chemistries   Recent Labs Lab 03/29/17 2103  NA 139  K 3.9  CL 104  CO2 28  GLUCOSE 92  BUN 24*  CREATININE 1.68*  CALCIUM 9.3  AST 17  ALT 13*  ALKPHOS 55  BILITOT 0.6   ------------------------------------------------------------------------------------------------------------------  Cardiac Enzymes  Recent Labs Lab 03/29/17 2103  TROPONINI <0.03   ------------------------------------------------------------------------------------------------------------------  RADIOLOGY:  Ct Head Wo Contrast  Result Date: 03/29/2017 CLINICAL  DATA:  Found unresponsive on floor at home. EXAM: CT HEAD WITHOUT CONTRAST TECHNIQUE: Contiguous axial images were obtained from the base of the skull through the vertex without intravenous contrast. COMPARISON:  CT HEAD May 30, 2015 FINDINGS: BRAIN: No intraparenchymal hemorrhage, mass effect nor midline shift. The ventricles and sulci are normal for age. Patchy supratentorial white matter hypodensities within normal range for patient's age, though non-specific are most compatible with chronic small vessel ischemic disease. No acute large vascular territory infarcts. No abnormal extra-axial fluid collections. Basal cisterns are patent. VASCULAR: Mild calcific atherosclerosis of the carotid siphons. SKULL: No skull fracture. No significant scalp soft tissue swelling. SINUSES/ORBITS: RIGHT frontal sinus osteoma. Mild maxillary sinus mucosal thickening. Mastoid air cells are well aerated.The included ocular globes and orbital contents are non-suspicious. OTHER: None. IMPRESSION: Stable negative noncontrast CT HEAD for age. Electronically Signed   By: Awilda Metro M.D.   On: 03/29/2017 21:40   Dg Chest Port 1 View  Result Date: 03/29/2017 CLINICAL DATA:  Labored breathing, unresponsive EXAM: PORTABLE CHEST 1 VIEW COMPARISON:  08/15/2016 FINDINGS: Low lung volumes. No consolidation or effusion. Normal cardiomediastinal silhouette. No pneumothorax. IMPRESSION: Low lung volumes.  Negative for edema or infiltrate.  Electronically Signed   By: Jasmine Pang M.D.   On: 03/29/2017 21:20     IMPRESSION AND PLAN:   * Acute toxic encephalopathy due to likely Percocet overdose. Unintentional due to back pain. Responding well to Narcan. At this time we will bolus 1 more dose of Narcan and started Narcan drip. Admitted to stepdown unit. Discussed with intensivist Dr. Sung Amabile. BiPAP overnight.  * CKD stage III is stable  * Chronic diastolic congestive heart failure. Home medications will be continued.  *  Sleep apnea. We will start on BiPAP at night.  * Chronic back pain. Hold narcotic medications at this time.  All the records are reviewed and case discussed with ED provider. Management plans discussed with the patient, family and they are in agreement.  CODE STATUS: FULL CODE  TOTAL CC TIME TAKING CARE OF THIS PATIENT: 40 minutes.   Milagros Loll R M.D on 03/29/2017 at 11:16 PM  Between 7am to 6pm - Pager - [phone]  After 6pm go to [email or web address] - password [email or web address] [facility]  [facility]  Office  [phone]  CC: Primary care physician; Leanna Sato, MD  Note: This dictation was prepared with Dragon dictation along with smaller phrase technology. Any transcriptional errors that result from this process are unintentional.

## 2017-03-29 NOTE — ED Provider Notes (Signed)
[facility] Emergency Department Provider Note  ____________________________________________   First MD Initiated Contact with Patient 03/29/17 2106     (approximate)  I have reviewed the triage vital signs and the nursing notes.   HISTORY  Chief Complaint Loss of Consciousness  level V exemption history Limited by the patient's clinical condition  HPI Wyatt Hernandez is a 66 y.o. male who comes to the emergency department via EMS with altered mental status. According to report the patient was found unconscious on the ground by his family. He had a normal blood sugar en route. He does have a history of morbid obesity as well as diabetes mellitus and chronic kidney disease. He was given no medications en route.   Past Medical History:  Diagnosis Date  . 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 CHF (congestive heart failure) (HCC)   . Gout   . Hypertension   . Renal disorder   . Sleep apnea   . Vitamin D deficiency     Patient Active Problem List   Diagnosis Date Noted  . 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 Shortness of breath 09/01/2016  . (HFpEF) heart failure with preserved ejection fraction (HCC) 09/01/2016  . Hypertension 09/01/2016  . Mixed hyperlipidemia 09/01/2016  . Chest pain 08/15/2016    Past Surgical History:  Procedure Laterality Date  . COLONOSCOPY WITH PROPOFOL N/A 02/03/2017   Procedure: COLONOSCOPY WITH PROPOFOL;  Surgeon: Christena Deem, MD;  Location: [facility] ENDOSCOPY;  Service: Endoscopy;  Laterality: N/A;  . ESOPHAGOGASTRODUODENOSCOPY (EGD) WITH PROPOFOL N/A 02/03/2017   Procedure: ESOPHAGOGASTRODUODENOSCOPY (EGD) WITH PROPOFOL;  Surgeon: Christena Deem, MD;  Location: [facility] ENDOSCOPY;  Service: Endoscopy;  Laterality: N/A;  . HERNIA REPAIR    . TYMPANOPLASTY Left     Prior to Admission medications   Medication  Sig Start Date End Date Taking? Authorizing Provider  amLODipine (NORVASC) 10 MG tablet Take 10 mg by mouth daily. 11/06/12   [provider]  cyanocobalamin 50 MCG tablet Take 1 tablet by mouth daily.    [provider]  fluticasone (FLONASE) 50 MCG/ACT nasal spray Place into both nostrils daily.    [provider]  hydrochlorothiazide (MICROZIDE) 12.5 MG capsule Take 12.5 mg by mouth daily. 11/06/12   [provider]  lisinopril (PRINIVIL,ZESTRIL) 40 MG tablet Take 40 mg by mouth daily.    [provider]  metoprolol (LOPRESSOR) 50 MG tablet Take 1 tablet (50 mg total) by mouth 2 (two) times daily. 08/31/16   End, Cristal Deer, MD  oxyCODONE-acetaminophen (PERCOCET/ROXICET) 5-325 MG tablet Take 1 tablet by mouth daily.    [provider]  ranitidine (ZANTAC) 150 MG tablet Take 150 mg by mouth 2 (two) times daily.    [provider]  tadalafil (CIALIS) 20 MG tablet Take 20 mg by mouth daily as needed for erectile dysfunction.    [provider]  tamsulosin (FLOMAX) 0.4 MG CAPS capsule Take 0.4 mg by mouth daily.    [provider]  Vitamin D, Cholecalciferol, 400 units TABS Take 1 tablet by mouth daily. 08/15/12   [provider]    Allergies Shellfish allergy  Family History  Problem Relation Age of Onset  . Heart failure Father   . Heart attack Father 63  . Hyperlipidemia Mother   . Hypertension Mother   . Heart failure Sister   . Heart attack Sister   .  Heart attack Brother 65  . Heart attack Brother 65    Social History Social History  Substance Use Topics  . Smoking status: Never Smoker  . Smokeless tobacco: Never Used  . Alcohol use Yes     Comment: Very rare alcohol (~3 oz per month)    Review of Systems level V exemption history Limited by the patient's clinical condition ____________________________________________   PHYSICAL EXAM:  VITAL SIGNS: ED Triage Vitals [03/29/17 2103]    Enc Vitals Group     BP      Pulse      Resp      Temp      Temp src      SpO2 95 %     Weight      Height      Head Circumference      Peak Flow      Pain Score      Pain Loc      Pain Edu?      Excl. in GC?     Constitutional: somnolent, morbidly obese, sonorous respirations, responds to sternal rub Eyes: pupils 1 mm bilaterally Head: Atraumatic. Nose: No congestion/rhinnorhea. Mouth/Throat: No trismus Neck: No stridor.   Cardiovascular: Normal rate, regular rhythm. Grossly normal heart sounds.  Good peripheral circulation. Respiratory: sonorous respirations.  Lungs clear bilaterally Gastrointestinal: morbidly obese soft nontender Musculoskeletal: No lower extremity edema   Neurologic:  moves all 4 extremities Skin:  Skin is warm, dry and intact. No rash noted. Psychiatric: comatose    ____________________________________________   DIFFERENTIAL includes but not limited to  hypercapnia,  Hyperammonemia, hypoglycemia, intracerebral hemorrhage, opiate overdose ____________________________________________   LABS (all labs ordered are listed, but only abnormal results are displayed)  Labs Reviewed  BASIC METABOLIC PANEL - Abnormal; Notable for the following:       Result Value   BUN 24 (*)    Creatinine, Ser 1.68 (*)    GFR calc non Af Amer 41 (*)    GFR calc Af Amer 48 (*)    All other components within normal limits  HEPATIC FUNCTION PANEL - Abnormal; Notable for the following:    ALT 13 (*)    Bilirubin, Direct <0.1 (*)    All other components within normal limits  CBC WITH DIFFERENTIAL/PLATELET - Abnormal; Notable for the following:    HCT 39.1 (*)    MCV 79.6 (*)    RDW 15.5 (*)    All other components within normal limits  BLOOD GAS, VENOUS - Abnormal; Notable for the following:    Bicarbonate 29.3 (*)    Acid-Base Excess 2.5 (*)    All other components within normal limits  AMMONIA - Abnormal; Notable for the following:    Ammonia 36 (*)    All  other components within normal limits  ETHANOL  BRAIN NATRIURETIC PEPTIDE  TROPONIN I  TSH    blood work reviewed and interpreted by me shows slightly elevated CO2 but not enough to become comatose and slightly elevated ammonia but not enough for, __________________________________________  EKG  ED ECG REPORT I, Merrily Brittle, the attending physician, personally viewed and interpreted this ECG.  Date: 03/29/2017 EKG Time:  Rate: 66 Rhythm: normal sinus rhythm QRS Axis: normal Intervals: normal ST/T Wave abnormalities: concave up anterior ST elevation very mild with a notch consistent with benign early repolarization Narrative Interpretation: no evidence of acute ischemia, however very poor baseline  ____________________________________________  RADIOLOGY  head CT reviewed by me with no acute disease  Chest x-ray reviewed by mewith no acute disease ____________________________________________   PROCEDURES  Procedure(s) performed: no  Procedures  Critical Care performed: yes  CRITICAL CARE Performed by: Merrily Brittle   Total critical care time: 35 minutes  Critical care time was exclusive of separately billable procedures and treating other patients.  Critical care was necessary to treat or prevent imminent or life-threatening deterioration.  Critical care was time spent personally by me on the following activities: development of treatment plan with patient and/or surrogate as well as nursing, discussions with consultants, evaluation of patient's response to treatment, examination of patient, obtaining history from patient or surrogate, ordering and performing treatments and interventions, ordering and review of laboratory studies, ordering and review of radiographic studies, pulse oximetry and re-evaluation of patient's condition.   Observation: no ____________________________________________   INITIAL IMPRESSION / ASSESSMENT AND PLAN / ED  COURSE  Pertinent labs & imaging results that were available during my care of the patient were reviewed by me and considered in my medical decision making (see chart for details).  On arrival the patient is somnolent although pickwickian. He does respond to aggressive jaw thrust.  differential is broad but includes hypercapnia as well as azotemia. We will give a trial of BiPAP now while awaiting the results of his gas.     ----------------------------------------- 10:53 PM on 03/29/2017 -----------------------------------------  The patient was once again minimally responsive and not talking even with painful stimulus. His pupils were once again smaller. Given another 2 mg dose of naloxone which had a transient response. At this point the patient is back asleep and difficult to arouse. No other clear etiology of his symptoms so I will begin him on a narcan infusion and admit to the hospital. ____________________________________________   FINAL CLINICAL IMPRESSION(S) / ED DIAGNOSES  Final diagnoses:  Opiate overdose, accidental or unintentional, initial encounter      NEW MEDICATIONS STARTED DURING THIS VISIT:  New Prescriptions   No medications on file     Note:  This document was prepared using Dragon voice recognition software and may include unintentional dictation errors.     Merrily Brittle, MD 03/29/17 [phone]

## 2017-03-29 NOTE — ED Notes (Signed)
Pt is HIGH FALL RISK. Yellow wristband, yellow socks, and personal bed alarm placed on pt at this time.

## 2017-03-29 NOTE — ED Notes (Signed)
Per Dr Lamont Snowball, the remaining 1.6mg  of Naloxone given IVP at this time.

## 2017-03-29 NOTE — ED Notes (Signed)
Patient transported to CT at this time. 

## 2017-03-29 NOTE — Consult Note (Signed)
Name: Wyatt Hernandez MRN: [id number] DOB: Oct 10, 1950    ADMISSION DATE:  03/29/2017  CONSULTATION DATE:  03/29/17  REFERRING MD : Dr.Sudini  CHIEF COMPLAINT: unresponsiveness  BRIEF PATIENT DESCRIPTION:66 year old male with opioid overdose  SIGNIFICANT EVENTS  9/27 Patient admitted to the SDU with unintentional opioid overdose  STUDIES:  03/29/17 CT head>> No acute abnormality 08/16/16 ECHO>Left ventricle: The cavity size was normal. Wall thickness was   increased in a pattern of mild LVH. Systolic function was normal.   The estimated ejection fraction was in the range of 60% to 65%   HISTORY OF PRESENT ILLNESS:  Wyatt Hernandez is a 66 year old male with known history of Stage-3 Kidney disease,Hypertension,sleep apnea, CAD, Hypertension and Hyperlipidemia.  On 9/26 patient was found by the family members unresponsive. Family members attempted to drag him to the car to bring him to the hospital however were unable and therefore EMS was called to transport him to the ER. When patient arrived in the ED, he seemed to be lethargic and confused. Patient was given Narcan and he responded well to the medication but will last only for short duration and therefore was started on Narcan drip.  Patient has chronic pain issues and overdosed on percocet according to wife.  Wife is not aware of the number of pills he ingested.  Patient was admitted to the SDU on Narcan gtt.  PAST MEDICAL HISTORY :   has a past medical history of Benign hypertensive heart and CKD, 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), w CHF (HCC); CHF (congestive heart failure) (HCC); Gout; Hypertension; Renal disorder; Sleep apnea; and Vitamin D deficiency.  has a past surgical history that includes Hernia repair; Tympanoplasty (Left); Colonoscopy with propofol (N/A, 02/03/2017); and Esophagogastroduodenoscopy (egd) with propofol (N/A, 02/03/2017). Prior to Admission medications   Medication Sig Start Date End Date Taking? Authorizing Provider    amLODipine (NORVASC) 10 MG tablet Take 10 mg by mouth daily. 11/06/12   [provider]  cyanocobalamin 50 MCG tablet Take 1 tablet by mouth daily.    [provider]  fluticasone (FLONASE) 50 MCG/ACT nasal spray Place into both nostrils daily.    [provider]  hydrochlorothiazide (MICROZIDE) 12.5 MG capsule Take 12.5 mg by mouth daily. 11/06/12   [provider]  lisinopril (PRINIVIL,ZESTRIL) 40 MG tablet Take 40 mg by mouth daily.    [provider]  metoprolol (LOPRESSOR) 50 MG tablet Take 1 tablet (50 mg total) by mouth 2 (two) times daily. 08/31/16   End, Cristal Deer, MD  oxyCODONE-acetaminophen (PERCOCET/ROXICET) 5-325 MG tablet Take 1 tablet by mouth daily.    [provider]  ranitidine (ZANTAC) 150 MG tablet Take 150 mg by mouth 2 (two) times daily.    [provider]  tadalafil (CIALIS) 20 MG tablet Take 20 mg by mouth daily as needed for erectile dysfunction.    [provider]  tamsulosin (FLOMAX) 0.4 MG CAPS capsule Take 0.4 mg by mouth daily.    [provider]  Vitamin D, Cholecalciferol, 400 units TABS Take 1 tablet by mouth daily. 08/15/12   [provider]   Allergies  Allergen Reactions  . Shellfish Allergy Nausea And Vomiting    FAMILY HISTORY:  family history includes Heart attack in his sister; Heart attack (age of onset: 5) in his father; Heart attack (age of onset: 87) in his brother and brother; Heart failure in his father and sister; Hyperlipidemia in his mother; Hypertension in his mother. SOCIAL HISTORY:  reports  that he has never smoked. He has never used smokeless tobacco. He reports that he drinks alcohol. He reports that he uses drugs, including Oxycodone.  REVIEW OF SYSTEMS:   Constitutional: Negative for fever, chills, weight loss, malaise/fatigue and diaphoresis.  HENT: Negative for hearing loss, ear pain, nosebleeds, congestion, sore throat, neck pain, tinnitus and ear  discharge.   Eyes: Negative for blurred vision, double vision, photophobia, pain, discharge and redness.  Respiratory: Negative for cough, hemoptysis, sputum production, shortness of breath, wheezing and stridor.   Cardiovascular: Negative for chest pain, palpitations, orthopnea, claudication, leg swelling and PND.  Gastrointestinal: Negative for heartburn, nausea, vomiting, abdominal pain, diarrhea, constipation, blood in stool and melena.  Genitourinary: Negative for dysuria, urgency, frequency, hematuria and flank pain.  Musculoskeletal: Negative for myalgias, back pain, joint pain and falls.  Skin: Negative for itching and rash.  Neurological: Negative for dizziness, tingling, tremors, sensory change, speech change, focal weakness, seizures, loss of consciousness, weakness and headaches.  Endo/Heme/Allergies: Negative for environmental allergies and polydipsia. Does not bruise/bleed easily.  SUBJECTIVE: Patient is confused  VITAL SIGNS: Temp:  [98.1 F (36.7 C)] 98.1 F (36.7 C) (09/26 2105) Pulse Rate:  [63-72] 66 (09/26 2300) Resp:  [14-20] 14 (09/26 2300) BP: (109-142)/(69-85) 114/85 (09/26 2300) SpO2:  [93 %-100 %] 95 % (09/26 2300) FiO2 (%):  [28 %] 28 % (09/26 2257) Weight:  [139.3 kg (307 lb)] 139.3 kg (307 lb) (09/26 2106)  PHYSICAL EXAMINATION: General:  Morbidly obese African American Male,confused,found on bed Neuro: confused HEENT:  Atraumatic,Normocephalic, no JVD Cardiovascular:  S1S2,regular,no m/r/g Lungs: clear bilaterally, no wheezes,crackles,rhonchi noted Abdomen:  Obese, round,active bowel sounds Musculoskeletal:  No edema,cyanosis Skin:  Warm,dry and intact   Recent Labs Lab 03/29/17 2103  NA 139  K 3.9  CL 104  CO2 28  BUN 24*  CREATININE 1.68*  GLUCOSE 92    Recent Labs Lab 03/29/17 2103  HGB 13.0  HCT 39.1*  WBC 7.0  PLT 321   Ct Head Wo Contrast  Result Date: 03/29/2017 CLINICAL DATA:  Found unresponsive on floor at home. EXAM: CT  HEAD WITHOUT CONTRAST TECHNIQUE: Contiguous axial images were obtained from the base of the skull through the vertex without intravenous contrast. COMPARISON:  CT HEAD May 30, 2015 FINDINGS: BRAIN: No intraparenchymal hemorrhage, mass effect nor midline shift. The ventricles and sulci are normal for age. Patchy supratentorial white matter hypodensities within normal range for patient's age, though non-specific are most compatible with chronic small vessel ischemic disease. No acute large vascular territory infarcts. No abnormal extra-axial fluid collections. Basal cisterns are patent. VASCULAR: Mild calcific atherosclerosis of the carotid siphons. SKULL: No skull fracture. No significant scalp soft tissue swelling. SINUSES/ORBITS: RIGHT frontal sinus osteoma. Mild maxillary sinus mucosal thickening. Mastoid air cells are well aerated.The included ocular globes and orbital contents are non-suspicious. OTHER: None. IMPRESSION: Stable negative noncontrast CT HEAD for age. Electronically Signed   By: Awilda Metro M.D.   On: 03/29/2017 21:40   Dg Chest Port 1 View  Result Date: 03/29/2017 CLINICAL DATA:  Labored breathing, unresponsive EXAM: PORTABLE CHEST 1 VIEW COMPARISON:  08/15/2016 FINDINGS: Low lung volumes. No consolidation or effusion. Normal cardiomediastinal silhouette. No pneumothorax. IMPRESSION: Low lung volumes.  Negative for edema or infiltrate. Electronically Signed   By: Jasmine Pang M.D.   On: 03/29/2017 21:20    ASSESSMENT / PLAN: Acute Toxic Encephalopathy due to percocet overdose complicating CKD, OSA and likely OHS.   Acute on CKD Chronic Pain Syndrome Chronic  Diastolic Heart failure Morbid Obesity Hx of Depression Hx of Hypertension and Hyperlipidemia   Plan Bipap overnight; wean as tolerated.  Keep O2 Sats>94% Follow UDS Follow HIV antibody Blood ETOH<10 Continue Narcan prn Will closely monitor for signs of withdrawal Follow BMP Continue home dose  amlodipine/HCTZ/ Lisinopril/metoprolol  Deep Nicholos Johns, MD.   Board Certified in Internal Medicine, Pulmonary Medicine, Critical Care Medicine, and Sleep Medicine.  [facility] Office Number: [phone] Pager: [phone]  Santiago Glad, M.D.  Billy Fischer, M.D   03/29/2017, 11:19 PM

## 2017-03-30 ENCOUNTER — Emergency Department
Admission: EM | Admit: 2017-03-30 | Discharge: 2017-03-30 | Disposition: A | Discharge status: Home / Self Care | Payer: Medicare HMO | Attending: Emergency Medicine | Admitting: Emergency Medicine

## 2017-03-30 ENCOUNTER — Encounter: Payer: Self-pay | Admitting: Emergency Medicine

## 2017-03-30 DIAGNOSIS — N183 Chronic kidney disease, stage 3 (moderate): Secondary | ICD-10-CM | POA: Diagnosis not present

## 2017-03-30 DIAGNOSIS — Z79899 Other long term (current) drug therapy: Secondary | ICD-10-CM | POA: Diagnosis not present

## 2017-03-30 DIAGNOSIS — T40601A Poisoning by unspecified narcotics, accidental (unintentional), initial encounter: Secondary | ICD-10-CM

## 2017-03-30 DIAGNOSIS — R42 Dizziness and giddiness: Secondary | ICD-10-CM | POA: Insufficient documentation

## 2017-03-30 DIAGNOSIS — I130 Hypertensive heart and chronic kidney disease with heart failure and stage 1 through stage 4 chronic kidney disease, or unspecified chronic kidney disease: Secondary | ICD-10-CM | POA: Insufficient documentation

## 2017-03-30 DIAGNOSIS — I2510 Atherosclerotic heart disease of native coronary artery without angina pectoris: Secondary | ICD-10-CM | POA: Insufficient documentation

## 2017-03-30 DIAGNOSIS — I509 Heart failure, unspecified: Secondary | ICD-10-CM | POA: Insufficient documentation

## 2017-03-30 LAB — BLOOD GAS, ARTERIAL
ACID-BASE EXCESS: 1.7 mmol/L (ref 0.0–2.0)
Bicarbonate: 26.6 mmol/L (ref 20.0–28.0)
FIO2: 21
O2 SAT: 95.2 %
PATIENT TEMPERATURE: 37
PO2 ART: 76 mmHg — AB (ref 83.0–108.0)
pCO2 arterial: 42 mmHg (ref 32.0–48.0)
pH, Arterial: 7.41 (ref 7.350–7.450)

## 2017-03-30 LAB — URINE DRUG SCREEN, QUALITATIVE (ARMC ONLY)
AMPHETAMINES, UR SCREEN: NOT DETECTED
BARBITURATES, UR SCREEN: NOT DETECTED
BENZODIAZEPINE, UR SCRN: NOT DETECTED
Cannabinoid 50 Ng, Ur ~~LOC~~: NOT DETECTED
Cocaine Metabolite,Ur ~~LOC~~: NOT DETECTED
MDMA (Ecstasy)Ur Screen: NOT DETECTED
METHADONE SCREEN, URINE: NOT DETECTED
Opiate, Ur Screen: NOT DETECTED
Phencyclidine (PCP) Ur S: NOT DETECTED
Tricyclic, Ur Screen: NOT DETECTED

## 2017-03-30 LAB — BASIC METABOLIC PANEL
Anion gap: 8 (ref 5–15)
Anion gap: 11 (ref 5–15)
BUN: 23 mg/dL — ABNORMAL HIGH (ref 6–20)
BUN: 24 mg/dL — ABNORMAL HIGH (ref 6–20)
CALCIUM: 9.4 mg/dL (ref 8.9–10.3)
CHLORIDE: 104 mmol/L (ref 101–111)
CO2: 28 mmol/L (ref 22–32)
CO2: 25 mmol/L (ref 22–32)
CREATININE: 1.57 mg/dL — AB (ref 0.61–1.24)
Calcium: 9.9 mg/dL (ref 8.9–10.3)
Chloride: 103 mmol/L (ref 101–111)
Creatinine, Ser: 1.66 mg/dL — ABNORMAL HIGH (ref 0.61–1.24)
GFR calc Af Amer: 49 mL/min — ABNORMAL LOW (ref >60)
GFR calc non Af Amer: 45 mL/min — ABNORMAL LOW (ref >60)
GFR, EST AFRICAN AMERICAN: 52 mL/min — AB (ref >60)
GFR, EST NON AFRICAN AMERICAN: 42 mL/min — AB (ref >60)
Glucose, Bld: 122 mg/dL — ABNORMAL HIGH (ref 65–99)
Glucose, Bld: 127 mg/dL — ABNORMAL HIGH (ref 65–99)
POTASSIUM: 3.7 mmol/L (ref 3.5–5.1)
Potassium: 3.6 mmol/L (ref 3.5–5.1)
SODIUM: 140 mmol/L (ref 135–145)
Sodium: 139 mmol/L (ref 135–145)

## 2017-03-30 LAB — CBC
HCT: 43.7 % (ref 40.0–52.0)
HCT: 44.6 % (ref 40.0–52.0)
HEMOGLOBIN: 14.4 g/dL (ref 13.0–18.0)
Hemoglobin: 14.7 g/dL (ref 13.0–18.0)
MCH: 27 pg (ref 26.0–34.0)
MCH: 26.8 pg (ref 26.0–34.0)
MCHC: 32.9 g/dL (ref 32.0–36.0)
MCHC: 33 g/dL (ref 32.0–36.0)
MCV: 81.9 fL (ref 80.0–100.0)
MCV: 81.2 fL (ref 80.0–100.0)
PLATELETS: 303 10*3/uL (ref 150–440)
PLATELETS: 358 10*3/uL (ref 150–440)
RBC: 5.33 MIL/uL (ref 4.40–5.90)
RBC: 5.5 MIL/uL (ref 4.40–5.90)
RDW: 15.7 % — ABNORMAL HIGH (ref 11.5–14.5)
RDW: 15.7 % — AB (ref 11.5–14.5)
WBC: 6.5 10*3/uL (ref 3.8–10.6)
WBC: 8.5 10*3/uL (ref 3.8–10.6)

## 2017-03-30 LAB — GLUCOSE, CAPILLARY: Glucose-Capillary: 99 mg/dL (ref 65–99)

## 2017-03-30 LAB — MRSA PCR SCREENING: MRSA BY PCR: NEGATIVE

## 2017-03-30 LAB — TROPONIN I: Troponin I: <0.03 ng/mL (ref <0.03)

## 2017-03-30 LAB — ACETAMINOPHEN LEVEL

## 2017-03-30 MED ORDER — METOPROLOL TARTRATE 50 MG PO TABS
50.0000 mg | ORAL_TABLET | Freq: Two times a day (BID) | ORAL | Status: DC
  Administered 2017-03-30: 50 mg via ORAL
  Filled 2017-03-30: qty 1

## 2017-03-30 MED ORDER — NALOXONE HCL 0.4 MG/ML IJ SOLN
INTRAVENOUS | Status: DC
  Administered 2017-03-30 (×2): via INTRAVENOUS
  Filled 2017-03-30 (×10): qty 20

## 2017-03-30 MED ORDER — HYDROCHLOROTHIAZIDE 12.5 MG PO CAPS
12.5000 mg | ORAL_CAPSULE | Freq: Every day | ORAL | Status: DC
  Administered 2017-03-30: 12.5 mg via ORAL
  Filled 2017-03-30: qty 1

## 2017-03-30 MED ORDER — METOPROLOL TARTRATE 50 MG PO TABS: 50.0000 mg | ORAL_TABLET | Freq: Two times a day (BID) | ORAL | Status: DC

## 2017-03-30 MED ORDER — INFLUENZA VAC SPLIT QUAD 0.5 ML IM SUSY: 0.5000 mL | PREFILLED_SYRINGE | INTRAMUSCULAR | Status: DC

## 2017-03-30 MED ORDER — VITAMIN B-12 100 MCG PO TABS
50.0000 ug | ORAL_TABLET | Freq: Every day | ORAL | Status: DC
  Administered 2017-03-30: 50 ug via ORAL
  Filled 2017-03-30: qty 1

## 2017-03-30 MED ORDER — AMLODIPINE BESYLATE 10 MG PO TABS
10.0000 mg | ORAL_TABLET | Freq: Every day | ORAL | Status: DC
Start: 2017-03-30 — End: 2017-03-30
  Administered 2017-03-30: 10 mg via ORAL
  Filled 2017-03-30: qty 1

## 2017-03-30 MED ORDER — LISINOPRIL 20 MG PO TABS
40.0000 mg | ORAL_TABLET | Freq: Every day | ORAL | Status: DC
  Administered 2017-03-30: 40 mg via ORAL
  Filled 2017-03-30: qty 2

## 2017-03-30 MED ORDER — FAMOTIDINE 20 MG PO TABS: 20.0000 mg | ORAL_TABLET | Freq: Two times a day (BID) | ORAL | Status: DC

## 2017-03-30 NOTE — ED Triage Notes (Addendum)
Pt here last night, unresponsive; left ICU AMA because "he couldn't get any answers"; pt c/o "not feeling well, dizzy"; pt st he has no memory of events leading to arrival yesterday, only that he took "1/2 xanax" and went to sleep; chart notes indicate pt was violent & confused requiring security & [place] [employer] upon leaving earlier but pt denies this stating that he was just angry with the male nurse that took care of him; pt also denies any use of percocet, st last ds was 9/7

## 2017-03-30 NOTE — Progress Notes (Signed)
[facility] [place] at [facility]   PATIENT NAME: Wyatt Hernandez    MR#:  [id number]  DATE OF BIRTH:  [birth year]  SUBJECTIVE:  CHIEF COMPLAINT:   Chief Complaint  Patient presents with  . Loss of Consciousness   More awake. Oriented x 4 Tells me he took a xanax that his friend gave him to calm him down. Off narcan drip Wife thinks he is close to baseline Sitting ina chair  REVIEW OF SYSTEMS:    Review of Systems  Constitutional: Positive for malaise/fatigue. Negative for chills and fever.  HENT: Negative for sore throat.   Eyes: Negative for blurred vision, double vision and pain.  Respiratory: Negative for cough, hemoptysis, shortness of breath and wheezing.   Cardiovascular: Negative for chest pain, palpitations, orthopnea and leg swelling.  Gastrointestinal: Negative for abdominal pain, constipation, diarrhea, heartburn, nausea and vomiting.  Genitourinary: Negative for dysuria and hematuria.  Musculoskeletal: Positive for back pain. Negative for joint pain.  Skin: Negative for rash.  Neurological: Positive for weakness. Negative for sensory change, speech change, focal weakness and headaches.  Endo/Heme/Allergies: Does not bruise/bleed easily.  Psychiatric/Behavioral: Negative for depression. The patient is not nervous/anxious.     DRUG ALLERGIES:   Allergies  Allergen Reactions  . Shellfish Allergy Nausea And Vomiting    VITALS:  Blood pressure (!) 148/109, pulse 76, temperature 98.2 F (36.8 C), temperature source Oral, resp. rate (!) 22, height  (1.778 m), weight (!) 138.8 kg (306 lb), SpO2 96 %.  PHYSICAL EXAMINATION:   Physical Exam  GENERAL:  66 y.o.-year-old patient lying in the bed with no acute distress. Morbidly obese EYES: Pupils equal, round, reactive to light and accommodation. No scleral icterus. Extraocular muscles intact.  HEENT: Head atraumatic, normocephalic. Oropharynx and nasopharynx clear.  NECK:  Supple, no  jugular venous distention. No thyroid enlargement, no tenderness.  LUNGS: Normal breath sounds bilaterally, no wheezing, rales, rhonchi. No use of accessory muscles of respiration.  CARDIOVASCULAR: S1, S2 normal. No murmurs, rubs, or gallops.  ABDOMEN: Soft, nontender, nondistended. Bowel sounds present. No organomegaly or mass.  EXTREMITIES: No cyanosis, clubbing or edema b/l.    NEUROLOGIC: Cranial nerves II through XII are intact. No focal Motor or sensory deficits b/l.   PSYCHIATRIC: The patient is alert and oriented x 3.  SKIN: No obvious rash, lesion, or ulcer.   LABORATORY PANEL:   CBC  Recent Labs Lab 03/30/17 0359  WBC 6.5  HGB 14.4  HCT 43.7  PLT 303   ------------------------------------------------------------------------------------------------------------------ Chemistries   Recent Labs Lab 03/29/17 2103 03/30/17 0359  NA 139 140  K 3.9 3.6  CL 104 104  CO2 28 28  GLUCOSE 92 122*  BUN 24* 23*  CREATININE 1.68* 1.57*  CALCIUM 9.3 9.4  AST 17  --   ALT 13*  --   ALKPHOS 55  --   BILITOT 0.6  --    ------------------------------------------------------------------------------------------------------------------  Cardiac Enzymes  Recent Labs Lab 03/29/17 2103  TROPONINI <0.03   ------------------------------------------------------------------------------------------------------------------  RADIOLOGY:  Ct Head Wo Contrast  Result Date: 03/29/2017 CLINICAL DATA:  Found unresponsive on floor at home. EXAM: CT HEAD WITHOUT CONTRAST TECHNIQUE: Contiguous axial images were obtained from the base of the skull through the vertex without intravenous contrast. COMPARISON:  CT HEAD May 30, 2015 FINDINGS: BRAIN: No intraparenchymal hemorrhage, mass effect nor midline shift. The ventricles and sulci are normal for age. Patchy supratentorial white matter hypodensities within normal range for patient's age, though non-specific are most  compatible with chronic  small vessel ischemic disease. No acute large vascular territory infarcts. No abnormal extra-axial fluid collections. Basal cisterns are patent. VASCULAR: Mild calcific atherosclerosis of the carotid siphons. SKULL: No skull fracture. No significant scalp soft tissue swelling. SINUSES/ORBITS: RIGHT frontal sinus osteoma. Mild maxillary sinus mucosal thickening. Mastoid air cells are well aerated.The included ocular globes and orbital contents are non-suspicious. OTHER: None. IMPRESSION: Stable negative noncontrast CT HEAD for age. Electronically Signed   By: Awilda Metro M.D.   On: 03/29/2017 21:40   Dg Chest Port 1 View  Result Date: 03/29/2017 CLINICAL DATA:  Labored breathing, unresponsive EXAM: PORTABLE CHEST 1 VIEW COMPARISON:  08/15/2016 FINDINGS: Low lung volumes. No consolidation or effusion. Normal cardiomediastinal silhouette. No pneumothorax. IMPRESSION: Low lung volumes.  Negative for edema or infiltrate. Electronically Signed   By: Jasmine Pang M.D.   On: 03/29/2017 21:20     ASSESSMENT AND PLAN:   * Acute toxic encephalopathy due to likely Percocet overdose and xanax. Unintentional due to back pain. Responded well to Narcan. Off narcan drip. Mproved. Discussed with Ramachandran. Monitor overnigth and d/c in AM Counselled to take only medication as prescribed  * CKD stage III is stable  * Chronic diastolic congestive heart failure. Home medications will be continued.  * Sleep apnea. CPAP at night  * Chronic back pain. Hold narcotic medications at this time.  All the records are reviewed and case discussed with Care Management/Social Worker Management plans discussed with the patient, family and they are in agreement.  CODE STATUS: FULL CODE  DVT Prophylaxis: SCDs  TOTAL TIME TAKING CARE OF THIS PATIENT: 30 minutes.   POSSIBLE D/C IN 1-2 DAYS, DEPENDING ON CLINICAL CONDITION.  Milagros Loll R M.D on 03/30/2017 at 5:45 PM  Between 7am to 6pm - Pager -  [phone]  After 6pm go to [email or web address] - password [email or web address] [email or web address]  [facility]  Office  [phone]  CC: Primary care physician; Leanna Sato, MD  Note: This dictation was prepared with Dragon dictation along with smaller phrase technology. Any transcriptional errors that result from this process are unintentional.

## 2017-03-30 NOTE — Progress Notes (Signed)
At 1455 hours, I was notified by Central Telemetry that the patient in room 15, Wyatt Hernandez, did not have any telemetry transmitting. I immediately went to the patient's room and found him pulling off his leads, removing his pulse oximetry cable, blood pressure cuff, and gown. The patient stated that he was "getting the hell out of this fucking place and you'll have to call the [place] [employer] if you anyone thinks they are going to stop me."  As he was doing this, I attempted to redirect the patient and calm him. He had made threats of leaving several times previously this shift, however, I was able to help him understand how he got here, why he was here, and what his plan of care was. Each other time, the patient eventually complied. The wife had already informed me at the beginning of my shift that the patient was confused, wasn't making sense, and was different from his baseline. With his urinal within view and reach, the patient had already urinated in the trashcan and floor twice prior this incident. Seeing that the patient was continuing to be non-compliant, I notified Dr. Nicholos Johns who immediately came to the bedside. Security was also notified and responded when the patient began to make verbal threats that he would "snap somebody's neck if anyone tried to stop him." The patient continued to put his own clothing on. The patient repeatedly complained that no one has told him why he was here, yet this Clinical research associate, physicians, other nurses, and his wife have explained it to him multiple times. The patient changes his story of events leading up to this illness multiple times, including the medications he took. Dr. Nicholos Johns was eventually able to get the patient to agree to stay, however, stating that he would be leaving Friday no matter what, as he had a meeting at church to attend. The patient did an ambulation trial in the room w/o difficulty, however, a slight gait balance issue was seen. When the patient was  asked to have a seat on the bed so this writer could put his monitoring equipment back on, the patient walked out into the hallway and headed towards the Operating Room. At this time, the patient still had a saline lock in his left AC. Multiple staff members attempted to stop the patient so we could remove it, while reassuring him that we weren't going to try to ultimately stop him from leaving. The patient turned around and headed back towards the unit, and asked where the exit door was. He continued to repeat "don't get close to me.nobody better touch me." We (security, [place], [place], [place]) walked along with the patient as he headed towards the medical mall. He stopped at the railing by the chapel and leaned over the rail and stated that he did not feel well. When asked how he was going to get home, he stated that he was going to walk. We informed him that his wife was on the way, to which he replied, "I don't give a fuck what she's doing." He was offered a wheelchair ride back to his room, to which he declined. He also continued to refuse to let us remove the IV. He started walking towards the elevators by surgery waiting area. At this time, a Human resources officer joined [place]. The risks of leaving the IV in place were explained to the patient, including risk of infection, bleeding, perforation of vessels, pulmonary embolus secondary from blood clots forming in an IV that isn't properly cared  for, all of these which could lead to death. The patient stated, "I don't give a damn, I guess I'll just die then. I'll take it out when I get home." Multiple attempts at convincing the patient into allowing staff to remove the IV were unsuccessful thus far. The patient continued to verbally threaten and harass staff and by-standers, using crude foul language. To prevent risk of injury to the patient or staff, efforts were ceased and the patient got on the elevator, alone. The patient exited the building through the  revolving door at the medical mall. The patient went outside and sat on a bench by the door. Melissa, Transporter, went outside and was able to talk the patient in to having the IV removed. He came back inside the medical mall, accompanied by [facility]. This writer went down to meet the patient. While removing the tape, the patient stated, "I don't want you taking it out. Get somebody else." He then lunged his chest towards me, and swung his left arm at me. I jumped backwards. The patient continued to approach me, as I instructed him not to come any closer to me. [employer] officer was witness to same and had to physically hold the patient back from me, as I continued to back further away from them. As the IV was being removed by Chari Manning, RN, Dr. Nicholos Johns and I were standing at the first set of chairs by the registration desk. As soon as the IV was removed, the patient charged towards [place] with his fist in the air, having to be restrained once again by [employer] officer. At this time this Clinical research associate, along with Melissa retreated from the scene of events for our safety. Patient was left in the care of [place], RN & [employer] officer.

## 2017-03-30 NOTE — ED Provider Notes (Signed)
[facility] Emergency Department Provider Note  ____________________________________________   First MD Initiated Contact with Patient 03/30/17 2046     (approximate)  I have reviewed the triage vital signs and the nursing notes.   HISTORY  Chief Complaint Dizziness   HPI Wyatt Hernandez is a 66 y.o. male who comes to the emergency department with lightheadedness for the past several hours. I'm very familiar with the patient because I admitted him to the intensive care unit yesterday on a Narcan drip for an opioid overdose. Yesterday he required 4 mg of naloxone to awaken than I admitted him on a drip at 3 mg an hour. According to chart review the patient awoke sometime this afternoon, became upset, and left the hospital AGAINST MEDICAL ADVICE roughly 3 hours prior to his arrival in the emergency department today. The patient is amnestic to the events of yesterday. He says that he just generally feels run down. He denies vertigo. He denies chest pain or shortness of breath. His symptoms came on gradually and seemed to be improving. Nothing seems to make them better or worse.   Past Medical History:  Diagnosis Date  . 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 CHF (congestive heart failure) (HCC)   . Gout   . Hypertension   . Renal disorder   . Sleep apnea   . Vitamin D deficiency     Patient Active Problem List   Diagnosis Date Noted  . Opiate overdose (HCC)   . Narcotic overdose (HCC) 03/29/2017  . 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 Shortness of breath 09/01/2016  . (HFpEF) heart failure with preserved ejection fraction (HCC) 09/01/2016  . Hypertension 09/01/2016  . Mixed hyperlipidemia 09/01/2016  . Chest pain 08/15/2016    Past Surgical History:  Procedure Laterality Date  . COLONOSCOPY WITH PROPOFOL N/A 02/03/2017   Procedure: COLONOSCOPY  WITH PROPOFOL;  Surgeon: Christena Deem, MD;  Location: [facility] ENDOSCOPY;  Service: Endoscopy;  Laterality: N/A;  . ESOPHAGOGASTRODUODENOSCOPY (EGD) WITH PROPOFOL N/A 02/03/2017   Procedure: ESOPHAGOGASTRODUODENOSCOPY (EGD) WITH PROPOFOL;  Surgeon: Christena Deem, MD;  Location[facility] ENDOSCOPY;  Service: Endoscopy;  Laterality: N/A;  . HERNIA REPAIR    . TYMPANOPLASTY Left     Prior to Admission medications   Medication Sig Start Date End Date Taking? Authorizing Provider  amLODipine (NORVASC) 10 MG tablet Take 10 mg by mouth daily. 11/06/12   [provider]  cyanocobalamin 50 MCG tablet Take 1 tablet by mouth daily.    [provider]  fluticasone (FLONASE) 50 MCG/ACT nasal spray Place into both nostrils daily.    [provider]  hydrochlorothiazide (MICROZIDE) 12.5 MG capsule Take 12.5 mg by mouth daily. 11/06/12   [provider]  lisinopril (PRINIVIL,ZESTRIL) 40 MG tablet Take 40 mg by mouth daily.    [provider]  metoprolol (LOPRESSOR) 50 MG tablet Take 1 tablet (50 mg total) by mouth 2 (two) times daily. 08/31/16   End, Cristal Deer, MD  oxyCODONE-acetaminophen (PERCOCET/ROXICET) 5-325 MG tablet Take 1 tablet by mouth daily.    [provider]  ranitidine (ZANTAC) 150 MG tablet Take 150 mg by mouth 2 (two) times daily.    [provider]  tadalafil (CIALIS) 20 MG tablet Take 20 mg by mouth daily as needed for erectile dysfunction.    [provider]  tamsulosin (FLOMAX) 0.4 MG CAPS capsule Take  0.4 mg by mouth daily.    [provider]  Vitamin D, Cholecalciferol, 400 units TABS Take 1 tablet by mouth daily. 08/15/12   [provider]    Allergies Shellfish allergy  Family History  Problem Relation Age of Onset  . Heart failure Father   . Heart attack Father 24  . Hyperlipidemia Mother   . Hypertension Mother   . Heart failure Sister   . Heart attack Sister   . Heart attack Brother 65    . Heart attack Brother 42    Social History Social History  Substance Use Topics  . Smoking status: Never Smoker  . Smokeless tobacco: Never Used  . Alcohol use Yes     Comment: Very rare alcohol (~3 oz per month)    Review of Systems Constitutional: No fever/chills Eyes: No visual changes. ENT: No sore throat. Cardiovascular: Denies chest pain. Respiratory: Denies shortness of breath. Gastrointestinal: No abdominal pain.  No nausea, no vomiting.  No diarrhea.  No constipation. Genitourinary: Negative for dysuria. Musculoskeletal: Negative for back pain. Skin: Negative for rash. Neurological: Negative for headaches, focal weakness or numbness.   ____________________________________________   PHYSICAL EXAM:  VITAL SIGNS: ED Triage Vitals  Enc Vitals Group     BP 03/30/17 1928 (!) 150/85     Pulse Rate 03/30/17 1928 96     Resp 03/30/17 1928 18     Temp 03/30/17 1928 97.9 F (36.6 C)     Temp Source 03/30/17 1928 Oral     SpO2 03/30/17 1928 98 %     Weight 03/30/17 1929 (!) 304 lb 6.4 oz (138.1 kg)     Height 03/30/17 1929  (1.575 m)     Head Circumference --      Peak Flow --      Pain Score --      Pain Loc --      Pain Edu? --      Excl. in GC? --     Constitutional: alert and oriented 4 morbidly obese nontoxic no diaphoresis speaks in full clear sentences Eyes: PERRL EOMI. pupils midrange and brisk Head: Atraumatic. Nose: No congestion/rhinnorhea. Mouth/Throat: No trismus Neck: No stridor.   Cardiovascular: Normal rate, regular rhythm. Grossly normal heart sounds.  Good peripheral circulation. Respiratory: Normal respiratory effort.  No retractions. Lungs CTAB and moving good air Gastrointestinal: morbidly obese soft nontender Musculoskeletal: No lower extremity edema   Neurologic:  Normal speech and language. No gross focal neurologic deficits are appreciated. impression ambulate without difficulty Skin:  Skin is warm, dry and intact. No rash  noted. Psychiatric: Mood and affect are normal. Speech and behavior are normal.    ____________________________________________   DIFFERENTIAL includes but not limited to  acute coronary syndrome, arrhythmia, dehydration, fatigue ____________________________________________   LABS (all labs ordered are listed, but only abnormal results are displayed)  Labs Reviewed  CBC - Abnormal; Notable for the following:       Result Value   RDW 15.7 (*)    All other components within normal limits  BASIC METABOLIC PANEL - Abnormal; Notable for the following:    Glucose, Bld 127 (*)    BUN 24 (*)    Creatinine, Ser 1.66 (*)    GFR calc non Af Amer 42 (*)    GFR calc Af Amer 49 (*)    All other components within normal limits  TROPONIN I    blood work reviewed and interpreted by me is stable with no acute  disease noted __________________________________________  EKG  ED ECG REPORT I, Merrily Brittle, the attending physician, personally viewed and interpreted this ECG.  Date: 03/30/2017 EKG Time:  Rate: 94 Rhythm: normal sinus rhythm QRS Axis: normal Intervals: normal ST/T Wave abnormalities: normal Narrative Interpretation: no evidence of acute ischemia  ____________________________________________  RADIOLOGY   ____________________________________________   PROCEDURES  Procedure(s) performed: no  Procedures  Critical Care performed: no  Observation: no ____________________________________________   INITIAL IMPRESSION / ASSESSMENT AND PLAN / ED COURSE  Pertinent labs & imaging results that were available during my care of the patient were reviewed by me and considered in my medical decision making (see chart for details).  The patient arrives hemodynamically stable and very well-appearing he is able to ambulate without difficulty and has no gross neurological deficits. I had a lengthy discussion with the patient regarding his care yesterday and that as he is no  longer requiring naloxone he likely would've been discharged his afternoon anyway. It is normal to feel fatigued and rundown after a difficult day. He verbalizes understanding and agreement with the plan.      ____________________________________________   FINAL CLINICAL IMPRESSION(S) / ED DIAGNOSES  Final diagnoses:  Lightheadedness      NEW MEDICATIONS STARTED DURING THIS VISIT:  Discharge Medication List as of 03/30/2017  8:57 PM       Note:  This document was prepared using Dragon voice recognition software and may include unintentional dictation errors.     Merrily Brittle, MD 03/30/17 [phone]

## 2017-03-30 NOTE — Progress Notes (Signed)
Patient departed in car with family members.

## 2017-03-30 NOTE — Progress Notes (Signed)
Patient sitting on bench at [facility]. States he doesn't feel good. Encouraged patient to stay. Explained we could put him in a private room on a med-surg unit(patient had floor care orders). Patient stated he would stay if I could tell him he would be discharged tomorrow. Explained I could not guarantee that would happen. Patient stated he would go home. Requested he return to the ED if he felt his condition was worsening.

## 2017-03-30 NOTE — Progress Notes (Signed)
Agreed to have IV removed by Lorenda Peck, RN.

## 2017-03-30 NOTE — Discharge Instructions (Signed)
Fortunately today your blood work is reassuring. Please do not ever taken Xanax again and make sure you follow up with your primary care physician this coming Monday for reevaluation. Return to the emergency department sooner for any concerns.  It was a pleasure to take care of you today, and thank you for coming to our emergency department.  If you have any questions or concerns before leaving please ask the nurse to grab me and I'm more than happy to go through your aftercare instructions again.  If you were prescribed any opioid pain medication today such as Norco, Vicodin, Percocet, morphine, hydrocodone, or oxycodone please make sure you do not drive when you are taking this medication as it can alter your ability to drive safely.  If you have any concerns once you are home that you are not improving or are in fact getting worse before you can make it to your follow-up appointment, please do not hesitate to call [phone] and come back for further evaluation.  Merrily Brittle, MD  Results for orders placed or performed during the hospital encounter of 03/30/17  CBC  Result Value Ref Range   WBC 8.5 3.8 - 10.6 K/uL   RBC 5.50 4.40 - 5.90 MIL/uL   Hemoglobin 14.7 13.0 - 18.0 g/dL   HCT 16.1 09.6 - 04.5 %   MCV 81.2 80.0 - 100.0 fL   MCH 26.8 26.0 - 34.0 pg   MCHC 33.0 32.0 - 36.0 g/dL   RDW 40.9 (H) 81.1 - 91.4 %   Platelets 358 150 - 440 K/uL  Basic metabolic panel  Result Value Ref Range   Sodium 139 135 - 145 mmol/L   Potassium 3.7 3.5 - 5.1 mmol/L   Chloride 103 101 - 111 mmol/L   CO2 25 22 - 32 mmol/L   Glucose, Bld 127 (H) 65 - 99 mg/dL   BUN 24 (H) 6 - 20 mg/dL   Creatinine, Ser 7.82 (H) 0.61 - 1.24 mg/dL   Calcium 9.9 8.9 - 95.6 mg/dL   GFR calc non Af Amer 42 (L) >60 mL/min   GFR calc Af Amer 49 (L) >60 mL/min   Anion gap 11 5 - 15  Troponin I  Result Value Ref Range   Troponin I <0.03 <0.03 ng/mL   Ct Head Wo Contrast  Result Date: 03/29/2017 CLINICAL DATA:  Found  unresponsive on floor at home. EXAM: CT HEAD WITHOUT CONTRAST TECHNIQUE: Contiguous axial images were obtained from the base of the skull through the vertex without intravenous contrast. COMPARISON:  CT HEAD May 30, 2015 FINDINGS: BRAIN: No intraparenchymal hemorrhage, mass effect nor midline shift. The ventricles and sulci are normal for age. Patchy supratentorial white matter hypodensities within normal range for patient's age, though non-specific are most compatible with chronic small vessel ischemic disease. No acute large vascular territory infarcts. No abnormal extra-axial fluid collections. Basal cisterns are patent. VASCULAR: Mild calcific atherosclerosis of the carotid siphons. SKULL: No skull fracture. No significant scalp soft tissue swelling. SINUSES/ORBITS: RIGHT frontal sinus osteoma. Mild maxillary sinus mucosal thickening. Mastoid air cells are well aerated.The included ocular globes and orbital contents are non-suspicious. OTHER: None. IMPRESSION: Stable negative noncontrast CT HEAD for age. Electronically Signed   By: Awilda Metro M.D.   On: 03/29/2017 21:40   US Abdomen Complete  Result Date: 03/02/2017 CLINICAL DATA:  Generalized abdominal pain. EXAM: ABDOMEN ULTRASOUND COMPLETE COMPARISON:  CT of the abdomen and pelvis 10/18/2016 FINDINGS: Gallbladder: Incompletely distended. No gallstones or wall thickening visualized.  No sonographic Murphy sign noted by sonographer. Common bile duct: Diameter: 3.6 mm Liver: No focal lesion identified. Increased parenchymal echogenicity. Portal vein is patent on color Doppler imaging with normal direction of blood flow towards the liver. IVC: No abnormality visualized. Pancreas: Visualized portion unremarkable. Spleen: Size and appearance within normal limits. Right Kidney: Length: 8.5 cm. Echogenicity within normal limits. No mass or hydronephrosis visualized. Left Kidney: Length: 9.3 cm. Echogenicity within normal limits. No mass or  hydronephrosis visualized. Abdominal aorta: No aneurysm visualized. Other findings: None. IMPRESSION: No acute abnormalities within the abdomen. Electronically Signed   By: Ted Mcalpine M.D.   On: 03/02/2017 10:08   Nm Hepato W/eject Fract  Result Date: 03/02/2017 CLINICAL DATA:  Postprandial abdominal pain for 1 year. EXAM: NUCLEAR MEDICINE HEPATOBILIARY IMAGING WITH GALLBLADDER EF TECHNIQUE: Sequential images of the abdomen were obtained out to 60 minutes following intravenous administration of radiopharmaceutical. After oral ingestion of Ensure, gallbladder ejection fraction was determined. At 60 min, normal ejection fraction is greater than 33%. RADIOPHARMACEUTICALS:  5.0 mCi Tc-51m  Choletec IV COMPARISON:  Abdominal ultrasound dated 03/02/2017 FINDINGS: Satisfactory hepatocellular uptake of radiopharmaceutical from the blood pool noted. Biliary and gallbladder activity by 8 minutes. Bowel activity not initially seen but is visible after the oral Ensure meal. The patient noted slight queasiness after drinking the Ensure meal. Calculated gallbladder ejection fraction is 79%. (Normal gallbladder ejection fraction with Ensure is greater than 33%.) IMPRESSION: 1. Normal hepatobiliary scan with gallbladder ejection fraction of 79%. Electronically Signed   By: Gaylyn Rong M.D.   On: 03/02/2017 14:44   Dg Chest Port 1 View  Result Date: 03/29/2017 CLINICAL DATA:  Labored breathing, unresponsive EXAM: PORTABLE CHEST 1 VIEW COMPARISON:  08/15/2016 FINDINGS: Low lung volumes. No consolidation or effusion. Normal cardiomediastinal silhouette. No pneumothorax. IMPRESSION: Low lung volumes.  Negative for edema or infiltrate. Electronically Signed   By: Jasmine Pang M.D.   On: 03/29/2017 21:20

## 2017-03-30 NOTE — Discharge Summary (Signed)
Discharge AGAINST MEDICAL ADVICE.   The patient became fairly agitated in the afternoon. He refused taking his medications and he maintained that he wanted to go home. He threatened violence when he was told that he was not being discharged. Security was called, the patient eventually calmed down, I spoke with him and he initially agreed to stay in the hospital  Upon my evaluation, the patient was alert and oriented 4, he was ambulated within his room, he was able to ambulate with a moderate gait and appeared mildly unsteady. It was my recommendation to him that he stay in the hospital for further treatment, he declined this and maintained that he wanted to go home and he started to walk out. We asked that he stay and signed the AGAINST MEDICAL ADVICE form, but he declined this, and started walking out the door towards the exit. We asked that he stay so that his IV could be removed but he became angry upon confrontation, threatened violence, and continued to walk out of the door. Security was called as well as [employer], [employer] who spoke with the patient, however, it was clear that we would not be able to remove his IV without resorting to violence and potentially hurting him. Therefore, we decided to allow him to leave, he stayed outside for some time, then returned and allowed the nurse to remove his IV. During this time At that time he made  threatening motions/swinging towards staff, again security and [employer] [place] [employer] police were present and had to calm him down. His wife was called to pick him up, as he maintained that he was going to go home no matter what, though we advised him against this.   Wells Guiles, MD.   Board Certified in Internal Medicine, Pulmonary Medicine, Critical Care Medicine, and Sleep Medicine.  [facility] Office Number: [phone] Pager: [phone]  Santiago Glad, M.D.  Billy Fischer, M.D

## 2017-03-31 DIAGNOSIS — R69 Illness, unspecified: Secondary | ICD-10-CM | POA: Diagnosis not present

## 2017-03-31 LAB — HIV ANTIBODY (ROUTINE TESTING W REFLEX): HIV Screen 4th Generation wRfx: NONREACTIVE

## 2017-04-06 DIAGNOSIS — R7309 Other abnormal glucose: Secondary | ICD-10-CM | POA: Diagnosis not present

## 2017-04-06 DIAGNOSIS — N183 Chronic kidney disease, stage 3 (moderate): Secondary | ICD-10-CM | POA: Diagnosis not present

## 2017-04-06 DIAGNOSIS — I10 Essential (primary) hypertension: Secondary | ICD-10-CM | POA: Diagnosis not present

## 2017-05-08 DIAGNOSIS — M109 Gout, unspecified: Secondary | ICD-10-CM | POA: Diagnosis not present

## 2017-05-08 DIAGNOSIS — G894 Chronic pain syndrome: Secondary | ICD-10-CM | POA: Diagnosis not present

## 2017-05-08 DIAGNOSIS — Z79891 Long term (current) use of opiate analgesic: Secondary | ICD-10-CM | POA: Diagnosis not present

## 2017-05-08 DIAGNOSIS — M545 Low back pain: Secondary | ICD-10-CM | POA: Diagnosis not present

## 2017-05-29 DIAGNOSIS — Z1389 Encounter for screening for other disorder: Secondary | ICD-10-CM | POA: Diagnosis not present

## 2017-05-29 DIAGNOSIS — M109 Gout, unspecified: Secondary | ICD-10-CM | POA: Diagnosis not present

## 2017-05-29 DIAGNOSIS — L02412 Cutaneous abscess of left axilla: Secondary | ICD-10-CM | POA: Diagnosis not present

## 2017-06-28 DIAGNOSIS — M109 Gout, unspecified: Secondary | ICD-10-CM | POA: Diagnosis not present

## 2017-07-25 ENCOUNTER — Encounter: Admit: 2017-07-25 | Discharge: 2017-07-26 | Payer: MEDICARE

## 2017-07-25 DIAGNOSIS — I10 Essential (primary) hypertension: Secondary | ICD-10-CM | POA: Diagnosis not present

## 2017-07-25 MED ORDER — TAMSULOSIN 0.4 MG CAPSULE
ORAL_CAPSULE | Freq: Every day | ORAL | 3 refills | 0 days | Status: CP
Start: 2017-07-25 — End: ?

## 2017-07-26 DIAGNOSIS — G894 Chronic pain syndrome: Secondary | ICD-10-CM | POA: Diagnosis not present

## 2017-07-26 DIAGNOSIS — M109 Gout, unspecified: Secondary | ICD-10-CM | POA: Diagnosis not present

## 2017-07-26 DIAGNOSIS — M545 Low back pain: Secondary | ICD-10-CM | POA: Diagnosis not present

## 2017-07-26 DIAGNOSIS — Z79891 Long term (current) use of opiate analgesic: Secondary | ICD-10-CM | POA: Diagnosis not present

## 2017-07-27 DIAGNOSIS — E669 Obesity, unspecified: Secondary | ICD-10-CM | POA: Diagnosis not present

## 2017-07-27 DIAGNOSIS — I10 Essential (primary) hypertension: Secondary | ICD-10-CM | POA: Diagnosis not present

## 2017-07-27 DIAGNOSIS — M109 Gout, unspecified: Secondary | ICD-10-CM | POA: Diagnosis not present

## 2017-09-14 DIAGNOSIS — Z0000 Encounter for general adult medical examination without abnormal findings: Secondary | ICD-10-CM | POA: Diagnosis not present

## 2017-09-22 DIAGNOSIS — R69 Illness, unspecified: Secondary | ICD-10-CM | POA: Diagnosis not present

## 2017-09-22 DIAGNOSIS — E669 Obesity, unspecified: Secondary | ICD-10-CM | POA: Diagnosis not present

## 2017-09-26 DIAGNOSIS — G894 Chronic pain syndrome: Secondary | ICD-10-CM | POA: Diagnosis not present

## 2017-09-26 DIAGNOSIS — M109 Gout, unspecified: Secondary | ICD-10-CM | POA: Diagnosis not present

## 2017-09-26 DIAGNOSIS — Z79891 Long term (current) use of opiate analgesic: Secondary | ICD-10-CM | POA: Diagnosis not present

## 2017-09-26 DIAGNOSIS — M545 Low back pain: Secondary | ICD-10-CM | POA: Diagnosis not present

## 2017-10-10 DIAGNOSIS — E669 Obesity, unspecified: Secondary | ICD-10-CM | POA: Diagnosis not present

## 2017-10-10 DIAGNOSIS — I10 Essential (primary) hypertension: Secondary | ICD-10-CM | POA: Diagnosis not present

## 2017-10-16 DIAGNOSIS — I10 Essential (primary) hypertension: Secondary | ICD-10-CM | POA: Diagnosis not present

## 2017-10-16 DIAGNOSIS — E11630 Type 2 diabetes mellitus with periodontal disease: Secondary | ICD-10-CM | POA: Diagnosis not present

## 2017-10-16 DIAGNOSIS — H547 Unspecified visual loss: Secondary | ICD-10-CM | POA: Diagnosis not present

## 2017-10-16 DIAGNOSIS — Z7982 Long term (current) use of aspirin: Secondary | ICD-10-CM | POA: Diagnosis not present

## 2017-10-16 DIAGNOSIS — G4730 Sleep apnea, unspecified: Secondary | ICD-10-CM | POA: Diagnosis not present

## 2017-10-16 DIAGNOSIS — N400 Enlarged prostate without lower urinary tract symptoms: Secondary | ICD-10-CM | POA: Diagnosis not present

## 2017-10-16 DIAGNOSIS — R269 Unspecified abnormalities of gait and mobility: Secondary | ICD-10-CM | POA: Diagnosis not present

## 2017-10-16 DIAGNOSIS — R69 Illness, unspecified: Secondary | ICD-10-CM | POA: Diagnosis not present

## 2017-10-16 DIAGNOSIS — G8929 Other chronic pain: Secondary | ICD-10-CM | POA: Diagnosis not present

## 2017-10-22 DIAGNOSIS — M109 Gout, unspecified: Secondary | ICD-10-CM | POA: Diagnosis not present

## 2017-11-07 DIAGNOSIS — E669 Obesity, unspecified: Secondary | ICD-10-CM | POA: Diagnosis not present

## 2017-11-08 DIAGNOSIS — W57XXXA Bitten or stung by nonvenomous insect and other nonvenomous arthropods, initial encounter: Secondary | ICD-10-CM | POA: Diagnosis not present

## 2017-11-08 DIAGNOSIS — L989 Disorder of the skin and subcutaneous tissue, unspecified: Secondary | ICD-10-CM | POA: Diagnosis not present

## 2017-12-12 DIAGNOSIS — E669 Obesity, unspecified: Secondary | ICD-10-CM | POA: Diagnosis not present

## 2017-12-12 DIAGNOSIS — N183 Chronic kidney disease, stage 3 (moderate): Secondary | ICD-10-CM | POA: Diagnosis not present

## 2017-12-19 ENCOUNTER — Encounter: Admit: 2017-12-19 | Discharge: 2017-12-19 | Payer: MEDICARE

## 2017-12-19 DIAGNOSIS — M545 Low back pain: Secondary | ICD-10-CM | POA: Diagnosis not present

## 2017-12-19 DIAGNOSIS — M109 Gout, unspecified: Secondary | ICD-10-CM | POA: Diagnosis not present

## 2017-12-19 DIAGNOSIS — Z5181 Encounter for therapeutic drug level monitoring: Secondary | ICD-10-CM | POA: Diagnosis not present

## 2017-12-19 DIAGNOSIS — N183 Chronic kidney disease, stage 3 (moderate): Secondary | ICD-10-CM | POA: Diagnosis not present

## 2017-12-19 DIAGNOSIS — G894 Chronic pain syndrome: Secondary | ICD-10-CM | POA: Diagnosis not present

## 2017-12-19 DIAGNOSIS — Z79891 Long term (current) use of opiate analgesic: Secondary | ICD-10-CM | POA: Diagnosis not present

## 2017-12-20 DIAGNOSIS — G4733 Obstructive sleep apnea (adult) (pediatric): Secondary | ICD-10-CM | POA: Diagnosis not present

## 2017-12-24 DIAGNOSIS — G4733 Obstructive sleep apnea (adult) (pediatric): Secondary | ICD-10-CM | POA: Diagnosis not present

## 2017-12-26 ENCOUNTER — Ambulatory Visit: Admit: 2017-12-26 | Discharge: 2017-12-27 | Payer: MEDICARE

## 2017-12-26 DIAGNOSIS — N183 Chronic kidney disease, stage 3 (moderate): Secondary | ICD-10-CM | POA: Diagnosis not present

## 2017-12-26 DIAGNOSIS — R3129 Other microscopic hematuria: Secondary | ICD-10-CM | POA: Diagnosis not present

## 2017-12-26 DIAGNOSIS — I129 Hypertensive chronic kidney disease with stage 1 through stage 4 chronic kidney disease, or unspecified chronic kidney disease: Secondary | ICD-10-CM | POA: Diagnosis not present

## 2017-12-26 DIAGNOSIS — I10 Essential (primary) hypertension: Secondary | ICD-10-CM

## 2018-01-17 DIAGNOSIS — N183 Chronic kidney disease, stage 3 (moderate): Secondary | ICD-10-CM | POA: Diagnosis not present

## 2018-01-17 DIAGNOSIS — E669 Obesity, unspecified: Secondary | ICD-10-CM | POA: Diagnosis not present

## 2018-02-14 DIAGNOSIS — I10 Essential (primary) hypertension: Secondary | ICD-10-CM | POA: Diagnosis not present

## 2018-02-14 DIAGNOSIS — G4733 Obstructive sleep apnea (adult) (pediatric): Secondary | ICD-10-CM | POA: Diagnosis not present

## 2018-02-14 DIAGNOSIS — Z6841 Body Mass Index (BMI) 40.0 and over, adult: Secondary | ICD-10-CM | POA: Diagnosis not present

## 2018-02-22 DIAGNOSIS — E669 Obesity, unspecified: Secondary | ICD-10-CM | POA: Diagnosis not present

## 2018-02-22 DIAGNOSIS — R69 Illness, unspecified: Secondary | ICD-10-CM | POA: Diagnosis not present

## 2018-02-22 DIAGNOSIS — N183 Chronic kidney disease, stage 3 (moderate): Secondary | ICD-10-CM | POA: Diagnosis not present

## 2018-03-09 DIAGNOSIS — R69 Illness, unspecified: Secondary | ICD-10-CM | POA: Diagnosis not present

## 2018-03-10 DIAGNOSIS — R69 Illness, unspecified: Secondary | ICD-10-CM | POA: Diagnosis not present

## 2018-03-12 DIAGNOSIS — Z01818 Encounter for other preprocedural examination: Secondary | ICD-10-CM | POA: Diagnosis not present

## 2018-03-30 DIAGNOSIS — G894 Chronic pain syndrome: Secondary | ICD-10-CM | POA: Diagnosis not present

## 2018-03-30 DIAGNOSIS — Z79891 Long term (current) use of opiate analgesic: Secondary | ICD-10-CM | POA: Diagnosis not present

## 2018-03-30 DIAGNOSIS — M545 Low back pain: Secondary | ICD-10-CM | POA: Diagnosis not present

## 2018-03-30 DIAGNOSIS — M109 Gout, unspecified: Secondary | ICD-10-CM | POA: Diagnosis not present

## 2018-04-03 DIAGNOSIS — E669 Obesity, unspecified: Secondary | ICD-10-CM | POA: Diagnosis not present

## 2018-04-03 DIAGNOSIS — R7309 Other abnormal glucose: Secondary | ICD-10-CM | POA: Diagnosis not present

## 2018-05-15 DIAGNOSIS — E669 Obesity, unspecified: Secondary | ICD-10-CM | POA: Diagnosis not present

## 2018-05-15 DIAGNOSIS — I10 Essential (primary) hypertension: Secondary | ICD-10-CM | POA: Diagnosis not present

## 2018-05-21 DIAGNOSIS — N183 Chronic kidney disease, stage 3 (moderate): Secondary | ICD-10-CM | POA: Diagnosis not present

## 2018-05-21 DIAGNOSIS — H6980 Other specified disorders of Eustachian tube, unspecified ear: Secondary | ICD-10-CM | POA: Diagnosis not present

## 2018-05-21 DIAGNOSIS — M25512 Pain in left shoulder: Secondary | ICD-10-CM | POA: Diagnosis not present

## 2018-05-21 DIAGNOSIS — E669 Obesity, unspecified: Secondary | ICD-10-CM | POA: Diagnosis not present

## 2018-05-21 DIAGNOSIS — M7041 Prepatellar bursitis, right knee: Secondary | ICD-10-CM | POA: Diagnosis not present

## 2018-06-04 ENCOUNTER — Encounter: Admit: 2018-06-04 | Discharge: 2018-06-05 | Payer: MEDICARE

## 2018-06-04 DIAGNOSIS — N183 Chronic kidney disease, stage 3 (moderate): Secondary | ICD-10-CM | POA: Diagnosis not present

## 2018-06-05 ENCOUNTER — Encounter: Admit: 2018-06-05 | Discharge: 2018-06-06 | Payer: MEDICARE

## 2018-06-05 DIAGNOSIS — I129 Hypertensive chronic kidney disease with stage 1 through stage 4 chronic kidney disease, or unspecified chronic kidney disease: Secondary | ICD-10-CM | POA: Diagnosis not present

## 2018-06-05 DIAGNOSIS — N183 Chronic kidney disease, stage 3 (moderate): Secondary | ICD-10-CM | POA: Diagnosis not present

## 2018-06-05 DIAGNOSIS — I10 Essential (primary) hypertension: Secondary | ICD-10-CM

## 2018-06-11 DIAGNOSIS — I10 Essential (primary) hypertension: Secondary | ICD-10-CM | POA: Diagnosis not present

## 2018-06-11 DIAGNOSIS — E669 Obesity, unspecified: Secondary | ICD-10-CM | POA: Diagnosis not present

## 2018-06-11 DIAGNOSIS — R209 Unspecified disturbances of skin sensation: Secondary | ICD-10-CM | POA: Diagnosis not present

## 2018-06-11 DIAGNOSIS — M7041 Prepatellar bursitis, right knee: Secondary | ICD-10-CM | POA: Diagnosis not present

## 2018-06-18 DIAGNOSIS — J45998 Other asthma: Secondary | ICD-10-CM | POA: Diagnosis not present

## 2018-06-18 DIAGNOSIS — G894 Chronic pain syndrome: Secondary | ICD-10-CM | POA: Diagnosis not present

## 2018-06-18 DIAGNOSIS — M109 Gout, unspecified: Secondary | ICD-10-CM | POA: Diagnosis not present

## 2018-06-18 DIAGNOSIS — M25512 Pain in left shoulder: Secondary | ICD-10-CM | POA: Diagnosis not present

## 2018-06-18 DIAGNOSIS — M7041 Prepatellar bursitis, right knee: Secondary | ICD-10-CM | POA: Diagnosis not present

## 2018-06-18 DIAGNOSIS — Z79891 Long term (current) use of opiate analgesic: Secondary | ICD-10-CM | POA: Diagnosis not present

## 2018-06-18 DIAGNOSIS — R209 Unspecified disturbances of skin sensation: Secondary | ICD-10-CM | POA: Diagnosis not present

## 2018-06-18 DIAGNOSIS — M545 Low back pain: Secondary | ICD-10-CM | POA: Diagnosis not present

## 2018-06-18 DIAGNOSIS — I10 Essential (primary) hypertension: Secondary | ICD-10-CM | POA: Diagnosis not present

## 2018-06-23 IMAGING — CR DG CHEST 2V
2 series · 2 of 2 positions shown · non-contrast
Comparison: 05/30/2015

CLINICAL DATA: Shortness of breath for 2 months. Lower extremity
swelling.

EXAM:
CHEST  2 VIEW

[chest pa]
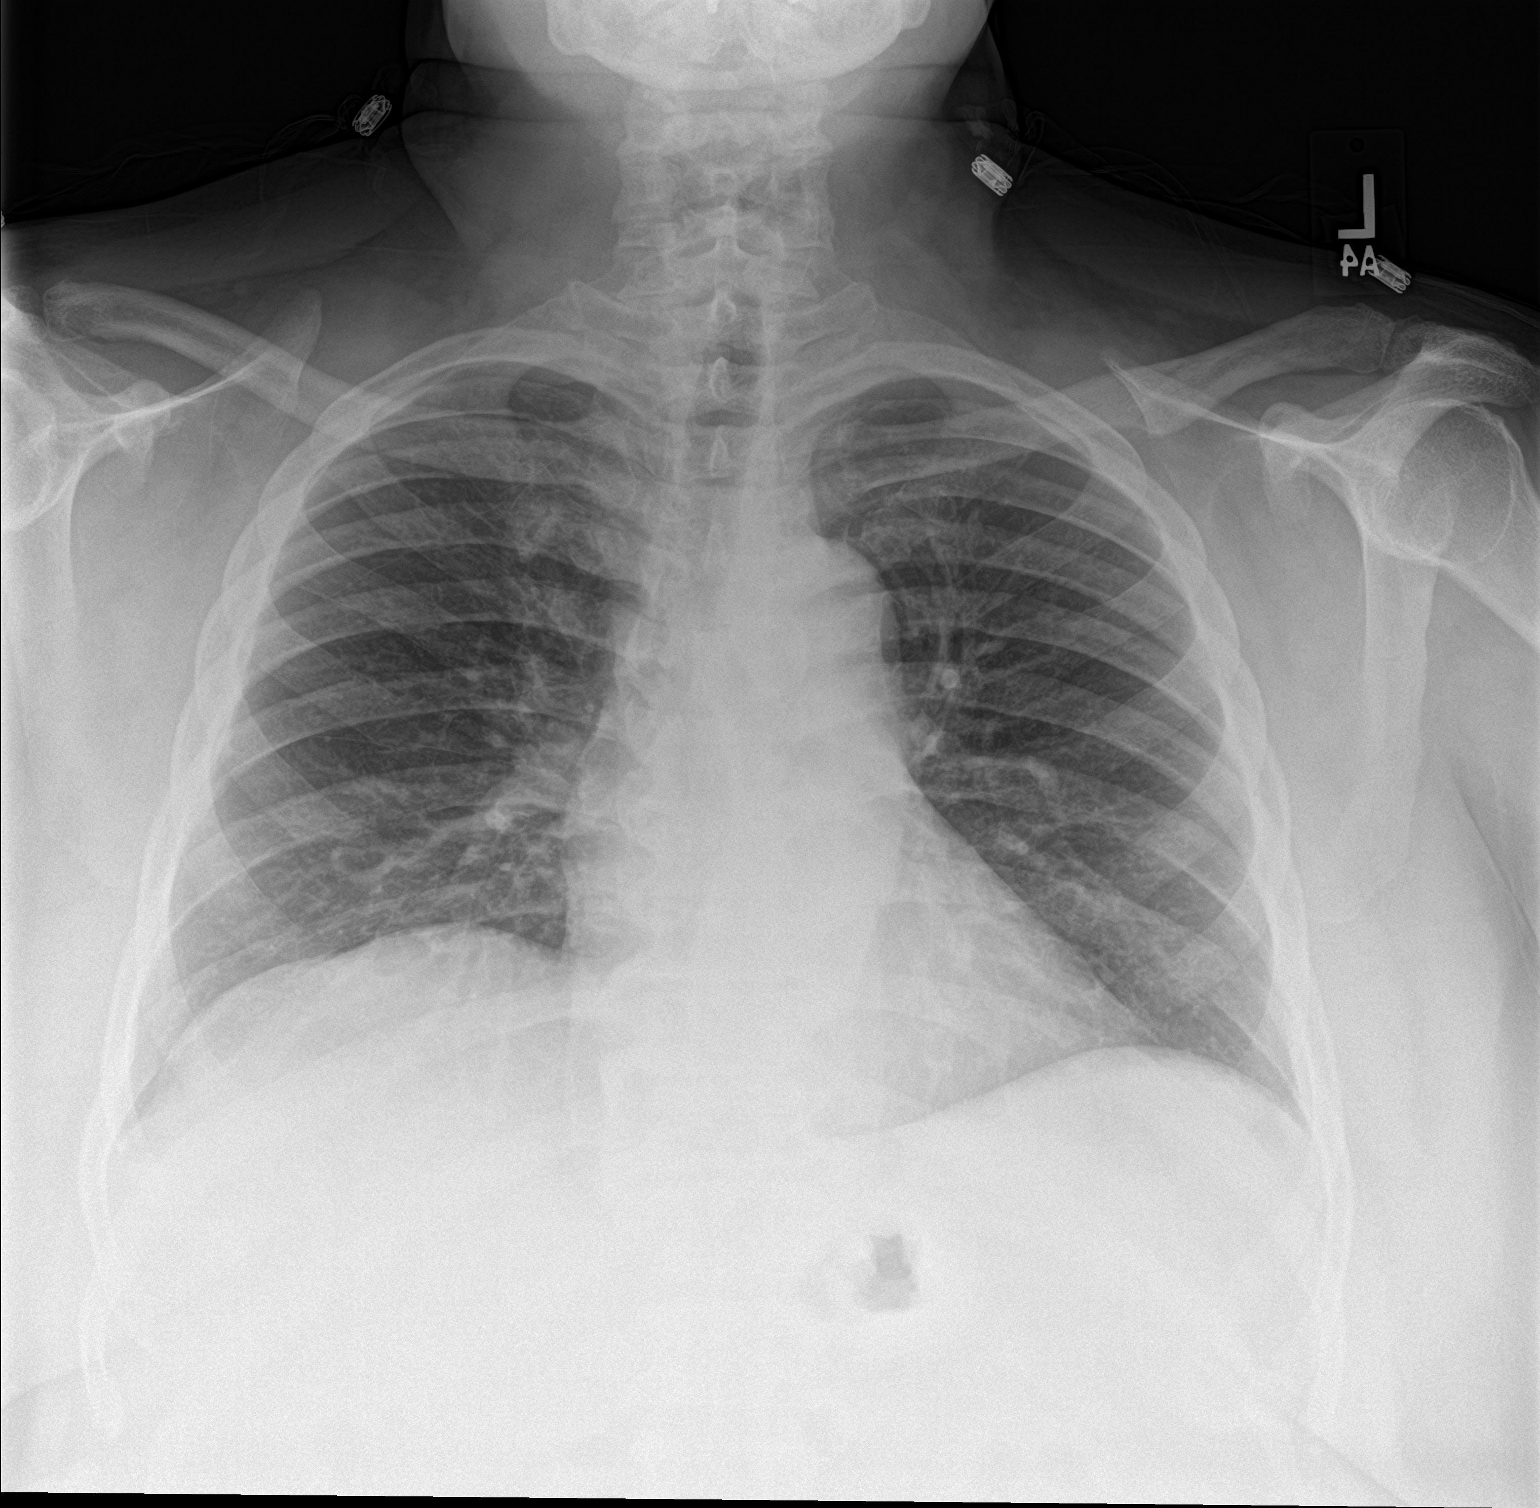

[chest lat]
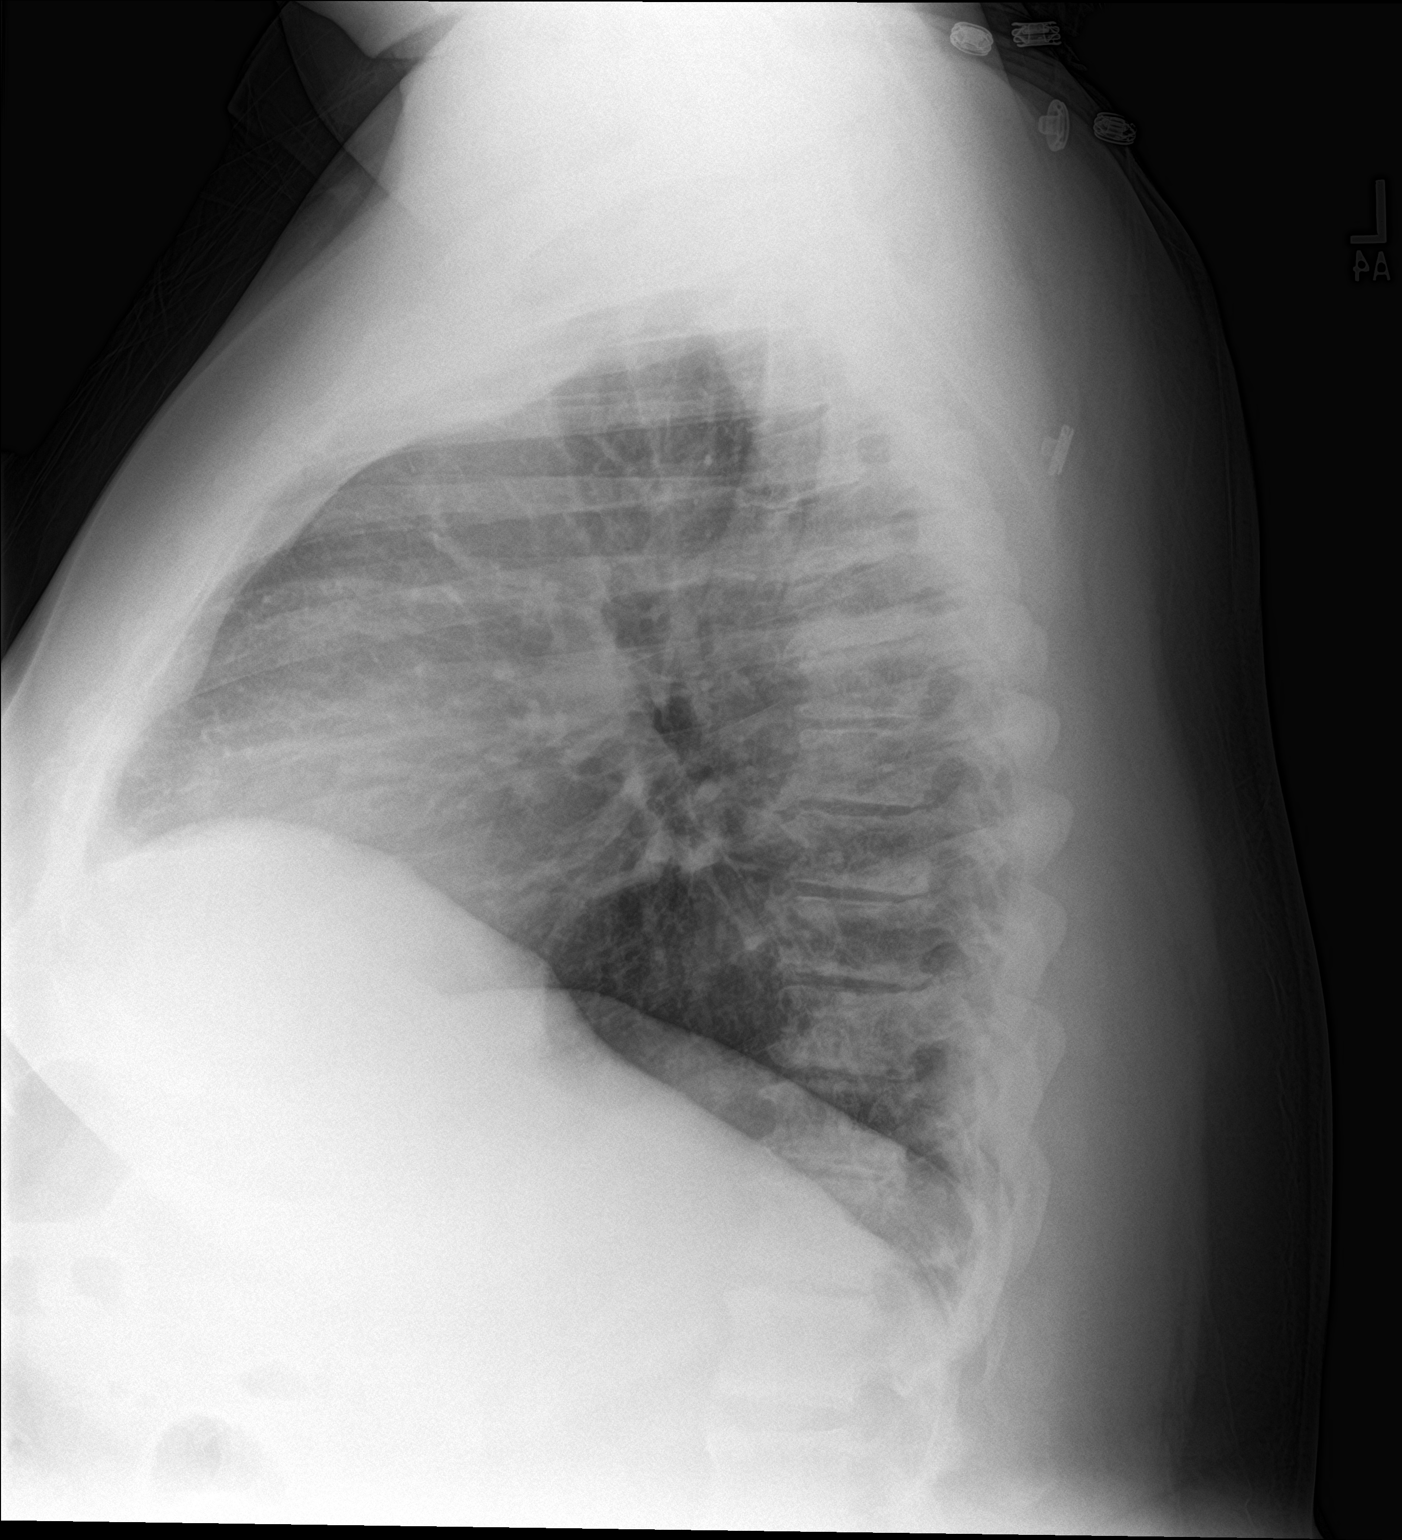

[2 of 2 positions shown; findings below may reference images not displayed]

FINDINGS: The heart size and mediastinal contours are within normal limits.
Both lungs are clear. No evidence of pneumothorax or pleural
effusion. Mild thoracic spine degenerative changes again noted .
IMPRESSION: No active cardiopulmonary disease.

## 2018-07-05 DIAGNOSIS — M7041 Prepatellar bursitis, right knee: Secondary | ICD-10-CM | POA: Diagnosis not present

## 2018-07-05 DIAGNOSIS — L03115 Cellulitis of right lower limb: Secondary | ICD-10-CM | POA: Diagnosis not present

## 2018-07-06 DIAGNOSIS — I10 Essential (primary) hypertension: Secondary | ICD-10-CM | POA: Diagnosis not present

## 2018-07-06 DIAGNOSIS — E669 Obesity, unspecified: Secondary | ICD-10-CM | POA: Diagnosis not present

## 2018-07-20 DIAGNOSIS — M25561 Pain in right knee: Secondary | ICD-10-CM | POA: Diagnosis not present

## 2018-07-20 DIAGNOSIS — M7051 Other bursitis of knee, right knee: Secondary | ICD-10-CM | POA: Diagnosis not present

## 2018-07-20 DIAGNOSIS — G8929 Other chronic pain: Secondary | ICD-10-CM | POA: Diagnosis not present

## 2018-07-27 ENCOUNTER — Encounter: Admit: 2018-07-27 | Discharge: 2018-07-27 | Disposition: A | Discharge status: Home / Self Care | Payer: MEDICARE

## 2018-07-27 DIAGNOSIS — S81801A Unspecified open wound, right lower leg, initial encounter: Secondary | ICD-10-CM | POA: Diagnosis not present

## 2018-07-27 DIAGNOSIS — N183 Chronic kidney disease, stage 3 (moderate): Secondary | ICD-10-CM | POA: Diagnosis not present

## 2018-07-27 DIAGNOSIS — G4733 Obstructive sleep apnea (adult) (pediatric): Secondary | ICD-10-CM | POA: Diagnosis not present

## 2018-07-27 DIAGNOSIS — L539 Erythematous condition, unspecified: Secondary | ICD-10-CM | POA: Diagnosis not present

## 2018-07-27 DIAGNOSIS — M1711 Unilateral primary osteoarthritis, right knee: Secondary | ICD-10-CM | POA: Diagnosis not present

## 2018-07-27 DIAGNOSIS — I129 Hypertensive chronic kidney disease with stage 1 through stage 4 chronic kidney disease, or unspecified chronic kidney disease: Secondary | ICD-10-CM | POA: Diagnosis not present

## 2018-07-27 DIAGNOSIS — L03115 Cellulitis of right lower limb: Secondary | ICD-10-CM | POA: Diagnosis not present

## 2018-07-27 DIAGNOSIS — M47817 Spondylosis without myelopathy or radiculopathy, lumbosacral region: Secondary | ICD-10-CM | POA: Diagnosis not present

## 2018-07-27 DIAGNOSIS — R6883 Chills (without fever): Secondary | ICD-10-CM | POA: Diagnosis not present

## 2018-07-27 DIAGNOSIS — M7989 Other specified soft tissue disorders: Secondary | ICD-10-CM | POA: Diagnosis not present

## 2018-07-27 DIAGNOSIS — Z823 Family history of stroke: Secondary | ICD-10-CM | POA: Diagnosis not present

## 2018-07-27 MED ORDER — OXYCODONE-ACETAMINOPHEN 5 MG-325 MG TABLET
ORAL_TABLET | ORAL | 0 refills | 0.00000 days | Status: CP | PRN
Start: 2018-07-27 — End: 2018-08-01

## 2018-07-27 MED ORDER — LEVOFLOXACIN 750 MG TABLET
ORAL_TABLET | Freq: Every day | ORAL | 0 refills | 0 days | Status: CP
Start: 2018-07-27 — End: 2018-08-06

## 2018-07-27 MED ORDER — DOXYCYCLINE HYCLATE 100 MG CAPSULE
ORAL_CAPSULE | Freq: Two times a day (BID) | ORAL | 0 refills | 0.00000 days | Status: CP
Start: 2018-07-27 — End: 2018-08-06

## 2018-08-05 ENCOUNTER — Ambulatory Visit: Admit: 2018-08-05 | Discharge: 2018-08-05 | Disposition: A | Discharge status: Home / Self Care | Payer: MEDICARE

## 2018-08-05 ENCOUNTER — Encounter: Admit: 2018-08-05 | Discharge: 2018-08-05 | Disposition: A | Discharge status: Home / Self Care | Payer: MEDICARE

## 2018-08-05 DIAGNOSIS — Z9989 Dependence on other enabling machines and devices: Secondary | ICD-10-CM | POA: Diagnosis not present

## 2018-08-05 DIAGNOSIS — S81801S Unspecified open wound, right lower leg, sequela: Secondary | ICD-10-CM | POA: Diagnosis not present

## 2018-08-05 DIAGNOSIS — N189 Chronic kidney disease, unspecified: Secondary | ICD-10-CM | POA: Diagnosis not present

## 2018-08-05 DIAGNOSIS — Z91013 Allergy to seafood: Secondary | ICD-10-CM | POA: Diagnosis not present

## 2018-08-05 DIAGNOSIS — L03115 Cellulitis of right lower limb: Secondary | ICD-10-CM | POA: Diagnosis not present

## 2018-08-05 DIAGNOSIS — Z823 Family history of stroke: Secondary | ICD-10-CM | POA: Diagnosis not present

## 2018-08-05 DIAGNOSIS — M47817 Spondylosis without myelopathy or radiculopathy, lumbosacral region: Secondary | ICD-10-CM | POA: Diagnosis not present

## 2018-08-05 DIAGNOSIS — W34010S Accidental discharge of airgun, sequela: Secondary | ICD-10-CM | POA: Diagnosis not present

## 2018-08-05 DIAGNOSIS — I129 Hypertensive chronic kidney disease with stage 1 through stage 4 chronic kidney disease, or unspecified chronic kidney disease: Secondary | ICD-10-CM | POA: Diagnosis not present

## 2018-08-05 DIAGNOSIS — S81801A Unspecified open wound, right lower leg, initial encounter: Secondary | ICD-10-CM | POA: Diagnosis not present

## 2018-08-05 DIAGNOSIS — G4733 Obstructive sleep apnea (adult) (pediatric): Secondary | ICD-10-CM | POA: Diagnosis not present

## 2018-08-05 DIAGNOSIS — R600 Localized edema: Secondary | ICD-10-CM | POA: Diagnosis not present

## 2018-08-07 DIAGNOSIS — Z79891 Long term (current) use of opiate analgesic: Secondary | ICD-10-CM | POA: Diagnosis not present

## 2018-08-07 DIAGNOSIS — G894 Chronic pain syndrome: Secondary | ICD-10-CM | POA: Diagnosis not present

## 2018-08-07 DIAGNOSIS — M545 Low back pain: Secondary | ICD-10-CM | POA: Diagnosis not present

## 2018-08-07 DIAGNOSIS — M109 Gout, unspecified: Secondary | ICD-10-CM | POA: Diagnosis not present

## 2018-08-22 ENCOUNTER — Encounter: Admit: 2018-08-22 | Discharge: 2018-08-23 | Payer: MEDICARE | Attending: Vascular Surgery | Primary: Vascular Surgery

## 2018-08-22 DIAGNOSIS — N183 Chronic kidney disease, stage 3 (moderate): Secondary | ICD-10-CM | POA: Diagnosis not present

## 2018-08-22 DIAGNOSIS — G4733 Obstructive sleep apnea (adult) (pediatric): Secondary | ICD-10-CM | POA: Diagnosis not present

## 2018-08-22 DIAGNOSIS — L03115 Cellulitis of right lower limb: Secondary | ICD-10-CM | POA: Diagnosis not present

## 2018-08-22 DIAGNOSIS — S81801A Unspecified open wound, right lower leg, initial encounter: Secondary | ICD-10-CM | POA: Diagnosis not present

## 2018-08-22 DIAGNOSIS — I129 Hypertensive chronic kidney disease with stage 1 through stage 4 chronic kidney disease, or unspecified chronic kidney disease: Secondary | ICD-10-CM | POA: Diagnosis not present

## 2018-08-22 DIAGNOSIS — L97909 Non-pressure chronic ulcer of unspecified part of unspecified lower leg with unspecified severity: Secondary | ICD-10-CM | POA: Diagnosis not present

## 2018-08-23 ENCOUNTER — Encounter: Admit: 2018-08-23 | Discharge: 2018-08-24 | Payer: MEDICARE

## 2018-08-23 DIAGNOSIS — I87333 Chronic venous hypertension (idiopathic) with ulcer and inflammation of bilateral lower extremity: Secondary | ICD-10-CM | POA: Diagnosis not present

## 2018-08-23 DIAGNOSIS — L03115 Cellulitis of right lower limb: Secondary | ICD-10-CM | POA: Diagnosis not present

## 2018-08-24 MED ORDER — SILVER SULFADIAZINE 1 % TOPICAL CREAM
Freq: Every day | TOPICAL | 1 refills | 0 days | Status: CP
Start: 2018-08-24 — End: ?

## 2018-08-28 DIAGNOSIS — E669 Obesity, unspecified: Secondary | ICD-10-CM | POA: Diagnosis not present

## 2018-08-28 DIAGNOSIS — I10 Essential (primary) hypertension: Secondary | ICD-10-CM | POA: Diagnosis not present

## 2018-09-06 ENCOUNTER — Encounter: Admit: 2018-09-06 | Discharge: 2018-09-07 | Payer: MEDICARE

## 2018-09-06 DIAGNOSIS — L97819 Non-pressure chronic ulcer of other part of right lower leg with unspecified severity: Secondary | ICD-10-CM | POA: Diagnosis not present

## 2018-09-06 DIAGNOSIS — I872 Venous insufficiency (chronic) (peripheral): Secondary | ICD-10-CM | POA: Diagnosis not present

## 2018-09-06 DIAGNOSIS — X58XXXA Exposure to other specified factors, initial encounter: Secondary | ICD-10-CM | POA: Diagnosis not present

## 2018-09-06 DIAGNOSIS — L97219 Non-pressure chronic ulcer of right calf with unspecified severity: Secondary | ICD-10-CM | POA: Diagnosis not present

## 2018-09-06 DIAGNOSIS — R600 Localized edema: Secondary | ICD-10-CM | POA: Diagnosis not present

## 2018-09-06 DIAGNOSIS — L97229 Non-pressure chronic ulcer of left calf with unspecified severity: Secondary | ICD-10-CM | POA: Diagnosis not present

## 2018-09-06 DIAGNOSIS — S81801A Unspecified open wound, right lower leg, initial encounter: Secondary | ICD-10-CM | POA: Diagnosis not present

## 2018-09-06 DIAGNOSIS — L97919 Non-pressure chronic ulcer of unspecified part of right lower leg with unspecified severity: Principal | ICD-10-CM

## 2018-09-06 DIAGNOSIS — L97929 Non-pressure chronic ulcer of unspecified part of left lower leg with unspecified severity: Principal | ICD-10-CM

## 2018-09-17 MED ORDER — LIDOCAINE 4 % TOPICAL CREAM
Freq: Every day | TOPICAL | 6 refills | 0 days | Status: CP
Start: 2018-09-17 — End: 2019-09-17

## 2018-09-18 DIAGNOSIS — E669 Obesity, unspecified: Secondary | ICD-10-CM | POA: Diagnosis not present

## 2018-09-20 DIAGNOSIS — R69 Illness, unspecified: Secondary | ICD-10-CM | POA: Diagnosis not present

## 2018-09-26 DIAGNOSIS — I87333 Chronic venous hypertension (idiopathic) with ulcer and inflammation of bilateral lower extremity: Secondary | ICD-10-CM | POA: Diagnosis not present

## 2018-09-27 ENCOUNTER — Encounter: Admit: 2018-09-27 | Discharge: 2018-09-28 | Payer: MEDICARE

## 2018-09-27 DIAGNOSIS — L97213 Non-pressure chronic ulcer of right calf with necrosis of muscle: Secondary | ICD-10-CM | POA: Diagnosis not present

## 2018-09-27 DIAGNOSIS — I5030 Unspecified diastolic (congestive) heart failure: Principal | ICD-10-CM

## 2018-09-27 DIAGNOSIS — N183 Chronic kidney disease, stage 3 (moderate): Principal | ICD-10-CM

## 2018-09-27 DIAGNOSIS — Z9989 Dependence on other enabling machines and devices: Principal | ICD-10-CM

## 2018-09-27 DIAGNOSIS — M47817 Spondylosis without myelopathy or radiculopathy, lumbosacral region: Principal | ICD-10-CM

## 2018-09-27 DIAGNOSIS — I10 Essential (primary) hypertension: Principal | ICD-10-CM

## 2018-09-27 DIAGNOSIS — E559 Vitamin D deficiency, unspecified: Principal | ICD-10-CM

## 2018-09-27 DIAGNOSIS — L97223 Non-pressure chronic ulcer of left calf with necrosis of muscle: Principal | ICD-10-CM

## 2018-09-27 DIAGNOSIS — R609 Edema, unspecified: Principal | ICD-10-CM

## 2018-09-27 DIAGNOSIS — M109 Gout, unspecified: Principal | ICD-10-CM

## 2018-09-27 DIAGNOSIS — G4733 Obstructive sleep apnea (adult) (pediatric): Principal | ICD-10-CM

## 2018-09-27 MED ORDER — DOXYCYCLINE HYCLATE 100 MG TABLET
ORAL_TABLET | Freq: Two times a day (BID) | ORAL | 1 refills | 0.00000 days | Status: CP
Start: 2018-09-27 — End: 2018-10-11

## 2018-10-08 DIAGNOSIS — I87333 Chronic venous hypertension (idiopathic) with ulcer and inflammation of bilateral lower extremity: Secondary | ICD-10-CM | POA: Diagnosis not present

## 2018-10-10 DIAGNOSIS — I87333 Chronic venous hypertension (idiopathic) with ulcer and inflammation of bilateral lower extremity: Secondary | ICD-10-CM | POA: Diagnosis not present

## 2018-10-11 ENCOUNTER — Institutional Professional Consult (permissible substitution): Admit: 2018-10-11 | Discharge: 2018-10-12 | Payer: MEDICARE

## 2018-10-11 DIAGNOSIS — L97213 Non-pressure chronic ulcer of right calf with necrosis of muscle: Secondary | ICD-10-CM | POA: Diagnosis not present

## 2018-10-11 DIAGNOSIS — Z8679 Personal history of other diseases of the circulatory system: Secondary | ICD-10-CM | POA: Diagnosis not present

## 2018-10-11 DIAGNOSIS — L97223 Non-pressure chronic ulcer of left calf with necrosis of muscle: Secondary | ICD-10-CM | POA: Diagnosis not present

## 2018-10-11 DIAGNOSIS — N183 Chronic kidney disease, stage 3 (moderate): Secondary | ICD-10-CM | POA: Diagnosis not present

## 2018-10-11 DIAGNOSIS — R609 Edema, unspecified: Secondary | ICD-10-CM | POA: Diagnosis not present

## 2018-10-22 DIAGNOSIS — N183 Chronic kidney disease, stage 3 (moderate): Secondary | ICD-10-CM | POA: Diagnosis not present

## 2018-10-22 DIAGNOSIS — G894 Chronic pain syndrome: Secondary | ICD-10-CM | POA: Diagnosis not present

## 2018-10-22 MED ORDER — DOXYCYCLINE HYCLATE 100 MG TABLET
ORAL_TABLET | Freq: Two times a day (BID) | ORAL | 0 refills | 0.00000 days | Status: CP
Start: 2018-10-22 — End: 2018-10-23

## 2018-10-23 ENCOUNTER — Encounter: Admit: 2018-10-23 | Discharge: 2018-10-24 | Payer: MEDICARE

## 2018-10-23 DIAGNOSIS — L97223 Non-pressure chronic ulcer of left calf with necrosis of muscle: Secondary | ICD-10-CM | POA: Diagnosis not present

## 2018-10-23 DIAGNOSIS — I87333 Chronic venous hypertension (idiopathic) with ulcer and inflammation of bilateral lower extremity: Secondary | ICD-10-CM | POA: Diagnosis not present

## 2018-10-23 DIAGNOSIS — R609 Edema, unspecified: Secondary | ICD-10-CM | POA: Diagnosis not present

## 2018-10-23 DIAGNOSIS — L97213 Non-pressure chronic ulcer of right calf with necrosis of muscle: Secondary | ICD-10-CM | POA: Diagnosis not present

## 2018-10-24 MED ORDER — DOXYCYCLINE HYCLATE 100 MG TABLET
ORAL_TABLET | Freq: Two times a day (BID) | ORAL | 1 refills | 0 days | Status: SS
Start: 2018-10-24 — End: 2018-12-04

## 2018-10-24 MED ORDER — SANTYL 250 UNIT/GRAM TOPICAL OINTMENT
Freq: Every day | TOPICAL | 6 refills | 0 days | Status: CP
Start: 2018-10-24 — End: ?

## 2018-11-01 DIAGNOSIS — G4733 Obstructive sleep apnea (adult) (pediatric): Secondary | ICD-10-CM | POA: Diagnosis not present

## 2018-11-05 DIAGNOSIS — M545 Low back pain: Secondary | ICD-10-CM | POA: Diagnosis not present

## 2018-11-05 DIAGNOSIS — G894 Chronic pain syndrome: Secondary | ICD-10-CM | POA: Diagnosis not present

## 2018-11-05 DIAGNOSIS — Z79891 Long term (current) use of opiate analgesic: Secondary | ICD-10-CM | POA: Diagnosis not present

## 2018-11-05 DIAGNOSIS — M109 Gout, unspecified: Secondary | ICD-10-CM | POA: Diagnosis not present

## 2018-11-06 ENCOUNTER — Encounter: Admit: 2018-11-06 | Discharge: 2018-11-07 | Payer: MEDICARE

## 2018-11-06 DIAGNOSIS — R609 Edema, unspecified: Secondary | ICD-10-CM | POA: Diagnosis not present

## 2018-11-06 DIAGNOSIS — L97229 Non-pressure chronic ulcer of left calf with unspecified severity: Secondary | ICD-10-CM | POA: Diagnosis not present

## 2018-11-06 DIAGNOSIS — I509 Heart failure, unspecified: Secondary | ICD-10-CM | POA: Diagnosis not present

## 2018-11-06 DIAGNOSIS — E11622 Type 2 diabetes mellitus with other skin ulcer: Secondary | ICD-10-CM | POA: Diagnosis not present

## 2018-11-06 DIAGNOSIS — Z8679 Personal history of other diseases of the circulatory system: Secondary | ICD-10-CM | POA: Diagnosis not present

## 2018-11-06 DIAGNOSIS — L97223 Non-pressure chronic ulcer of left calf with necrosis of muscle: Secondary | ICD-10-CM | POA: Diagnosis not present

## 2018-11-06 DIAGNOSIS — E1152 Type 2 diabetes mellitus with diabetic peripheral angiopathy with gangrene: Secondary | ICD-10-CM | POA: Diagnosis not present

## 2018-11-06 DIAGNOSIS — L97219 Non-pressure chronic ulcer of right calf with unspecified severity: Secondary | ICD-10-CM | POA: Diagnosis not present

## 2018-11-06 DIAGNOSIS — L97213 Non-pressure chronic ulcer of right calf with necrosis of muscle: Secondary | ICD-10-CM | POA: Diagnosis not present

## 2018-11-08 DIAGNOSIS — I87333 Chronic venous hypertension (idiopathic) with ulcer and inflammation of bilateral lower extremity: Secondary | ICD-10-CM | POA: Diagnosis not present

## 2018-11-21 MED ORDER — DOXYCYCLINE HYCLATE 100 MG TABLET
ORAL_TABLET | Freq: Two times a day (BID) | ORAL | 0 refills | 0 days | Status: CP
Start: 2018-11-21 — End: 2018-12-06

## 2018-11-23 DIAGNOSIS — I519 Heart disease, unspecified: Secondary | ICD-10-CM | POA: Diagnosis not present

## 2018-11-23 DIAGNOSIS — G894 Chronic pain syndrome: Secondary | ICD-10-CM | POA: Diagnosis not present

## 2018-11-23 DIAGNOSIS — I10 Essential (primary) hypertension: Secondary | ICD-10-CM | POA: Diagnosis not present

## 2018-11-23 DIAGNOSIS — E669 Obesity, unspecified: Secondary | ICD-10-CM | POA: Diagnosis not present

## 2018-11-28 ENCOUNTER — Encounter: Admit: 2018-11-28 | Discharge: 2018-11-29 | Payer: MEDICARE

## 2018-11-28 DIAGNOSIS — I130 Hypertensive heart and chronic kidney disease with heart failure and stage 1 through stage 4 chronic kidney disease, or unspecified chronic kidney disease: Secondary | ICD-10-CM | POA: Diagnosis not present

## 2018-11-28 DIAGNOSIS — I2510 Atherosclerotic heart disease of native coronary artery without angina pectoris: Secondary | ICD-10-CM | POA: Diagnosis not present

## 2018-11-28 DIAGNOSIS — N183 Chronic kidney disease, stage 3 (moderate): Secondary | ICD-10-CM | POA: Diagnosis not present

## 2018-11-28 DIAGNOSIS — L03115 Cellulitis of right lower limb: Secondary | ICD-10-CM | POA: Diagnosis not present

## 2018-11-28 DIAGNOSIS — R69 Illness, unspecified: Secondary | ICD-10-CM | POA: Diagnosis not present

## 2018-11-28 DIAGNOSIS — H547 Unspecified visual loss: Secondary | ICD-10-CM | POA: Diagnosis not present

## 2018-11-28 DIAGNOSIS — I509 Heart failure, unspecified: Secondary | ICD-10-CM | POA: Diagnosis not present

## 2018-11-28 DIAGNOSIS — J45909 Unspecified asthma, uncomplicated: Secondary | ICD-10-CM | POA: Diagnosis not present

## 2018-11-28 DIAGNOSIS — G8929 Other chronic pain: Secondary | ICD-10-CM | POA: Diagnosis not present

## 2018-11-28 DIAGNOSIS — G4730 Sleep apnea, unspecified: Secondary | ICD-10-CM | POA: Diagnosis not present

## 2018-11-29 DIAGNOSIS — I87333 Chronic venous hypertension (idiopathic) with ulcer and inflammation of bilateral lower extremity: Secondary | ICD-10-CM | POA: Diagnosis not present

## 2018-12-02 DIAGNOSIS — M25461 Effusion, right knee: Secondary | ICD-10-CM | POA: Diagnosis not present

## 2018-12-02 DIAGNOSIS — R509 Fever, unspecified: Secondary | ICD-10-CM | POA: Diagnosis not present

## 2018-12-02 DIAGNOSIS — M25561 Pain in right knee: Secondary | ICD-10-CM | POA: Diagnosis not present

## 2018-12-02 DIAGNOSIS — Z20828 Contact with and (suspected) exposure to other viral communicable diseases: Secondary | ICD-10-CM | POA: Diagnosis not present

## 2018-12-02 DIAGNOSIS — L03115 Cellulitis of right lower limb: Principal | ICD-10-CM

## 2018-12-03 ENCOUNTER — Ambulatory Visit: Admit: 2018-12-03 | Discharge: 2018-12-06 | Disposition: A | Discharge status: Home / Self Care | Payer: MEDICARE

## 2018-12-03 DIAGNOSIS — M7041 Prepatellar bursitis, right knee: Secondary | ICD-10-CM | POA: Diagnosis not present

## 2018-12-03 DIAGNOSIS — G4733 Obstructive sleep apnea (adult) (pediatric): Secondary | ICD-10-CM | POA: Diagnosis not present

## 2018-12-03 DIAGNOSIS — Z6841 Body Mass Index (BMI) 40.0 and over, adult: Secondary | ICD-10-CM | POA: Diagnosis not present

## 2018-12-03 DIAGNOSIS — N183 Chronic kidney disease, stage 3 (moderate): Secondary | ICD-10-CM | POA: Diagnosis not present

## 2018-12-03 DIAGNOSIS — L03115 Cellulitis of right lower limb: Secondary | ICD-10-CM | POA: Diagnosis not present

## 2018-12-03 DIAGNOSIS — I5030 Unspecified diastolic (congestive) heart failure: Secondary | ICD-10-CM | POA: Diagnosis not present

## 2018-12-03 DIAGNOSIS — I5032 Chronic diastolic (congestive) heart failure: Secondary | ICD-10-CM | POA: Diagnosis not present

## 2018-12-03 DIAGNOSIS — R0600 Dyspnea, unspecified: Secondary | ICD-10-CM | POA: Diagnosis not present

## 2018-12-03 DIAGNOSIS — Z20828 Contact with and (suspected) exposure to other viral communicable diseases: Secondary | ICD-10-CM | POA: Diagnosis not present

## 2018-12-03 DIAGNOSIS — L02415 Cutaneous abscess of right lower limb: Secondary | ICD-10-CM | POA: Diagnosis not present

## 2018-12-03 DIAGNOSIS — L97911 Non-pressure chronic ulcer of unspecified part of right lower leg limited to breakdown of skin: Secondary | ICD-10-CM | POA: Diagnosis not present

## 2018-12-03 DIAGNOSIS — M25561 Pain in right knee: Secondary | ICD-10-CM | POA: Diagnosis not present

## 2018-12-03 DIAGNOSIS — L0291 Cutaneous abscess, unspecified: Secondary | ICD-10-CM | POA: Diagnosis not present

## 2018-12-03 DIAGNOSIS — M6281 Muscle weakness (generalized): Secondary | ICD-10-CM | POA: Diagnosis not present

## 2018-12-03 DIAGNOSIS — L97929 Non-pressure chronic ulcer of unspecified part of left lower leg with unspecified severity: Secondary | ICD-10-CM | POA: Diagnosis not present

## 2018-12-03 DIAGNOSIS — M25461 Effusion, right knee: Secondary | ICD-10-CM | POA: Diagnosis not present

## 2018-12-03 DIAGNOSIS — N400 Enlarged prostate without lower urinary tract symptoms: Secondary | ICD-10-CM | POA: Diagnosis not present

## 2018-12-03 DIAGNOSIS — M79661 Pain in right lower leg: Secondary | ICD-10-CM | POA: Diagnosis not present

## 2018-12-03 DIAGNOSIS — K219 Gastro-esophageal reflux disease without esophagitis: Secondary | ICD-10-CM | POA: Diagnosis not present

## 2018-12-03 DIAGNOSIS — R269 Unspecified abnormalities of gait and mobility: Secondary | ICD-10-CM | POA: Diagnosis not present

## 2018-12-03 DIAGNOSIS — Z66 Do not resuscitate: Secondary | ICD-10-CM | POA: Diagnosis not present

## 2018-12-03 DIAGNOSIS — I130 Hypertensive heart and chronic kidney disease with heart failure and stage 1 through stage 4 chronic kidney disease, or unspecified chronic kidney disease: Secondary | ICD-10-CM | POA: Diagnosis not present

## 2018-12-03 DIAGNOSIS — I10 Essential (primary) hypertension: Secondary | ICD-10-CM | POA: Diagnosis not present

## 2018-12-03 DIAGNOSIS — R509 Fever, unspecified: Secondary | ICD-10-CM | POA: Diagnosis not present

## 2018-12-03 DIAGNOSIS — L97213 Non-pressure chronic ulcer of right calf with necrosis of muscle: Principal | ICD-10-CM

## 2018-12-03 DIAGNOSIS — L97223 Non-pressure chronic ulcer of left calf with necrosis of muscle: Secondary | ICD-10-CM

## 2018-12-03 DIAGNOSIS — B999 Unspecified infectious disease: Secondary | ICD-10-CM

## 2018-12-03 DIAGNOSIS — R5081 Fever presenting with conditions classified elsewhere: Secondary | ICD-10-CM

## 2018-12-06 MED ORDER — CEPHALEXIN 500 MG CAPSULE
ORAL_CAPSULE | Freq: Four times a day (QID) | ORAL | 0 refills | 0 days | Status: CP
Start: 2018-12-06 — End: 2018-12-21

## 2018-12-10 ENCOUNTER — Ambulatory Visit
Admit: 2018-12-10 | Discharge: 2018-12-21 | Disposition: A | Discharge status: Home Health | Payer: MEDICARE | Admitting: Family Medicine

## 2018-12-10 ENCOUNTER — Encounter
Admit: 2018-12-10 | Discharge: 2018-12-21 | Disposition: A | Discharge status: Home Health | Payer: MEDICARE | Attending: Certified Registered" | Admitting: Family Medicine

## 2018-12-10 DIAGNOSIS — Z8739 Personal history of other diseases of the musculoskeletal system and connective tissue: Secondary | ICD-10-CM | POA: Diagnosis not present

## 2018-12-10 DIAGNOSIS — R112 Nausea with vomiting, unspecified: Secondary | ICD-10-CM | POA: Diagnosis not present

## 2018-12-10 DIAGNOSIS — Z6841 Body Mass Index (BMI) 40.0 and over, adult: Secondary | ICD-10-CM | POA: Diagnosis not present

## 2018-12-10 DIAGNOSIS — I5032 Chronic diastolic (congestive) heart failure: Secondary | ICD-10-CM | POA: Diagnosis not present

## 2018-12-10 DIAGNOSIS — I5030 Unspecified diastolic (congestive) heart failure: Secondary | ICD-10-CM | POA: Diagnosis not present

## 2018-12-10 DIAGNOSIS — M799 Soft tissue disorder, unspecified: Secondary | ICD-10-CM | POA: Diagnosis not present

## 2018-12-10 DIAGNOSIS — L97929 Non-pressure chronic ulcer of unspecified part of left lower leg with unspecified severity: Secondary | ICD-10-CM | POA: Diagnosis not present

## 2018-12-10 DIAGNOSIS — M7989 Other specified soft tissue disorders: Secondary | ICD-10-CM | POA: Diagnosis not present

## 2018-12-10 DIAGNOSIS — N183 Chronic kidney disease, stage 3 (moderate): Secondary | ICD-10-CM | POA: Diagnosis not present

## 2018-12-10 DIAGNOSIS — R1111 Vomiting without nausea: Secondary | ICD-10-CM | POA: Diagnosis not present

## 2018-12-10 DIAGNOSIS — M7121 Synovial cyst of popliteal space [Baker], right knee: Secondary | ICD-10-CM | POA: Diagnosis not present

## 2018-12-10 DIAGNOSIS — L02415 Cutaneous abscess of right lower limb: Secondary | ICD-10-CM | POA: Diagnosis not present

## 2018-12-10 DIAGNOSIS — M79604 Pain in right leg: Secondary | ICD-10-CM | POA: Diagnosis not present

## 2018-12-10 DIAGNOSIS — I130 Hypertensive heart and chronic kidney disease with heart failure and stage 1 through stage 4 chronic kidney disease, or unspecified chronic kidney disease: Secondary | ICD-10-CM | POA: Diagnosis not present

## 2018-12-10 DIAGNOSIS — E11622 Type 2 diabetes mellitus with other skin ulcer: Secondary | ICD-10-CM | POA: Diagnosis not present

## 2018-12-10 DIAGNOSIS — Z66 Do not resuscitate: Secondary | ICD-10-CM | POA: Diagnosis not present

## 2018-12-10 DIAGNOSIS — N179 Acute kidney failure, unspecified: Secondary | ICD-10-CM | POA: Diagnosis not present

## 2018-12-10 DIAGNOSIS — G4733 Obstructive sleep apnea (adult) (pediatric): Secondary | ICD-10-CM | POA: Diagnosis not present

## 2018-12-10 DIAGNOSIS — I10 Essential (primary) hypertension: Secondary | ICD-10-CM | POA: Diagnosis not present

## 2018-12-10 DIAGNOSIS — T148XXA Other injury of unspecified body region, initial encounter: Secondary | ICD-10-CM | POA: Diagnosis not present

## 2018-12-10 DIAGNOSIS — L03115 Cellulitis of right lower limb: Secondary | ICD-10-CM | POA: Diagnosis not present

## 2018-12-10 DIAGNOSIS — M4716 Other spondylosis with myelopathy, lumbar region: Secondary | ICD-10-CM | POA: Diagnosis not present

## 2018-12-10 DIAGNOSIS — L97919 Non-pressure chronic ulcer of unspecified part of right lower leg with unspecified severity: Secondary | ICD-10-CM | POA: Diagnosis not present

## 2018-12-10 DIAGNOSIS — M79661 Pain in right lower leg: Secondary | ICD-10-CM | POA: Diagnosis not present

## 2018-12-10 DIAGNOSIS — R509 Fever, unspecified: Secondary | ICD-10-CM | POA: Diagnosis not present

## 2018-12-10 DIAGNOSIS — L089 Local infection of the skin and subcutaneous tissue, unspecified: Secondary | ICD-10-CM | POA: Diagnosis not present

## 2018-12-10 DIAGNOSIS — M25461 Effusion, right knee: Secondary | ICD-10-CM | POA: Diagnosis not present

## 2018-12-10 DIAGNOSIS — Z9989 Dependence on other enabling machines and devices: Secondary | ICD-10-CM | POA: Diagnosis not present

## 2018-12-10 DIAGNOSIS — G894 Chronic pain syndrome: Secondary | ICD-10-CM | POA: Diagnosis not present

## 2018-12-11 DIAGNOSIS — I10 Essential (primary) hypertension: Secondary | ICD-10-CM | POA: Diagnosis not present

## 2018-12-11 DIAGNOSIS — L02415 Cutaneous abscess of right lower limb: Secondary | ICD-10-CM | POA: Diagnosis not present

## 2018-12-11 DIAGNOSIS — N183 Chronic kidney disease, stage 3 (moderate): Secondary | ICD-10-CM | POA: Diagnosis not present

## 2018-12-11 DIAGNOSIS — Z9989 Dependence on other enabling machines and devices: Secondary | ICD-10-CM | POA: Diagnosis not present

## 2018-12-11 DIAGNOSIS — G4733 Obstructive sleep apnea (adult) (pediatric): Secondary | ICD-10-CM | POA: Diagnosis not present

## 2018-12-12 DIAGNOSIS — Z9989 Dependence on other enabling machines and devices: Secondary | ICD-10-CM | POA: Diagnosis not present

## 2018-12-12 DIAGNOSIS — M25461 Effusion, right knee: Secondary | ICD-10-CM | POA: Diagnosis not present

## 2018-12-12 DIAGNOSIS — N183 Chronic kidney disease, stage 3 (moderate): Secondary | ICD-10-CM | POA: Diagnosis not present

## 2018-12-12 DIAGNOSIS — I10 Essential (primary) hypertension: Secondary | ICD-10-CM | POA: Diagnosis not present

## 2018-12-12 DIAGNOSIS — M7121 Synovial cyst of popliteal space [Baker], right knee: Secondary | ICD-10-CM | POA: Diagnosis not present

## 2018-12-12 DIAGNOSIS — L02415 Cutaneous abscess of right lower limb: Secondary | ICD-10-CM | POA: Diagnosis not present

## 2018-12-12 DIAGNOSIS — G4733 Obstructive sleep apnea (adult) (pediatric): Secondary | ICD-10-CM | POA: Diagnosis not present

## 2018-12-13 DIAGNOSIS — Z8739 Personal history of other diseases of the musculoskeletal system and connective tissue: Secondary | ICD-10-CM | POA: Diagnosis not present

## 2018-12-13 DIAGNOSIS — N183 Chronic kidney disease, stage 3 (moderate): Secondary | ICD-10-CM | POA: Diagnosis not present

## 2018-12-13 DIAGNOSIS — Z9989 Dependence on other enabling machines and devices: Secondary | ICD-10-CM | POA: Diagnosis not present

## 2018-12-13 DIAGNOSIS — G4733 Obstructive sleep apnea (adult) (pediatric): Secondary | ICD-10-CM | POA: Diagnosis not present

## 2018-12-13 DIAGNOSIS — L03115 Cellulitis of right lower limb: Secondary | ICD-10-CM | POA: Diagnosis not present

## 2018-12-13 DIAGNOSIS — L02415 Cutaneous abscess of right lower limb: Secondary | ICD-10-CM | POA: Diagnosis not present

## 2018-12-14 DIAGNOSIS — I10 Essential (primary) hypertension: Secondary | ICD-10-CM | POA: Diagnosis not present

## 2018-12-14 DIAGNOSIS — Z9989 Dependence on other enabling machines and devices: Secondary | ICD-10-CM | POA: Diagnosis not present

## 2018-12-14 DIAGNOSIS — L02415 Cutaneous abscess of right lower limb: Secondary | ICD-10-CM | POA: Diagnosis not present

## 2018-12-14 DIAGNOSIS — L03115 Cellulitis of right lower limb: Secondary | ICD-10-CM | POA: Diagnosis not present

## 2018-12-14 DIAGNOSIS — N183 Chronic kidney disease, stage 3 (moderate): Secondary | ICD-10-CM | POA: Diagnosis not present

## 2018-12-14 DIAGNOSIS — G4733 Obstructive sleep apnea (adult) (pediatric): Secondary | ICD-10-CM | POA: Diagnosis not present

## 2018-12-14 DIAGNOSIS — G894 Chronic pain syndrome: Secondary | ICD-10-CM | POA: Diagnosis not present

## 2018-12-15 DIAGNOSIS — L02415 Cutaneous abscess of right lower limb: Secondary | ICD-10-CM | POA: Diagnosis not present

## 2018-12-15 DIAGNOSIS — I10 Essential (primary) hypertension: Secondary | ICD-10-CM | POA: Diagnosis not present

## 2018-12-15 DIAGNOSIS — N183 Chronic kidney disease, stage 3 (moderate): Secondary | ICD-10-CM | POA: Diagnosis not present

## 2018-12-15 DIAGNOSIS — Z9989 Dependence on other enabling machines and devices: Secondary | ICD-10-CM | POA: Diagnosis not present

## 2018-12-15 DIAGNOSIS — G4733 Obstructive sleep apnea (adult) (pediatric): Secondary | ICD-10-CM | POA: Diagnosis not present

## 2018-12-16 DIAGNOSIS — Z9989 Dependence on other enabling machines and devices: Secondary | ICD-10-CM | POA: Diagnosis not present

## 2018-12-16 DIAGNOSIS — L02415 Cutaneous abscess of right lower limb: Secondary | ICD-10-CM | POA: Diagnosis not present

## 2018-12-16 DIAGNOSIS — N183 Chronic kidney disease, stage 3 (moderate): Secondary | ICD-10-CM | POA: Diagnosis not present

## 2018-12-16 DIAGNOSIS — G4733 Obstructive sleep apnea (adult) (pediatric): Secondary | ICD-10-CM | POA: Diagnosis not present

## 2018-12-16 DIAGNOSIS — I10 Essential (primary) hypertension: Secondary | ICD-10-CM | POA: Diagnosis not present

## 2018-12-17 DIAGNOSIS — N183 Chronic kidney disease, stage 3 (moderate): Secondary | ICD-10-CM | POA: Diagnosis not present

## 2018-12-17 DIAGNOSIS — N179 Acute kidney failure, unspecified: Secondary | ICD-10-CM | POA: Diagnosis not present

## 2018-12-17 DIAGNOSIS — L02415 Cutaneous abscess of right lower limb: Secondary | ICD-10-CM | POA: Diagnosis not present

## 2018-12-18 DIAGNOSIS — N183 Chronic kidney disease, stage 3 (moderate): Secondary | ICD-10-CM | POA: Diagnosis not present

## 2018-12-18 DIAGNOSIS — L02415 Cutaneous abscess of right lower limb: Secondary | ICD-10-CM | POA: Diagnosis not present

## 2018-12-18 DIAGNOSIS — I5030 Unspecified diastolic (congestive) heart failure: Secondary | ICD-10-CM | POA: Diagnosis not present

## 2018-12-18 DIAGNOSIS — N179 Acute kidney failure, unspecified: Secondary | ICD-10-CM | POA: Diagnosis not present

## 2018-12-19 DIAGNOSIS — R509 Fever, unspecified: Secondary | ICD-10-CM | POA: Diagnosis not present

## 2018-12-19 DIAGNOSIS — N183 Chronic kidney disease, stage 3 (moderate): Secondary | ICD-10-CM | POA: Diagnosis not present

## 2018-12-19 DIAGNOSIS — L089 Local infection of the skin and subcutaneous tissue, unspecified: Secondary | ICD-10-CM | POA: Diagnosis not present

## 2018-12-19 DIAGNOSIS — T148XXA Other injury of unspecified body region, initial encounter: Secondary | ICD-10-CM | POA: Diagnosis not present

## 2018-12-19 DIAGNOSIS — L02415 Cutaneous abscess of right lower limb: Secondary | ICD-10-CM | POA: Diagnosis not present

## 2018-12-19 DIAGNOSIS — N179 Acute kidney failure, unspecified: Secondary | ICD-10-CM | POA: Diagnosis not present

## 2018-12-19 DIAGNOSIS — R1111 Vomiting without nausea: Secondary | ICD-10-CM | POA: Diagnosis not present

## 2018-12-19 DIAGNOSIS — R112 Nausea with vomiting, unspecified: Secondary | ICD-10-CM | POA: Diagnosis not present

## 2018-12-20 DIAGNOSIS — R112 Nausea with vomiting, unspecified: Secondary | ICD-10-CM | POA: Diagnosis not present

## 2018-12-20 DIAGNOSIS — N179 Acute kidney failure, unspecified: Secondary | ICD-10-CM | POA: Diagnosis not present

## 2018-12-20 DIAGNOSIS — I5030 Unspecified diastolic (congestive) heart failure: Secondary | ICD-10-CM | POA: Diagnosis not present

## 2018-12-20 DIAGNOSIS — N183 Chronic kidney disease, stage 3 (moderate): Secondary | ICD-10-CM | POA: Diagnosis not present

## 2018-12-20 DIAGNOSIS — L02415 Cutaneous abscess of right lower limb: Secondary | ICD-10-CM | POA: Diagnosis not present

## 2018-12-21 DIAGNOSIS — M7989 Other specified soft tissue disorders: Secondary | ICD-10-CM | POA: Diagnosis not present

## 2018-12-21 DIAGNOSIS — N183 Chronic kidney disease, stage 3 (moderate): Secondary | ICD-10-CM | POA: Diagnosis not present

## 2018-12-21 DIAGNOSIS — G4733 Obstructive sleep apnea (adult) (pediatric): Secondary | ICD-10-CM | POA: Diagnosis not present

## 2018-12-21 DIAGNOSIS — L03115 Cellulitis of right lower limb: Secondary | ICD-10-CM | POA: Diagnosis not present

## 2018-12-21 DIAGNOSIS — L02415 Cutaneous abscess of right lower limb: Secondary | ICD-10-CM | POA: Diagnosis not present

## 2018-12-21 MED ORDER — METOCLOPRAMIDE 10 MG TABLET
ORAL_TABLET | Freq: Three times a day (TID) | ORAL | 0 refills | 0 days | Status: CP | PRN
Start: 2018-12-21 — End: 2018-12-31

## 2018-12-21 MED ORDER — FAMOTIDINE 20 MG TABLET
ORAL_TABLET | Freq: Two times a day (BID) | ORAL | 0 refills | 0 days | Status: CP
Start: 2018-12-21 — End: 2019-01-20

## 2018-12-21 MED ORDER — LINEZOLID 600 MG TABLET: 600 mg | tablet | Freq: Two times a day (BID) | 0 refills | 0 days | Status: AC

## 2018-12-21 MED ORDER — LINEZOLID 600 MG TABLET
ORAL_TABLET | Freq: Two times a day (BID) | ORAL | 0 refills | 0.00000 days | Status: CP
Start: 2018-12-21 — End: 2019-02-15

## 2018-12-23 DIAGNOSIS — L97812 Non-pressure chronic ulcer of other part of right lower leg with fat layer exposed: Secondary | ICD-10-CM | POA: Diagnosis not present

## 2018-12-23 DIAGNOSIS — I5020 Unspecified systolic (congestive) heart failure: Secondary | ICD-10-CM | POA: Diagnosis not present

## 2018-12-23 DIAGNOSIS — L02415 Cutaneous abscess of right lower limb: Secondary | ICD-10-CM | POA: Diagnosis not present

## 2018-12-23 DIAGNOSIS — I130 Hypertensive heart and chronic kidney disease with heart failure and stage 1 through stage 4 chronic kidney disease, or unspecified chronic kidney disease: Secondary | ICD-10-CM | POA: Diagnosis not present

## 2018-12-23 DIAGNOSIS — M71161 Other infective bursitis, right knee: Secondary | ICD-10-CM | POA: Diagnosis not present

## 2018-12-23 DIAGNOSIS — K219 Gastro-esophageal reflux disease without esophagitis: Secondary | ICD-10-CM | POA: Diagnosis not present

## 2018-12-23 DIAGNOSIS — M47817 Spondylosis without myelopathy or radiculopathy, lumbosacral region: Secondary | ICD-10-CM | POA: Diagnosis not present

## 2018-12-23 DIAGNOSIS — B951 Streptococcus, group B, as the cause of diseases classified elsewhere: Secondary | ICD-10-CM | POA: Diagnosis not present

## 2018-12-23 DIAGNOSIS — N183 Chronic kidney disease, stage 3 (moderate): Secondary | ICD-10-CM | POA: Diagnosis not present

## 2018-12-23 DIAGNOSIS — L97222 Non-pressure chronic ulcer of left calf with fat layer exposed: Secondary | ICD-10-CM | POA: Diagnosis not present

## 2018-12-24 DIAGNOSIS — L97812 Non-pressure chronic ulcer of other part of right lower leg with fat layer exposed: Secondary | ICD-10-CM | POA: Diagnosis not present

## 2018-12-24 DIAGNOSIS — L97222 Non-pressure chronic ulcer of left calf with fat layer exposed: Secondary | ICD-10-CM | POA: Diagnosis not present

## 2018-12-24 DIAGNOSIS — M71161 Other infective bursitis, right knee: Secondary | ICD-10-CM | POA: Diagnosis not present

## 2018-12-24 DIAGNOSIS — L03115 Cellulitis of right lower limb: Secondary | ICD-10-CM | POA: Diagnosis not present

## 2018-12-24 DIAGNOSIS — I5020 Unspecified systolic (congestive) heart failure: Secondary | ICD-10-CM | POA: Diagnosis not present

## 2018-12-24 DIAGNOSIS — L02415 Cutaneous abscess of right lower limb: Secondary | ICD-10-CM | POA: Diagnosis not present

## 2018-12-24 DIAGNOSIS — Z0000 Encounter for general adult medical examination without abnormal findings: Secondary | ICD-10-CM | POA: Diagnosis not present

## 2018-12-24 DIAGNOSIS — I130 Hypertensive heart and chronic kidney disease with heart failure and stage 1 through stage 4 chronic kidney disease, or unspecified chronic kidney disease: Secondary | ICD-10-CM | POA: Diagnosis not present

## 2018-12-24 DIAGNOSIS — Z1389 Encounter for screening for other disorder: Secondary | ICD-10-CM | POA: Diagnosis not present

## 2018-12-24 DIAGNOSIS — K219 Gastro-esophageal reflux disease without esophagitis: Secondary | ICD-10-CM | POA: Diagnosis not present

## 2018-12-24 DIAGNOSIS — M47817 Spondylosis without myelopathy or radiculopathy, lumbosacral region: Secondary | ICD-10-CM | POA: Diagnosis not present

## 2018-12-24 DIAGNOSIS — N183 Chronic kidney disease, stage 3 (moderate): Secondary | ICD-10-CM | POA: Diagnosis not present

## 2018-12-24 DIAGNOSIS — B951 Streptococcus, group B, as the cause of diseases classified elsewhere: Secondary | ICD-10-CM | POA: Diagnosis not present

## 2018-12-25 ENCOUNTER — Encounter: Admit: 2018-12-25 | Discharge: 2018-12-26 | Payer: MEDICARE

## 2018-12-25 DIAGNOSIS — I96 Gangrene, not elsewhere classified: Secondary | ICD-10-CM | POA: Diagnosis not present

## 2018-12-25 DIAGNOSIS — N183 Chronic kidney disease, stage 3 (moderate): Secondary | ICD-10-CM | POA: Diagnosis not present

## 2018-12-25 DIAGNOSIS — L97219 Non-pressure chronic ulcer of right calf with unspecified severity: Secondary | ICD-10-CM | POA: Diagnosis not present

## 2018-12-25 DIAGNOSIS — I872 Venous insufficiency (chronic) (peripheral): Secondary | ICD-10-CM | POA: Diagnosis not present

## 2018-12-25 DIAGNOSIS — L97229 Non-pressure chronic ulcer of left calf with unspecified severity: Secondary | ICD-10-CM | POA: Diagnosis not present

## 2018-12-25 DIAGNOSIS — L97213 Non-pressure chronic ulcer of right calf with necrosis of muscle: Secondary | ICD-10-CM | POA: Diagnosis not present

## 2018-12-25 DIAGNOSIS — L97223 Non-pressure chronic ulcer of left calf with necrosis of muscle: Secondary | ICD-10-CM

## 2018-12-26 DIAGNOSIS — M71161 Other infective bursitis, right knee: Secondary | ICD-10-CM | POA: Diagnosis not present

## 2018-12-26 DIAGNOSIS — I130 Hypertensive heart and chronic kidney disease with heart failure and stage 1 through stage 4 chronic kidney disease, or unspecified chronic kidney disease: Secondary | ICD-10-CM | POA: Diagnosis not present

## 2018-12-26 DIAGNOSIS — B951 Streptococcus, group B, as the cause of diseases classified elsewhere: Secondary | ICD-10-CM | POA: Diagnosis not present

## 2018-12-26 DIAGNOSIS — M47817 Spondylosis without myelopathy or radiculopathy, lumbosacral region: Secondary | ICD-10-CM | POA: Diagnosis not present

## 2018-12-26 DIAGNOSIS — K219 Gastro-esophageal reflux disease without esophagitis: Secondary | ICD-10-CM | POA: Diagnosis not present

## 2018-12-26 DIAGNOSIS — N183 Chronic kidney disease, stage 3 (moderate): Secondary | ICD-10-CM | POA: Diagnosis not present

## 2018-12-26 DIAGNOSIS — L02415 Cutaneous abscess of right lower limb: Secondary | ICD-10-CM | POA: Diagnosis not present

## 2018-12-26 DIAGNOSIS — I5020 Unspecified systolic (congestive) heart failure: Secondary | ICD-10-CM | POA: Diagnosis not present

## 2018-12-26 DIAGNOSIS — L97812 Non-pressure chronic ulcer of other part of right lower leg with fat layer exposed: Secondary | ICD-10-CM | POA: Diagnosis not present

## 2018-12-26 DIAGNOSIS — L97222 Non-pressure chronic ulcer of left calf with fat layer exposed: Secondary | ICD-10-CM | POA: Diagnosis not present

## 2018-12-27 DIAGNOSIS — K219 Gastro-esophageal reflux disease without esophagitis: Secondary | ICD-10-CM | POA: Diagnosis not present

## 2018-12-27 DIAGNOSIS — M47817 Spondylosis without myelopathy or radiculopathy, lumbosacral region: Secondary | ICD-10-CM | POA: Diagnosis not present

## 2018-12-27 DIAGNOSIS — B951 Streptococcus, group B, as the cause of diseases classified elsewhere: Secondary | ICD-10-CM | POA: Diagnosis not present

## 2018-12-27 DIAGNOSIS — I5020 Unspecified systolic (congestive) heart failure: Secondary | ICD-10-CM | POA: Diagnosis not present

## 2018-12-27 DIAGNOSIS — L97812 Non-pressure chronic ulcer of other part of right lower leg with fat layer exposed: Secondary | ICD-10-CM | POA: Diagnosis not present

## 2018-12-27 DIAGNOSIS — N183 Chronic kidney disease, stage 3 (moderate): Secondary | ICD-10-CM | POA: Diagnosis not present

## 2018-12-27 DIAGNOSIS — L02415 Cutaneous abscess of right lower limb: Secondary | ICD-10-CM | POA: Diagnosis not present

## 2018-12-27 DIAGNOSIS — M71161 Other infective bursitis, right knee: Secondary | ICD-10-CM | POA: Diagnosis not present

## 2018-12-27 DIAGNOSIS — I130 Hypertensive heart and chronic kidney disease with heart failure and stage 1 through stage 4 chronic kidney disease, or unspecified chronic kidney disease: Secondary | ICD-10-CM | POA: Diagnosis not present

## 2018-12-27 DIAGNOSIS — L97222 Non-pressure chronic ulcer of left calf with fat layer exposed: Secondary | ICD-10-CM | POA: Diagnosis not present

## 2018-12-31 DIAGNOSIS — I87333 Chronic venous hypertension (idiopathic) with ulcer and inflammation of bilateral lower extremity: Secondary | ICD-10-CM | POA: Diagnosis not present

## 2018-12-31 DIAGNOSIS — B951 Streptococcus, group B, as the cause of diseases classified elsewhere: Secondary | ICD-10-CM | POA: Diagnosis not present

## 2018-12-31 DIAGNOSIS — M109 Gout, unspecified: Secondary | ICD-10-CM | POA: Diagnosis not present

## 2018-12-31 DIAGNOSIS — G894 Chronic pain syndrome: Secondary | ICD-10-CM | POA: Diagnosis not present

## 2018-12-31 DIAGNOSIS — L02412 Cutaneous abscess of left axilla: Secondary | ICD-10-CM | POA: Diagnosis not present

## 2018-12-31 DIAGNOSIS — Z79891 Long term (current) use of opiate analgesic: Secondary | ICD-10-CM | POA: Diagnosis not present

## 2018-12-31 DIAGNOSIS — M545 Low back pain: Secondary | ICD-10-CM | POA: Diagnosis not present

## 2018-12-31 DIAGNOSIS — M71161 Other infective bursitis, right knee: Secondary | ICD-10-CM | POA: Diagnosis not present

## 2019-01-01 DIAGNOSIS — I130 Hypertensive heart and chronic kidney disease with heart failure and stage 1 through stage 4 chronic kidney disease, or unspecified chronic kidney disease: Secondary | ICD-10-CM | POA: Diagnosis not present

## 2019-01-01 DIAGNOSIS — L97812 Non-pressure chronic ulcer of other part of right lower leg with fat layer exposed: Secondary | ICD-10-CM | POA: Diagnosis not present

## 2019-01-01 DIAGNOSIS — M71161 Other infective bursitis, right knee: Secondary | ICD-10-CM | POA: Diagnosis not present

## 2019-01-01 DIAGNOSIS — N183 Chronic kidney disease, stage 3 (moderate): Secondary | ICD-10-CM | POA: Diagnosis not present

## 2019-01-01 DIAGNOSIS — L97222 Non-pressure chronic ulcer of left calf with fat layer exposed: Secondary | ICD-10-CM | POA: Diagnosis not present

## 2019-01-01 DIAGNOSIS — L02415 Cutaneous abscess of right lower limb: Secondary | ICD-10-CM | POA: Diagnosis not present

## 2019-01-01 DIAGNOSIS — B951 Streptococcus, group B, as the cause of diseases classified elsewhere: Secondary | ICD-10-CM | POA: Diagnosis not present

## 2019-01-01 DIAGNOSIS — I5020 Unspecified systolic (congestive) heart failure: Secondary | ICD-10-CM | POA: Diagnosis not present

## 2019-01-01 DIAGNOSIS — K219 Gastro-esophageal reflux disease without esophagitis: Secondary | ICD-10-CM | POA: Diagnosis not present

## 2019-01-01 DIAGNOSIS — M47817 Spondylosis without myelopathy or radiculopathy, lumbosacral region: Secondary | ICD-10-CM | POA: Diagnosis not present

## 2019-01-02 DIAGNOSIS — I87333 Chronic venous hypertension (idiopathic) with ulcer and inflammation of bilateral lower extremity: Secondary | ICD-10-CM | POA: Diagnosis not present

## 2019-01-03 ENCOUNTER — Ambulatory Visit
Admit: 2019-01-03 | Discharge: 2019-01-04 | Payer: MEDICARE | Attending: Orthopaedic Trauma | Primary: Orthopaedic Trauma

## 2019-01-03 DIAGNOSIS — L02415 Cutaneous abscess of right lower limb: Secondary | ICD-10-CM | POA: Diagnosis not present

## 2019-01-03 DIAGNOSIS — T8189XA Other complications of procedures, not elsewhere classified, initial encounter: Principal | ICD-10-CM

## 2019-01-08 ENCOUNTER — Encounter: Admit: 2019-01-08 | Discharge: 2019-01-09 | Payer: MEDICARE

## 2019-01-08 DIAGNOSIS — T8189XA Other complications of procedures, not elsewhere classified, initial encounter: Secondary | ICD-10-CM | POA: Diagnosis not present

## 2019-01-08 DIAGNOSIS — I10 Essential (primary) hypertension: Secondary | ICD-10-CM | POA: Diagnosis not present

## 2019-01-08 DIAGNOSIS — N183 Chronic kidney disease, stage 3 (moderate): Secondary | ICD-10-CM | POA: Diagnosis not present

## 2019-01-08 IMAGING — NM NM HEPATO W/GB/PHARM/[PERSON_NAME]
2 series · 12 of 12 positions shown · non-contrast
Comparison: Abdominal ultrasound dated 03/02/2017

CLINICAL DATA: Postprandial abdominal pain for 1 year.

EXAM:
NUCLEAR MEDICINE HEPATOBILIARY IMAGING WITH GALLBLADDER EF
TECHNIQUE: Sequential images of the abdomen were obtained [DATE] minutes
following intravenous administration of radiopharmaceutical. After
oral ingestion of Ensure, gallbladder ejection fraction was
determined. At 60 min, normal ejection fraction is greater than 33%.
RADIOPHARMACEUTICALS:  5.0 mCi Kc-UUm  Choletec IV

[Series 1000: gallbladder ef · 4.80mm/px · 6 of 120 frames shown]
[frame 11/120]
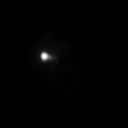
[frame 31/120]
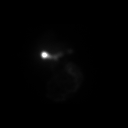
[frame 51/120]
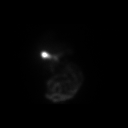
[frame 71/120]
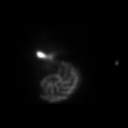
[frame 91/120]
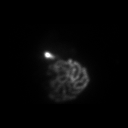
[frame 111/120]
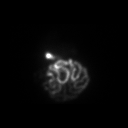

[Series 1000: hepatobiliary scan · 9.59mm/px · 6 of 60 frames shown]
[frame 6/60]
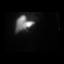
[frame 16/60]
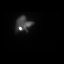
[frame 26/60]
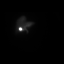
[frame 36/60]
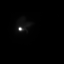
[frame 46/60]
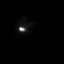
[frame 56/60]
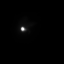

[12 of 12 positions shown; findings below may reference images not displayed]

FINDINGS: Satisfactory hepatocellular uptake of radiopharmaceutical from the
blood pool noted. Biliary and gallbladder activity by 8 minutes.
Bowel activity not initially seen but is visible after the oral
Ensure meal. The patient noted slight queasiness after drinking the
Ensure meal.

Calculated gallbladder ejection fraction is 79%. (Normal gallbladder
ejection fraction with Ensure is greater than 33%.)
IMPRESSION: 1. Normal hepatobiliary scan with gallbladder ejection fraction of
79%.

## 2019-01-09 DIAGNOSIS — I87333 Chronic venous hypertension (idiopathic) with ulcer and inflammation of bilateral lower extremity: Secondary | ICD-10-CM | POA: Diagnosis not present

## 2019-01-10 ENCOUNTER — Encounter: Admit: 2019-01-10 | Discharge: 2019-01-11 | Payer: MEDICARE

## 2019-01-10 DIAGNOSIS — I872 Venous insufficiency (chronic) (peripheral): Secondary | ICD-10-CM | POA: Diagnosis not present

## 2019-01-10 DIAGNOSIS — L97223 Non-pressure chronic ulcer of left calf with necrosis of muscle: Secondary | ICD-10-CM | POA: Diagnosis not present

## 2019-01-10 DIAGNOSIS — L97213 Non-pressure chronic ulcer of right calf with necrosis of muscle: Secondary | ICD-10-CM | POA: Diagnosis not present

## 2019-01-10 DIAGNOSIS — R609 Edema, unspecified: Secondary | ICD-10-CM | POA: Diagnosis not present

## 2019-01-10 MED ORDER — DOXYCYCLINE HYCLATE 100 MG TABLET
ORAL_TABLET | Freq: Two times a day (BID) | ORAL | 0 refills | 0.00000 days | Status: CP
Start: 2019-01-10 — End: 2019-01-22

## 2019-01-11 MED ORDER — CIPROFLOXACIN 500 MG TABLET
ORAL_TABLET | Freq: Two times a day (BID) | ORAL | 0 refills | 0 days | Status: CP
Start: 2019-01-11 — End: 2019-02-15

## 2019-01-14 ENCOUNTER — Encounter: Admit: 2019-01-14 | Discharge: 2019-01-14 | Payer: MEDICARE

## 2019-01-14 DIAGNOSIS — S81801A Unspecified open wound, right lower leg, initial encounter: Secondary | ICD-10-CM | POA: Diagnosis not present

## 2019-01-14 DIAGNOSIS — I872 Venous insufficiency (chronic) (peripheral): Secondary | ICD-10-CM | POA: Diagnosis not present

## 2019-01-14 DIAGNOSIS — L97919 Non-pressure chronic ulcer of unspecified part of right lower leg with unspecified severity: Secondary | ICD-10-CM | POA: Diagnosis not present

## 2019-01-14 DIAGNOSIS — R609 Edema, unspecified: Secondary | ICD-10-CM | POA: Diagnosis not present

## 2019-01-14 DIAGNOSIS — I5041 Acute combined systolic (congestive) and diastolic (congestive) heart failure: Secondary | ICD-10-CM | POA: Diagnosis not present

## 2019-01-14 DIAGNOSIS — N189 Chronic kidney disease, unspecified: Secondary | ICD-10-CM | POA: Diagnosis not present

## 2019-01-14 DIAGNOSIS — I5030 Unspecified diastolic (congestive) heart failure: Secondary | ICD-10-CM | POA: Diagnosis not present

## 2019-01-14 DIAGNOSIS — Z8679 Personal history of other diseases of the circulatory system: Secondary | ICD-10-CM | POA: Diagnosis not present

## 2019-01-14 DIAGNOSIS — R601 Generalized edema: Secondary | ICD-10-CM | POA: Diagnosis not present

## 2019-01-14 DIAGNOSIS — I10 Essential (primary) hypertension: Secondary | ICD-10-CM | POA: Diagnosis not present

## 2019-01-14 DIAGNOSIS — G4733 Obstructive sleep apnea (adult) (pediatric): Secondary | ICD-10-CM | POA: Diagnosis not present

## 2019-01-14 DIAGNOSIS — L97929 Non-pressure chronic ulcer of unspecified part of left lower leg with unspecified severity: Secondary | ICD-10-CM | POA: Diagnosis not present

## 2019-01-14 DIAGNOSIS — L97223 Non-pressure chronic ulcer of left calf with necrosis of muscle: Secondary | ICD-10-CM | POA: Diagnosis not present

## 2019-01-14 DIAGNOSIS — E669 Obesity, unspecified: Secondary | ICD-10-CM | POA: Diagnosis not present

## 2019-01-14 DIAGNOSIS — R69 Illness, unspecified: Secondary | ICD-10-CM | POA: Diagnosis not present

## 2019-01-14 DIAGNOSIS — I130 Hypertensive heart and chronic kidney disease with heart failure and stage 1 through stage 4 chronic kidney disease, or unspecified chronic kidney disease: Secondary | ICD-10-CM | POA: Diagnosis not present

## 2019-01-14 DIAGNOSIS — I87333 Chronic venous hypertension (idiopathic) with ulcer and inflammation of bilateral lower extremity: Secondary | ICD-10-CM | POA: Diagnosis not present

## 2019-01-14 DIAGNOSIS — Z6841 Body Mass Index (BMI) 40.0 and over, adult: Secondary | ICD-10-CM | POA: Diagnosis not present

## 2019-01-14 MED ORDER — BUMETANIDE 1 MG TABLET
ORAL_TABLET | Freq: Two times a day (BID) | ORAL | 6 refills | 15 days | Status: CP
Start: 2019-01-14 — End: 2019-01-22

## 2019-01-14 MED ORDER — ATORVASTATIN 40 MG TABLET
ORAL_TABLET | Freq: Every day | ORAL | 6 refills | 30 days | Status: CP
Start: 2019-01-14 — End: 2019-02-13

## 2019-01-22 ENCOUNTER — Encounter: Admit: 2019-01-22 | Discharge: 2019-01-22 | Payer: MEDICARE | Attending: Adult Health | Primary: Adult Health

## 2019-01-22 ENCOUNTER — Encounter: Admit: 2019-01-22 | Discharge: 2019-01-22 | Payer: MEDICARE

## 2019-01-22 DIAGNOSIS — L97819 Non-pressure chronic ulcer of other part of right lower leg with unspecified severity: Secondary | ICD-10-CM | POA: Diagnosis not present

## 2019-01-22 DIAGNOSIS — I5033 Acute on chronic diastolic (congestive) heart failure: Secondary | ICD-10-CM | POA: Diagnosis not present

## 2019-01-22 DIAGNOSIS — I130 Hypertensive heart and chronic kidney disease with heart failure and stage 1 through stage 4 chronic kidney disease, or unspecified chronic kidney disease: Secondary | ICD-10-CM | POA: Diagnosis not present

## 2019-01-22 DIAGNOSIS — N183 Chronic kidney disease, stage 3 (moderate): Secondary | ICD-10-CM | POA: Diagnosis not present

## 2019-01-22 DIAGNOSIS — Z7982 Long term (current) use of aspirin: Secondary | ICD-10-CM | POA: Diagnosis not present

## 2019-01-22 DIAGNOSIS — L97223 Non-pressure chronic ulcer of left calf with necrosis of muscle: Secondary | ICD-10-CM | POA: Diagnosis not present

## 2019-01-22 DIAGNOSIS — I83218 Varicose veins of right lower extremity with both ulcer of other part of lower extremity and inflammation: Secondary | ICD-10-CM | POA: Diagnosis not present

## 2019-01-22 DIAGNOSIS — L97829 Non-pressure chronic ulcer of other part of left lower leg with unspecified severity: Secondary | ICD-10-CM | POA: Diagnosis not present

## 2019-01-22 DIAGNOSIS — I83228 Varicose veins of left lower extremity with both ulcer of other part of lower extremity and inflammation: Secondary | ICD-10-CM | POA: Diagnosis not present

## 2019-01-22 DIAGNOSIS — I890 Lymphedema, not elsewhere classified: Secondary | ICD-10-CM | POA: Diagnosis not present

## 2019-01-22 DIAGNOSIS — I5032 Chronic diastolic (congestive) heart failure: Secondary | ICD-10-CM | POA: Diagnosis not present

## 2019-01-22 DIAGNOSIS — R609 Edema, unspecified: Secondary | ICD-10-CM

## 2019-01-22 DIAGNOSIS — L97213 Non-pressure chronic ulcer of right calf with necrosis of muscle: Secondary | ICD-10-CM

## 2019-01-22 MED ORDER — BUMETANIDE 1 MG TABLET
ORAL_TABLET | Freq: Every day | ORAL | 5 refills | 30 days | Status: CP
Start: 2019-01-22 — End: 2019-02-05

## 2019-02-04 ENCOUNTER — Ambulatory Visit: Admit: 2019-02-04 | Discharge: 2019-02-04 | Payer: MEDICARE

## 2019-02-04 ENCOUNTER — Encounter: Admit: 2019-02-04 | Discharge: 2019-02-04 | Payer: MEDICARE

## 2019-02-04 DIAGNOSIS — I358 Other nonrheumatic aortic valve disorders: Secondary | ICD-10-CM | POA: Diagnosis not present

## 2019-02-04 DIAGNOSIS — I5032 Chronic diastolic (congestive) heart failure: Secondary | ICD-10-CM | POA: Diagnosis not present

## 2019-02-04 DIAGNOSIS — Z66 Do not resuscitate: Secondary | ICD-10-CM | POA: Diagnosis not present

## 2019-02-04 DIAGNOSIS — Z8679 Personal history of other diseases of the circulatory system: Secondary | ICD-10-CM | POA: Diagnosis not present

## 2019-02-04 DIAGNOSIS — R601 Generalized edema: Secondary | ICD-10-CM | POA: Diagnosis not present

## 2019-02-05 MED ORDER — BUMETANIDE 1 MG TABLET
ORAL_TABLET | Freq: Every day | ORAL | 5 refills | 30.00000 days | PRN
Start: 2019-02-05 — End: 2019-08-04

## 2019-02-11 ENCOUNTER — Encounter: Admit: 2019-02-11 | Discharge: 2019-02-12 | Payer: MEDICARE

## 2019-02-11 DIAGNOSIS — N183 Chronic kidney disease, stage 3 (moderate): Secondary | ICD-10-CM | POA: Diagnosis not present

## 2019-02-11 DIAGNOSIS — G4733 Obstructive sleep apnea (adult) (pediatric): Secondary | ICD-10-CM | POA: Diagnosis not present

## 2019-02-11 DIAGNOSIS — I10 Essential (primary) hypertension: Secondary | ICD-10-CM | POA: Diagnosis not present

## 2019-02-11 DIAGNOSIS — R3129 Other microscopic hematuria: Secondary | ICD-10-CM | POA: Diagnosis not present

## 2019-02-12 MED ORDER — HYDROCHLOROTHIAZIDE 12.5 MG TABLET
ORAL_TABLET | Freq: Every day | ORAL | 11 refills | 30 days | Status: CP
Start: 2019-02-12 — End: 2020-02-12

## 2019-02-13 ENCOUNTER — Encounter: Admit: 2019-02-13 | Discharge: 2019-02-14 | Payer: MEDICARE | Attending: Family | Primary: Family

## 2019-02-13 DIAGNOSIS — I87333 Chronic venous hypertension (idiopathic) with ulcer and inflammation of bilateral lower extremity: Secondary | ICD-10-CM | POA: Diagnosis not present

## 2019-02-13 DIAGNOSIS — Z1159 Encounter for screening for other viral diseases: Secondary | ICD-10-CM | POA: Diagnosis not present

## 2019-02-14 DIAGNOSIS — L03115 Cellulitis of right lower limb: Principal | ICD-10-CM

## 2019-02-15 ENCOUNTER — Encounter
Admit: 2019-02-15 | Discharge: 2019-02-15 | Payer: MEDICARE | Attending: Student in an Organized Health Care Education/Training Program | Primary: Student in an Organized Health Care Education/Training Program

## 2019-02-15 ENCOUNTER — Encounter: Admit: 2019-02-15 | Discharge: 2019-02-15 | Payer: MEDICARE

## 2019-02-15 DIAGNOSIS — I130 Hypertensive heart and chronic kidney disease with heart failure and stage 1 through stage 4 chronic kidney disease, or unspecified chronic kidney disease: Secondary | ICD-10-CM | POA: Diagnosis not present

## 2019-02-15 DIAGNOSIS — Z6841 Body Mass Index (BMI) 40.0 and over, adult: Secondary | ICD-10-CM | POA: Diagnosis not present

## 2019-02-15 DIAGNOSIS — L97819 Non-pressure chronic ulcer of other part of right lower leg with unspecified severity: Secondary | ICD-10-CM | POA: Diagnosis not present

## 2019-02-15 DIAGNOSIS — G4733 Obstructive sleep apnea (adult) (pediatric): Secondary | ICD-10-CM | POA: Diagnosis not present

## 2019-02-15 DIAGNOSIS — I5030 Unspecified diastolic (congestive) heart failure: Secondary | ICD-10-CM | POA: Diagnosis not present

## 2019-02-15 DIAGNOSIS — Z7982 Long term (current) use of aspirin: Secondary | ICD-10-CM | POA: Diagnosis not present

## 2019-02-15 DIAGNOSIS — L03115 Cellulitis of right lower limb: Secondary | ICD-10-CM | POA: Diagnosis not present

## 2019-02-15 DIAGNOSIS — L02415 Cutaneous abscess of right lower limb: Secondary | ICD-10-CM | POA: Diagnosis not present

## 2019-02-15 DIAGNOSIS — Z87891 Personal history of nicotine dependence: Secondary | ICD-10-CM | POA: Diagnosis not present

## 2019-02-15 DIAGNOSIS — N183 Chronic kidney disease, stage 3 (moderate): Secondary | ICD-10-CM | POA: Diagnosis not present

## 2019-02-15 MED ORDER — OXYCODONE 5 MG TABLET: 5 mg | tablet | 0 refills | 2 days | Status: AC

## 2019-02-15 MED ORDER — SULFAMETHOXAZOLE 800 MG-TRIMETHOPRIM 160 MG TABLET
ORAL_TABLET | Freq: Two times a day (BID) | ORAL | 0 refills | 5.00000 days | Status: CP
Start: 2019-02-15 — End: 2019-02-20

## 2019-02-15 MED ORDER — OXYCODONE 5 MG TABLET
ORAL_TABLET | ORAL | 0 refills | 3.00000 days | Status: CP | PRN
Start: 2019-02-15 — End: 2019-02-20

## 2019-02-21 ENCOUNTER — Encounter: Admit: 2019-02-21 | Discharge: 2019-02-22 | Payer: MEDICARE

## 2019-02-21 DIAGNOSIS — E669 Obesity, unspecified: Secondary | ICD-10-CM | POA: Diagnosis not present

## 2019-02-21 DIAGNOSIS — I10 Essential (primary) hypertension: Secondary | ICD-10-CM | POA: Diagnosis not present

## 2019-02-21 DIAGNOSIS — L97221 Non-pressure chronic ulcer of left calf limited to breakdown of skin: Secondary | ICD-10-CM | POA: Diagnosis not present

## 2019-02-21 DIAGNOSIS — I87333 Chronic venous hypertension (idiopathic) with ulcer and inflammation of bilateral lower extremity: Secondary | ICD-10-CM | POA: Diagnosis not present

## 2019-02-21 DIAGNOSIS — R609 Edema, unspecified: Secondary | ICD-10-CM

## 2019-02-21 DIAGNOSIS — L97213 Non-pressure chronic ulcer of right calf with necrosis of muscle: Secondary | ICD-10-CM

## 2019-02-26 ENCOUNTER — Encounter: Admit: 2019-02-26 | Discharge: 2019-02-27 | Payer: MEDICARE

## 2019-02-26 DIAGNOSIS — T8189XD Other complications of procedures, not elsewhere classified, subsequent encounter: Secondary | ICD-10-CM | POA: Diagnosis not present

## 2019-02-26 DIAGNOSIS — G894 Chronic pain syndrome: Secondary | ICD-10-CM | POA: Diagnosis not present

## 2019-02-26 DIAGNOSIS — Z4889 Encounter for other specified surgical aftercare: Secondary | ICD-10-CM | POA: Diagnosis not present

## 2019-02-26 DIAGNOSIS — M109 Gout, unspecified: Secondary | ICD-10-CM | POA: Diagnosis not present

## 2019-02-26 DIAGNOSIS — M545 Low back pain: Secondary | ICD-10-CM | POA: Diagnosis not present

## 2019-02-26 DIAGNOSIS — Z79891 Long term (current) use of opiate analgesic: Secondary | ICD-10-CM | POA: Diagnosis not present

## 2019-02-26 DIAGNOSIS — T8189XA Other complications of procedures, not elsewhere classified, initial encounter: Principal | ICD-10-CM

## 2019-03-08 DIAGNOSIS — I10 Essential (primary) hypertension: Secondary | ICD-10-CM | POA: Diagnosis not present

## 2019-03-08 DIAGNOSIS — R7309 Other abnormal glucose: Secondary | ICD-10-CM | POA: Diagnosis not present

## 2019-03-12 ENCOUNTER — Encounter: Admit: 2019-03-12 | Discharge: 2019-03-13 | Payer: MEDICARE

## 2019-03-12 DIAGNOSIS — S81801D Unspecified open wound, right lower leg, subsequent encounter: Secondary | ICD-10-CM | POA: Diagnosis not present

## 2019-03-12 DIAGNOSIS — Z4889 Encounter for other specified surgical aftercare: Secondary | ICD-10-CM | POA: Diagnosis not present

## 2019-03-20 ENCOUNTER — Ambulatory Visit: Admit: 2019-03-20 | Discharge: 2019-03-21 | Payer: MEDICARE

## 2019-03-20 DIAGNOSIS — N183 Chronic kidney disease, stage 3 (moderate): Secondary | ICD-10-CM | POA: Diagnosis not present

## 2019-03-21 ENCOUNTER — Ambulatory Visit: Admit: 2019-03-21 | Discharge: 2019-03-22 | Payer: MEDICARE

## 2019-03-21 DIAGNOSIS — R600 Localized edema: Secondary | ICD-10-CM | POA: Diagnosis not present

## 2019-03-21 DIAGNOSIS — I83218 Varicose veins of right lower extremity with both ulcer of other part of lower extremity and inflammation: Secondary | ICD-10-CM | POA: Diagnosis not present

## 2019-03-21 DIAGNOSIS — L97229 Non-pressure chronic ulcer of left calf with unspecified severity: Secondary | ICD-10-CM | POA: Diagnosis not present

## 2019-03-21 DIAGNOSIS — I83228 Varicose veins of left lower extremity with both ulcer of other part of lower extremity and inflammation: Secondary | ICD-10-CM | POA: Diagnosis not present

## 2019-03-21 DIAGNOSIS — I96 Gangrene, not elsewhere classified: Secondary | ICD-10-CM | POA: Diagnosis not present

## 2019-03-21 DIAGNOSIS — L97221 Non-pressure chronic ulcer of left calf limited to breakdown of skin: Secondary | ICD-10-CM | POA: Diagnosis not present

## 2019-03-21 DIAGNOSIS — L97219 Non-pressure chronic ulcer of right calf with unspecified severity: Secondary | ICD-10-CM | POA: Diagnosis not present

## 2019-03-21 DIAGNOSIS — R609 Edema, unspecified: Secondary | ICD-10-CM

## 2019-03-21 DIAGNOSIS — L97819 Non-pressure chronic ulcer of other part of right lower leg with unspecified severity: Secondary | ICD-10-CM

## 2019-03-21 DIAGNOSIS — L97213 Non-pressure chronic ulcer of right calf with necrosis of muscle: Secondary | ICD-10-CM

## 2019-03-25 DIAGNOSIS — I87333 Chronic venous hypertension (idiopathic) with ulcer and inflammation of bilateral lower extremity: Secondary | ICD-10-CM | POA: Diagnosis not present

## 2019-04-11 ENCOUNTER — Encounter: Admit: 2019-04-11 | Discharge: 2019-04-12 | Payer: MEDICARE

## 2019-04-11 DIAGNOSIS — L97819 Non-pressure chronic ulcer of other part of right lower leg with unspecified severity: Secondary | ICD-10-CM | POA: Diagnosis not present

## 2019-04-11 DIAGNOSIS — L309 Dermatitis, unspecified: Secondary | ICD-10-CM | POA: Diagnosis not present

## 2019-04-11 DIAGNOSIS — I83018 Varicose veins of right lower extremity with ulcer other part of lower leg: Secondary | ICD-10-CM | POA: Diagnosis not present

## 2019-04-11 DIAGNOSIS — L97213 Non-pressure chronic ulcer of right calf with necrosis of muscle: Secondary | ICD-10-CM | POA: Diagnosis not present

## 2019-04-11 DIAGNOSIS — I83028 Varicose veins of left lower extremity with ulcer other part of lower leg: Secondary | ICD-10-CM | POA: Diagnosis not present

## 2019-04-11 DIAGNOSIS — L97829 Non-pressure chronic ulcer of other part of left lower leg with unspecified severity: Secondary | ICD-10-CM | POA: Diagnosis not present

## 2019-04-11 DIAGNOSIS — Z66 Do not resuscitate: Secondary | ICD-10-CM | POA: Diagnosis not present

## 2019-04-11 MED ORDER — CEPHALEXIN 500 MG CAPSULE
ORAL_CAPSULE | Freq: Three times a day (TID) | ORAL | 0 refills | 5 days | Status: CP
Start: 2019-04-11 — End: 2019-04-16

## 2019-04-11 MED ORDER — TRIAMCINOLONE ACETONIDE 0.1 % TOPICAL CREAM
Freq: Two times a day (BID) | TOPICAL | 0 refills | 0 days | Status: CP
Start: 2019-04-11 — End: ?

## 2019-04-16 DIAGNOSIS — N1831 Chronic kidney disease, stage 3a: Secondary | ICD-10-CM | POA: Diagnosis not present

## 2019-04-16 DIAGNOSIS — Z125 Encounter for screening for malignant neoplasm of prostate: Secondary | ICD-10-CM | POA: Diagnosis not present

## 2019-04-16 DIAGNOSIS — E559 Vitamin D deficiency, unspecified: Secondary | ICD-10-CM | POA: Diagnosis not present

## 2019-04-16 DIAGNOSIS — E669 Obesity, unspecified: Secondary | ICD-10-CM | POA: Diagnosis not present

## 2019-04-16 DIAGNOSIS — L03115 Cellulitis of right lower limb: Secondary | ICD-10-CM | POA: Diagnosis not present

## 2019-04-16 DIAGNOSIS — I10 Essential (primary) hypertension: Secondary | ICD-10-CM | POA: Diagnosis not present

## 2019-04-16 DIAGNOSIS — N183 Chronic kidney disease, stage 3 unspecified: Secondary | ICD-10-CM | POA: Diagnosis not present

## 2019-04-16 MED ORDER — BUMETANIDE 1 MG TABLET: 1 mg | tablet | Freq: Every day | 1 refills | 90 days

## 2019-04-17 DIAGNOSIS — G894 Chronic pain syndrome: Secondary | ICD-10-CM | POA: Diagnosis not present

## 2019-04-17 DIAGNOSIS — M109 Gout, unspecified: Secondary | ICD-10-CM | POA: Diagnosis not present

## 2019-04-17 DIAGNOSIS — M545 Low back pain: Secondary | ICD-10-CM | POA: Diagnosis not present

## 2019-04-17 DIAGNOSIS — Z5181 Encounter for therapeutic drug level monitoring: Secondary | ICD-10-CM | POA: Diagnosis not present

## 2019-04-17 DIAGNOSIS — Z79891 Long term (current) use of opiate analgesic: Secondary | ICD-10-CM | POA: Diagnosis not present

## 2019-04-19 DIAGNOSIS — R69 Illness, unspecified: Secondary | ICD-10-CM | POA: Diagnosis not present

## 2019-04-19 DIAGNOSIS — I10 Essential (primary) hypertension: Secondary | ICD-10-CM | POA: Diagnosis not present

## 2019-04-22 ENCOUNTER — Ambulatory Visit: Admit: 2019-04-22 | Discharge: 2019-04-23 | Payer: MEDICARE

## 2019-04-22 MED ORDER — ATORVASTATIN 40 MG TABLET: 40 mg | tablet | Freq: Every day | 6 refills | 30 days | Status: AC

## 2019-05-16 ENCOUNTER — Encounter: Admit: 2019-05-16 | Discharge: 2019-05-17 | Payer: MEDICARE

## 2019-05-16 DIAGNOSIS — I10 Essential (primary) hypertension: Secondary | ICD-10-CM | POA: Diagnosis not present

## 2019-05-16 DIAGNOSIS — N183 Chronic kidney disease, stage 3 unspecified: Secondary | ICD-10-CM | POA: Diagnosis not present

## 2019-05-17 DIAGNOSIS — I10 Essential (primary) hypertension: Secondary | ICD-10-CM | POA: Diagnosis not present

## 2019-05-17 DIAGNOSIS — R69 Illness, unspecified: Secondary | ICD-10-CM | POA: Diagnosis not present

## 2019-05-28 ENCOUNTER — Encounter: Admit: 2019-05-28 | Discharge: 2019-05-29 | Payer: MEDICARE

## 2019-05-28 DIAGNOSIS — Z9989 Dependence on other enabling machines and devices: Secondary | ICD-10-CM | POA: Diagnosis not present

## 2019-05-28 DIAGNOSIS — G4733 Obstructive sleep apnea (adult) (pediatric): Secondary | ICD-10-CM | POA: Diagnosis not present

## 2019-05-28 DIAGNOSIS — I129 Hypertensive chronic kidney disease with stage 1 through stage 4 chronic kidney disease, or unspecified chronic kidney disease: Secondary | ICD-10-CM | POA: Diagnosis not present

## 2019-05-28 DIAGNOSIS — I83012 Varicose veins of right lower extremity with ulcer of calf: Secondary | ICD-10-CM | POA: Diagnosis not present

## 2019-05-28 DIAGNOSIS — L97211 Non-pressure chronic ulcer of right calf limited to breakdown of skin: Secondary | ICD-10-CM | POA: Diagnosis not present

## 2019-05-28 DIAGNOSIS — N183 Chronic kidney disease, stage 3 unspecified: Secondary | ICD-10-CM | POA: Diagnosis not present

## 2019-06-05 DIAGNOSIS — I87333 Chronic venous hypertension (idiopathic) with ulcer and inflammation of bilateral lower extremity: Secondary | ICD-10-CM | POA: Diagnosis not present

## 2019-06-08 ENCOUNTER — Encounter
Admit: 2019-06-08 | Discharge: 2019-06-08 | Disposition: A | Discharge status: Home / Self Care | Payer: MEDICARE | Attending: Family

## 2019-06-08 DIAGNOSIS — Z79899 Other long term (current) drug therapy: Secondary | ICD-10-CM | POA: Diagnosis not present

## 2019-06-08 DIAGNOSIS — I129 Hypertensive chronic kidney disease with stage 1 through stage 4 chronic kidney disease, or unspecified chronic kidney disease: Secondary | ICD-10-CM | POA: Diagnosis not present

## 2019-06-08 DIAGNOSIS — T783XXA Angioneurotic edema, initial encounter: Secondary | ICD-10-CM | POA: Diagnosis not present

## 2019-06-08 DIAGNOSIS — N183 Chronic kidney disease, stage 3 unspecified: Secondary | ICD-10-CM | POA: Diagnosis not present

## 2019-06-08 MED ORDER — PREDNISONE 20 MG TABLET
ORAL_TABLET | ORAL | 0 refills | 0.00000 days | Status: CP
Start: 2019-06-08 — End: 2019-06-08

## 2019-06-08 MED ORDER — PREDNISONE 20 MG TABLET: tablet | 0 refills | 0 days | Status: AC

## 2019-06-17 ENCOUNTER — Encounter: Admit: 2019-06-17 | Discharge: 2019-06-18 | Payer: MEDICARE

## 2019-06-17 DIAGNOSIS — N1832 Chronic kidney disease, stage 3b: Secondary | ICD-10-CM | POA: Diagnosis not present

## 2019-06-17 DIAGNOSIS — G4733 Obstructive sleep apnea (adult) (pediatric): Secondary | ICD-10-CM | POA: Diagnosis not present

## 2019-06-17 DIAGNOSIS — R3129 Other microscopic hematuria: Secondary | ICD-10-CM | POA: Diagnosis not present

## 2019-06-17 DIAGNOSIS — I10 Essential (primary) hypertension: Secondary | ICD-10-CM | POA: Diagnosis not present

## 2019-06-17 DIAGNOSIS — N183 Chronic kidney disease, stage 3 (moderate): Principal | ICD-10-CM

## 2019-06-24 ENCOUNTER — Encounter: Admit: 2019-06-24 | Discharge: 2019-06-25 | Payer: MEDICARE

## 2019-07-16 DIAGNOSIS — I10 Essential (primary) hypertension: Secondary | ICD-10-CM | POA: Diagnosis not present

## 2019-07-16 DIAGNOSIS — R69 Illness, unspecified: Secondary | ICD-10-CM | POA: Diagnosis not present

## 2019-07-23 ENCOUNTER — Encounter: Admit: 2019-07-23 | Discharge: 2019-07-24 | Payer: MEDICARE

## 2019-07-23 DIAGNOSIS — I878 Other specified disorders of veins: Secondary | ICD-10-CM | POA: Diagnosis not present

## 2019-07-23 DIAGNOSIS — R600 Localized edema: Secondary | ICD-10-CM | POA: Diagnosis not present

## 2019-07-24 MED ORDER — PEN NEEDLE, DIABETIC 32 GAUGE X 5/32" (4 MM)
Freq: Every day | 1 refills | 50 days | Status: CP
Start: 2019-07-24 — End: ?

## 2019-07-24 MED ORDER — LIRAGLUTIDE 0.6 MG/0.1 ML (18 MG/3 ML) SUBCUTANEOUS PEN INJECTOR
Freq: Every day | SUBCUTANEOUS | 1 refills | 30 days | Status: CP
Start: 2019-07-24 — End: 2019-09-22

## 2019-07-25 DIAGNOSIS — G4733 Obstructive sleep apnea (adult) (pediatric): Secondary | ICD-10-CM | POA: Diagnosis not present

## 2019-07-31 DIAGNOSIS — M109 Gout, unspecified: Secondary | ICD-10-CM | POA: Diagnosis not present

## 2019-07-31 DIAGNOSIS — M545 Low back pain: Secondary | ICD-10-CM | POA: Diagnosis not present

## 2019-07-31 DIAGNOSIS — G894 Chronic pain syndrome: Secondary | ICD-10-CM | POA: Diagnosis not present

## 2019-07-31 DIAGNOSIS — Z79891 Long term (current) use of opiate analgesic: Secondary | ICD-10-CM | POA: Diagnosis not present

## 2019-08-06 DIAGNOSIS — R69 Illness, unspecified: Secondary | ICD-10-CM | POA: Diagnosis not present

## 2019-08-06 DIAGNOSIS — I10 Essential (primary) hypertension: Secondary | ICD-10-CM | POA: Diagnosis not present

## 2019-08-15 DIAGNOSIS — Z23 Encounter for immunization: Secondary | ICD-10-CM | POA: Diagnosis not present

## 2019-08-22 ENCOUNTER — Ambulatory Visit: Payer: Medicare HMO | Admitting: Internal Medicine

## 2019-09-10 ENCOUNTER — Encounter: Admit: 2019-09-10 | Discharge: 2019-09-11 | Payer: MEDICARE

## 2019-09-10 DIAGNOSIS — R7309 Other abnormal glucose: Secondary | ICD-10-CM | POA: Diagnosis not present

## 2019-09-10 DIAGNOSIS — I10 Essential (primary) hypertension: Secondary | ICD-10-CM | POA: Diagnosis not present

## 2019-09-10 DIAGNOSIS — S81801D Unspecified open wound, right lower leg, subsequent encounter: Secondary | ICD-10-CM | POA: Diagnosis not present

## 2019-09-11 DIAGNOSIS — M109 Gout, unspecified: Secondary | ICD-10-CM | POA: Diagnosis not present

## 2019-09-11 DIAGNOSIS — M545 Low back pain: Secondary | ICD-10-CM | POA: Diagnosis not present

## 2019-09-11 DIAGNOSIS — Z79891 Long term (current) use of opiate analgesic: Secondary | ICD-10-CM | POA: Diagnosis not present

## 2019-09-11 DIAGNOSIS — Z23 Encounter for immunization: Secondary | ICD-10-CM | POA: Diagnosis not present

## 2019-09-11 DIAGNOSIS — G894 Chronic pain syndrome: Secondary | ICD-10-CM | POA: Diagnosis not present

## 2019-09-11 DIAGNOSIS — Z5181 Encounter for therapeutic drug level monitoring: Secondary | ICD-10-CM | POA: Diagnosis not present

## 2019-09-16 ENCOUNTER — Ambulatory Visit: Admit: 2019-09-16 | Discharge: 2019-09-16 | Payer: MEDICARE

## 2019-09-16 ENCOUNTER — Encounter: Admit: 2019-09-16 | Discharge: 2019-09-16 | Payer: MEDICARE

## 2019-09-16 DIAGNOSIS — G4733 Obstructive sleep apnea (adult) (pediatric): Secondary | ICD-10-CM | POA: Diagnosis not present

## 2019-09-16 DIAGNOSIS — I130 Hypertensive heart and chronic kidney disease with heart failure and stage 1 through stage 4 chronic kidney disease, or unspecified chronic kidney disease: Secondary | ICD-10-CM | POA: Diagnosis not present

## 2019-09-16 DIAGNOSIS — E669 Obesity, unspecified: Secondary | ICD-10-CM | POA: Diagnosis not present

## 2019-09-16 DIAGNOSIS — R0602 Shortness of breath: Secondary | ICD-10-CM | POA: Diagnosis not present

## 2019-09-16 DIAGNOSIS — Z6841 Body Mass Index (BMI) 40.0 and over, adult: Secondary | ICD-10-CM | POA: Diagnosis not present

## 2019-09-16 DIAGNOSIS — L89892 Pressure ulcer of other site, stage 2: Secondary | ICD-10-CM | POA: Diagnosis not present

## 2019-09-16 DIAGNOSIS — Z8679 Personal history of other diseases of the circulatory system: Secondary | ICD-10-CM | POA: Diagnosis not present

## 2019-09-16 DIAGNOSIS — I5030 Unspecified diastolic (congestive) heart failure: Secondary | ICD-10-CM | POA: Diagnosis not present

## 2019-09-16 DIAGNOSIS — I878 Other specified disorders of veins: Secondary | ICD-10-CM | POA: Diagnosis not present

## 2019-09-16 DIAGNOSIS — Z9989 Dependence on other enabling machines and devices: Secondary | ICD-10-CM | POA: Diagnosis not present

## 2019-09-16 DIAGNOSIS — I872 Venous insufficiency (chronic) (peripheral): Secondary | ICD-10-CM | POA: Diagnosis not present

## 2019-09-23 MED ORDER — BUMETANIDE 1 MG TABLET
ORAL_TABLET | Freq: Every day | ORAL | 1 refills | 90 days | Status: CP
Start: 2019-09-23 — End: 2020-03-21

## 2019-10-07 ENCOUNTER — Encounter: Admit: 2019-10-07 | Discharge: 2019-10-08 | Payer: MEDICARE

## 2019-10-07 DIAGNOSIS — I872 Venous insufficiency (chronic) (peripheral): Secondary | ICD-10-CM | POA: Diagnosis not present

## 2019-10-07 DIAGNOSIS — G4733 Obstructive sleep apnea (adult) (pediatric): Secondary | ICD-10-CM | POA: Diagnosis not present

## 2019-10-07 DIAGNOSIS — I96 Gangrene, not elsewhere classified: Secondary | ICD-10-CM | POA: Diagnosis not present

## 2019-10-07 DIAGNOSIS — S91102A Unspecified open wound of left great toe without damage to nail, initial encounter: Secondary | ICD-10-CM | POA: Diagnosis not present

## 2019-10-07 DIAGNOSIS — I129 Hypertensive chronic kidney disease with stage 1 through stage 4 chronic kidney disease, or unspecified chronic kidney disease: Secondary | ICD-10-CM | POA: Diagnosis not present

## 2019-10-07 DIAGNOSIS — S91101A Unspecified open wound of right great toe without damage to nail, initial encounter: Secondary | ICD-10-CM | POA: Diagnosis not present

## 2019-10-07 DIAGNOSIS — M310 Hypersensitivity angiitis: Secondary | ICD-10-CM | POA: Diagnosis not present

## 2019-10-07 DIAGNOSIS — S91101D Unspecified open wound of right great toe without damage to nail, subsequent encounter: Secondary | ICD-10-CM | POA: Diagnosis not present

## 2019-10-07 DIAGNOSIS — L98499 Non-pressure chronic ulcer of skin of other sites with unspecified severity: Secondary | ICD-10-CM | POA: Diagnosis not present

## 2019-10-07 DIAGNOSIS — N183 Chronic kidney disease, stage 3 unspecified: Secondary | ICD-10-CM | POA: Diagnosis not present

## 2019-10-07 DIAGNOSIS — X58XXXA Exposure to other specified factors, initial encounter: Secondary | ICD-10-CM | POA: Diagnosis not present

## 2019-10-10 ENCOUNTER — Ambulatory Visit: Admit: 2019-10-10 | Discharge: 2019-10-11 | Payer: MEDICARE

## 2019-10-10 DIAGNOSIS — R21 Rash and other nonspecific skin eruption: Secondary | ICD-10-CM | POA: Diagnosis not present

## 2019-10-10 DIAGNOSIS — Z114 Encounter for screening for human immunodeficiency virus [HIV]: Secondary | ICD-10-CM | POA: Diagnosis not present

## 2019-10-10 DIAGNOSIS — M310 Hypersensitivity angiitis: Secondary | ICD-10-CM | POA: Diagnosis not present

## 2019-10-10 DIAGNOSIS — R69 Illness, unspecified: Secondary | ICD-10-CM | POA: Diagnosis not present

## 2019-10-14 ENCOUNTER — Encounter: Admit: 2019-10-14 | Discharge: 2019-10-15 | Payer: MEDICARE

## 2019-10-14 DIAGNOSIS — I10 Essential (primary) hypertension: Secondary | ICD-10-CM | POA: Diagnosis not present

## 2019-10-14 DIAGNOSIS — M310 Hypersensitivity angiitis: Secondary | ICD-10-CM | POA: Diagnosis not present

## 2019-10-14 DIAGNOSIS — E669 Obesity, unspecified: Secondary | ICD-10-CM | POA: Diagnosis not present

## 2019-10-14 DIAGNOSIS — R21 Rash and other nonspecific skin eruption: Secondary | ICD-10-CM | POA: Diagnosis not present

## 2019-10-15 ENCOUNTER — Encounter: Admit: 2019-10-15 | Discharge: 2019-10-16 | Payer: MEDICARE

## 2019-10-15 DIAGNOSIS — R21 Rash and other nonspecific skin eruption: Secondary | ICD-10-CM | POA: Diagnosis not present

## 2019-10-15 DIAGNOSIS — R3129 Other microscopic hematuria: Secondary | ICD-10-CM | POA: Diagnosis not present

## 2019-10-15 DIAGNOSIS — I10 Essential (primary) hypertension: Secondary | ICD-10-CM | POA: Diagnosis not present

## 2019-10-15 DIAGNOSIS — N1832 Chronic kidney disease, stage 3b: Secondary | ICD-10-CM | POA: Diagnosis not present

## 2019-10-15 DIAGNOSIS — G4733 Obstructive sleep apnea (adult) (pediatric): Secondary | ICD-10-CM | POA: Diagnosis not present

## 2019-10-15 DIAGNOSIS — N183 Chronic kidney disease, stage 3 (moderate): Principal | ICD-10-CM

## 2019-10-22 DIAGNOSIS — S91101D Unspecified open wound of right great toe without damage to nail, subsequent encounter: Principal | ICD-10-CM

## 2019-10-22 MED ORDER — LIDOCAINE 4 % TOPICAL CREAM
Freq: Every day | TOPICAL | 1 refills | 0 days | Status: CP
Start: 2019-10-22 — End: 2020-10-21

## 2019-10-29 ENCOUNTER — Encounter: Admit: 2019-10-29 | Discharge: 2019-10-30 | Payer: MEDICARE | Attending: Dermatology | Primary: Dermatology

## 2019-10-29 DIAGNOSIS — M310 Hypersensitivity angiitis: Secondary | ICD-10-CM | POA: Diagnosis not present

## 2019-11-05 ENCOUNTER — Ambulatory Visit: Admit: 2019-11-05 | Discharge: 2019-11-06 | Payer: MEDICARE

## 2019-11-05 DIAGNOSIS — S91101D Unspecified open wound of right great toe without damage to nail, subsequent encounter: Secondary | ICD-10-CM | POA: Diagnosis not present

## 2019-11-12 DIAGNOSIS — Z79891 Long term (current) use of opiate analgesic: Secondary | ICD-10-CM | POA: Diagnosis not present

## 2019-11-12 DIAGNOSIS — Z79899 Other long term (current) drug therapy: Secondary | ICD-10-CM | POA: Diagnosis not present

## 2019-11-12 DIAGNOSIS — M545 Low back pain: Secondary | ICD-10-CM | POA: Diagnosis not present

## 2019-11-12 DIAGNOSIS — G894 Chronic pain syndrome: Secondary | ICD-10-CM | POA: Diagnosis not present

## 2019-11-12 DIAGNOSIS — M109 Gout, unspecified: Secondary | ICD-10-CM | POA: Diagnosis not present

## 2019-11-15 DIAGNOSIS — E669 Obesity, unspecified: Secondary | ICD-10-CM | POA: Diagnosis not present

## 2019-11-15 DIAGNOSIS — I10 Essential (primary) hypertension: Secondary | ICD-10-CM | POA: Diagnosis not present

## 2019-12-04 DIAGNOSIS — G4733 Obstructive sleep apnea (adult) (pediatric): Secondary | ICD-10-CM | POA: Diagnosis not present

## 2019-12-09 MED ORDER — HYDROCHLOROTHIAZIDE 12.5 MG TABLET
ORAL_TABLET | 3 refills | 0 days | Status: CP
Start: 2019-12-09 — End: ?

## 2019-12-17 ENCOUNTER — Ambulatory Visit: Admit: 2019-12-17 | Discharge: 2019-12-18 | Payer: MEDICARE

## 2019-12-17 DIAGNOSIS — B9689 Other specified bacterial agents as the cause of diseases classified elsewhere: Secondary | ICD-10-CM | POA: Diagnosis not present

## 2019-12-17 DIAGNOSIS — M310 Hypersensitivity angiitis: Secondary | ICD-10-CM | POA: Diagnosis not present

## 2019-12-17 DIAGNOSIS — L97221 Non-pressure chronic ulcer of left calf limited to breakdown of skin: Secondary | ICD-10-CM | POA: Diagnosis not present

## 2019-12-17 DIAGNOSIS — L089 Local infection of the skin and subcutaneous tissue, unspecified: Secondary | ICD-10-CM | POA: Diagnosis not present

## 2019-12-17 MED ORDER — TRIAMCINOLONE ACETONIDE 0.1 % TOPICAL OINTMENT
Freq: Two times a day (BID) | TOPICAL | 0 refills | 0 days | Status: CP
Start: 2019-12-17 — End: ?

## 2019-12-17 MED ORDER — DOXYCYCLINE HYCLATE 100 MG TABLET
ORAL_TABLET | Freq: Two times a day (BID) | ORAL | 0 refills | 5 days | Status: CP
Start: 2019-12-17 — End: 2019-12-22

## 2019-12-20 DIAGNOSIS — N183 Chronic kidney disease, stage 3 unspecified: Secondary | ICD-10-CM | POA: Diagnosis not present

## 2019-12-20 DIAGNOSIS — I10 Essential (primary) hypertension: Secondary | ICD-10-CM | POA: Diagnosis not present

## 2019-12-20 DIAGNOSIS — E669 Obesity, unspecified: Secondary | ICD-10-CM | POA: Diagnosis not present

## 2019-12-20 DIAGNOSIS — R7309 Other abnormal glucose: Secondary | ICD-10-CM | POA: Diagnosis not present

## 2019-12-23 DIAGNOSIS — G894 Chronic pain syndrome: Secondary | ICD-10-CM | POA: Diagnosis not present

## 2019-12-23 DIAGNOSIS — E669 Obesity, unspecified: Secondary | ICD-10-CM | POA: Diagnosis not present

## 2019-12-23 DIAGNOSIS — R69 Illness, unspecified: Secondary | ICD-10-CM | POA: Diagnosis not present

## 2019-12-23 DIAGNOSIS — I10 Essential (primary) hypertension: Secondary | ICD-10-CM | POA: Diagnosis not present

## 2019-12-24 ENCOUNTER — Encounter
Admit: 2019-12-24 | Discharge: 2019-12-25 | Payer: MEDICARE | Attending: Physician Assistant | Primary: Physician Assistant

## 2019-12-24 DIAGNOSIS — Z9989 Dependence on other enabling machines and devices: Secondary | ICD-10-CM | POA: Diagnosis not present

## 2019-12-24 DIAGNOSIS — R0601 Orthopnea: Secondary | ICD-10-CM | POA: Diagnosis not present

## 2019-12-24 DIAGNOSIS — G4719 Other hypersomnia: Secondary | ICD-10-CM | POA: Diagnosis not present

## 2019-12-24 DIAGNOSIS — R351 Nocturia: Secondary | ICD-10-CM | POA: Diagnosis not present

## 2019-12-24 DIAGNOSIS — G4733 Obstructive sleep apnea (adult) (pediatric): Secondary | ICD-10-CM | POA: Diagnosis not present

## 2019-12-24 DIAGNOSIS — G478 Other sleep disorders: Secondary | ICD-10-CM | POA: Diagnosis not present

## 2020-01-14 DIAGNOSIS — M5416 Radiculopathy, lumbar region: Secondary | ICD-10-CM | POA: Diagnosis not present

## 2020-01-14 DIAGNOSIS — Z79891 Long term (current) use of opiate analgesic: Secondary | ICD-10-CM | POA: Diagnosis not present

## 2020-01-14 DIAGNOSIS — M62838 Other muscle spasm: Secondary | ICD-10-CM | POA: Diagnosis not present

## 2020-01-14 DIAGNOSIS — M545 Low back pain: Secondary | ICD-10-CM | POA: Diagnosis not present

## 2020-01-14 DIAGNOSIS — G894 Chronic pain syndrome: Secondary | ICD-10-CM | POA: Diagnosis not present

## 2020-01-14 DIAGNOSIS — M25569 Pain in unspecified knee: Secondary | ICD-10-CM | POA: Diagnosis not present

## 2020-01-20 DIAGNOSIS — R69 Illness, unspecified: Secondary | ICD-10-CM | POA: Diagnosis not present

## 2020-01-20 DIAGNOSIS — Z0000 Encounter for general adult medical examination without abnormal findings: Secondary | ICD-10-CM | POA: Diagnosis not present

## 2020-01-20 DIAGNOSIS — Z1389 Encounter for screening for other disorder: Secondary | ICD-10-CM | POA: Diagnosis not present

## 2020-01-20 DIAGNOSIS — G894 Chronic pain syndrome: Secondary | ICD-10-CM | POA: Diagnosis not present

## 2020-01-23 ENCOUNTER — Ambulatory Visit: Admit: 2020-01-23 | Discharge: 2020-01-24 | Payer: MEDICARE

## 2020-01-23 DIAGNOSIS — R609 Edema, unspecified: Secondary | ICD-10-CM | POA: Diagnosis not present

## 2020-02-04 DIAGNOSIS — Z79899 Other long term (current) drug therapy: Secondary | ICD-10-CM | POA: Diagnosis not present

## 2020-02-04 DIAGNOSIS — Z79891 Long term (current) use of opiate analgesic: Secondary | ICD-10-CM | POA: Diagnosis not present

## 2020-02-06 ENCOUNTER — Ambulatory Visit: Admit: 2020-02-06 | Discharge: 2020-02-08 | Payer: MEDICARE

## 2020-02-06 DIAGNOSIS — G4719 Other hypersomnia: Secondary | ICD-10-CM | POA: Diagnosis not present

## 2020-02-06 DIAGNOSIS — R351 Nocturia: Secondary | ICD-10-CM | POA: Diagnosis not present

## 2020-02-06 DIAGNOSIS — Z66 Do not resuscitate: Secondary | ICD-10-CM | POA: Diagnosis not present

## 2020-02-06 DIAGNOSIS — Z9989 Dependence on other enabling machines and devices: Secondary | ICD-10-CM | POA: Diagnosis not present

## 2020-02-06 DIAGNOSIS — G478 Other sleep disorders: Secondary | ICD-10-CM | POA: Diagnosis not present

## 2020-02-06 DIAGNOSIS — G4733 Obstructive sleep apnea (adult) (pediatric): Secondary | ICD-10-CM | POA: Diagnosis not present

## 2020-02-11 DIAGNOSIS — G4733 Obstructive sleep apnea (adult) (pediatric): Principal | ICD-10-CM

## 2020-02-11 DIAGNOSIS — E662 Morbid (severe) obesity with alveolar hypoventilation: Principal | ICD-10-CM

## 2020-02-18 DIAGNOSIS — M545 Low back pain: Secondary | ICD-10-CM | POA: Diagnosis not present

## 2020-02-18 DIAGNOSIS — Z79891 Long term (current) use of opiate analgesic: Secondary | ICD-10-CM | POA: Diagnosis not present

## 2020-02-18 DIAGNOSIS — G894 Chronic pain syndrome: Secondary | ICD-10-CM | POA: Diagnosis not present

## 2020-02-18 DIAGNOSIS — M25569 Pain in unspecified knee: Secondary | ICD-10-CM | POA: Diagnosis not present

## 2020-02-18 DIAGNOSIS — M62838 Other muscle spasm: Secondary | ICD-10-CM | POA: Diagnosis not present

## 2020-02-18 DIAGNOSIS — M5416 Radiculopathy, lumbar region: Secondary | ICD-10-CM | POA: Diagnosis not present

## 2020-02-24 DIAGNOSIS — I10 Essential (primary) hypertension: Secondary | ICD-10-CM | POA: Diagnosis not present

## 2020-02-24 DIAGNOSIS — G894 Chronic pain syndrome: Secondary | ICD-10-CM | POA: Diagnosis not present

## 2020-03-16 ENCOUNTER — Telehealth
Admit: 2020-03-16 | Discharge: 2020-03-17 | Payer: MEDICARE | Attending: Physician Assistant | Primary: Physician Assistant

## 2020-03-16 DIAGNOSIS — Z9989 Dependence on other enabling machines and devices: Secondary | ICD-10-CM | POA: Diagnosis not present

## 2020-03-16 DIAGNOSIS — G4733 Obstructive sleep apnea (adult) (pediatric): Secondary | ICD-10-CM | POA: Diagnosis not present

## 2020-03-16 DIAGNOSIS — G4719 Other hypersomnia: Secondary | ICD-10-CM | POA: Diagnosis not present

## 2020-03-17 DIAGNOSIS — M545 Low back pain: Secondary | ICD-10-CM | POA: Diagnosis not present

## 2020-03-17 DIAGNOSIS — M25569 Pain in unspecified knee: Secondary | ICD-10-CM | POA: Diagnosis not present

## 2020-03-17 DIAGNOSIS — M62838 Other muscle spasm: Secondary | ICD-10-CM | POA: Diagnosis not present

## 2020-03-17 DIAGNOSIS — R69 Illness, unspecified: Secondary | ICD-10-CM | POA: Diagnosis not present

## 2020-03-17 DIAGNOSIS — G894 Chronic pain syndrome: Secondary | ICD-10-CM | POA: Diagnosis not present

## 2020-03-17 DIAGNOSIS — Z79891 Long term (current) use of opiate analgesic: Secondary | ICD-10-CM | POA: Diagnosis not present

## 2020-03-17 DIAGNOSIS — M5416 Radiculopathy, lumbar region: Secondary | ICD-10-CM | POA: Diagnosis not present

## 2020-03-20 ENCOUNTER — Encounter
Admit: 2020-03-20 | Discharge: 2020-04-09 | Disposition: A | Discharge status: Home Health | Payer: MEDICARE | Admitting: Internal Medicine

## 2020-03-20 ENCOUNTER — Ambulatory Visit
Admit: 2020-03-20 | Discharge: 2020-04-09 | Disposition: A | Discharge status: Home Health | Payer: MEDICARE | Admitting: Internal Medicine

## 2020-03-20 DIAGNOSIS — Z6841 Body Mass Index (BMI) 40.0 and over, adult: Secondary | ICD-10-CM | POA: Diagnosis not present

## 2020-03-20 DIAGNOSIS — R4182 Altered mental status, unspecified: Secondary | ICD-10-CM | POA: Diagnosis not present

## 2020-03-20 DIAGNOSIS — M1712 Unilateral primary osteoarthritis, left knee: Secondary | ICD-10-CM | POA: Diagnosis not present

## 2020-03-20 DIAGNOSIS — J9601 Acute respiratory failure with hypoxia: Secondary | ICD-10-CM | POA: Diagnosis not present

## 2020-03-20 DIAGNOSIS — I872 Venous insufficiency (chronic) (peripheral): Secondary | ICD-10-CM | POA: Diagnosis not present

## 2020-03-20 DIAGNOSIS — R5381 Other malaise: Secondary | ICD-10-CM | POA: Diagnosis not present

## 2020-03-20 DIAGNOSIS — E875 Hyperkalemia: Secondary | ICD-10-CM | POA: Diagnosis not present

## 2020-03-20 DIAGNOSIS — R918 Other nonspecific abnormal finding of lung field: Secondary | ICD-10-CM | POA: Diagnosis not present

## 2020-03-20 DIAGNOSIS — Z7409 Other reduced mobility: Secondary | ICD-10-CM | POA: Diagnosis not present

## 2020-03-20 DIAGNOSIS — N179 Acute kidney failure, unspecified: Secondary | ICD-10-CM | POA: Diagnosis not present

## 2020-03-20 DIAGNOSIS — D72829 Elevated white blood cell count, unspecified: Secondary | ICD-10-CM | POA: Diagnosis not present

## 2020-03-20 DIAGNOSIS — K219 Gastro-esophageal reflux disease without esophagitis: Secondary | ICD-10-CM | POA: Diagnosis not present

## 2020-03-20 DIAGNOSIS — M4307 Spondylolysis, lumbosacral region: Secondary | ICD-10-CM | POA: Diagnosis not present

## 2020-03-20 DIAGNOSIS — N401 Enlarged prostate with lower urinary tract symptoms: Secondary | ICD-10-CM | POA: Diagnosis not present

## 2020-03-20 DIAGNOSIS — I5032 Chronic diastolic (congestive) heart failure: Secondary | ICD-10-CM | POA: Diagnosis not present

## 2020-03-20 DIAGNOSIS — R29898 Other symptoms and signs involving the musculoskeletal system: Secondary | ICD-10-CM | POA: Diagnosis not present

## 2020-03-20 DIAGNOSIS — N189 Chronic kidney disease, unspecified: Secondary | ICD-10-CM | POA: Diagnosis not present

## 2020-03-20 DIAGNOSIS — M1180 Other specified crystal arthropathies, unspecified site: Secondary | ICD-10-CM | POA: Diagnosis not present

## 2020-03-20 DIAGNOSIS — N400 Enlarged prostate without lower urinary tract symptoms: Secondary | ICD-10-CM | POA: Diagnosis not present

## 2020-03-20 DIAGNOSIS — Z9989 Dependence on other enabling machines and devices: Secondary | ICD-10-CM | POA: Diagnosis not present

## 2020-03-20 DIAGNOSIS — M25562 Pain in left knee: Secondary | ICD-10-CM | POA: Diagnosis not present

## 2020-03-20 DIAGNOSIS — R0689 Other abnormalities of breathing: Secondary | ICD-10-CM | POA: Diagnosis not present

## 2020-03-20 DIAGNOSIS — I129 Hypertensive chronic kidney disease with stage 1 through stage 4 chronic kidney disease, or unspecified chronic kidney disease: Secondary | ICD-10-CM | POA: Diagnosis not present

## 2020-03-20 DIAGNOSIS — E874 Mixed disorder of acid-base balance: Secondary | ICD-10-CM | POA: Diagnosis not present

## 2020-03-20 DIAGNOSIS — J9602 Acute respiratory failure with hypercapnia: Secondary | ICD-10-CM | POA: Diagnosis not present

## 2020-03-20 DIAGNOSIS — Z79899 Other long term (current) drug therapy: Secondary | ICD-10-CM | POA: Diagnosis not present

## 2020-03-20 DIAGNOSIS — M47817 Spondylosis without myelopathy or radiculopathy, lumbosacral region: Secondary | ICD-10-CM | POA: Diagnosis not present

## 2020-03-20 DIAGNOSIS — R569 Unspecified convulsions: Secondary | ICD-10-CM | POA: Diagnosis not present

## 2020-03-20 DIAGNOSIS — G8929 Other chronic pain: Secondary | ICD-10-CM | POA: Diagnosis not present

## 2020-03-20 DIAGNOSIS — G9340 Encephalopathy, unspecified: Secondary | ICD-10-CM | POA: Diagnosis not present

## 2020-03-20 DIAGNOSIS — I5030 Unspecified diastolic (congestive) heart failure: Secondary | ICD-10-CM | POA: Diagnosis not present

## 2020-03-20 DIAGNOSIS — M11262 Other chondrocalcinosis, left knee: Secondary | ICD-10-CM | POA: Diagnosis not present

## 2020-03-20 DIAGNOSIS — E870 Hyperosmolality and hypernatremia: Secondary | ICD-10-CM | POA: Diagnosis not present

## 2020-03-20 DIAGNOSIS — N183 Chronic kidney disease, stage 3 unspecified: Secondary | ICD-10-CM | POA: Diagnosis not present

## 2020-03-20 DIAGNOSIS — R509 Fever, unspecified: Secondary | ICD-10-CM | POA: Diagnosis not present

## 2020-03-20 DIAGNOSIS — M109 Gout, unspecified: Secondary | ICD-10-CM | POA: Diagnosis not present

## 2020-03-20 DIAGNOSIS — R319 Hematuria, unspecified: Secondary | ICD-10-CM | POA: Diagnosis not present

## 2020-03-20 DIAGNOSIS — I110 Hypertensive heart disease with heart failure: Secondary | ICD-10-CM | POA: Diagnosis not present

## 2020-03-20 DIAGNOSIS — I130 Hypertensive heart and chronic kidney disease with heart failure and stage 1 through stage 4 chronic kidney disease, or unspecified chronic kidney disease: Secondary | ICD-10-CM | POA: Diagnosis not present

## 2020-03-20 DIAGNOSIS — M10062 Idiopathic gout, left knee: Secondary | ICD-10-CM | POA: Diagnosis not present

## 2020-03-20 DIAGNOSIS — R0600 Dyspnea, unspecified: Secondary | ICD-10-CM | POA: Diagnosis not present

## 2020-03-20 DIAGNOSIS — N170 Acute kidney failure with tubular necrosis: Secondary | ICD-10-CM | POA: Diagnosis not present

## 2020-03-20 DIAGNOSIS — G4733 Obstructive sleep apnea (adult) (pediatric): Secondary | ICD-10-CM | POA: Diagnosis not present

## 2020-03-20 DIAGNOSIS — G9341 Metabolic encephalopathy: Secondary | ICD-10-CM | POA: Diagnosis not present

## 2020-03-20 DIAGNOSIS — R531 Weakness: Secondary | ICD-10-CM | POA: Diagnosis not present

## 2020-03-20 DIAGNOSIS — R939 Diagnostic imaging inconclusive due to excess body fat of patient: Secondary | ICD-10-CM | POA: Diagnosis not present

## 2020-03-20 DIAGNOSIS — M549 Dorsalgia, unspecified: Secondary | ICD-10-CM | POA: Diagnosis not present

## 2020-03-20 DIAGNOSIS — I10 Essential (primary) hypertension: Secondary | ICD-10-CM | POA: Diagnosis not present

## 2020-03-20 DIAGNOSIS — G253 Myoclonus: Secondary | ICD-10-CM | POA: Diagnosis not present

## 2020-03-20 DIAGNOSIS — Z20822 Contact with and (suspected) exposure to covid-19: Secondary | ICD-10-CM | POA: Diagnosis not present

## 2020-03-20 DIAGNOSIS — I120 Hypertensive chronic kidney disease with stage 5 chronic kidney disease or end stage renal disease: Secondary | ICD-10-CM | POA: Diagnosis not present

## 2020-03-20 DIAGNOSIS — R0602 Shortness of breath: Principal | ICD-10-CM

## 2020-03-20 MED ORDER — BUMETANIDE 1 MG TABLET
ORAL_TABLET | Freq: Every day | ORAL | 6 refills | 90.00000 days | Status: CP
Start: 2020-03-20 — End: 2020-09-16

## 2020-03-21 DIAGNOSIS — R918 Other nonspecific abnormal finding of lung field: Secondary | ICD-10-CM | POA: Diagnosis not present

## 2020-03-21 DIAGNOSIS — R0600 Dyspnea, unspecified: Secondary | ICD-10-CM | POA: Diagnosis not present

## 2020-03-21 DIAGNOSIS — I5030 Unspecified diastolic (congestive) heart failure: Secondary | ICD-10-CM | POA: Diagnosis not present

## 2020-03-21 DIAGNOSIS — K219 Gastro-esophageal reflux disease without esophagitis: Secondary | ICD-10-CM | POA: Diagnosis not present

## 2020-03-21 DIAGNOSIS — N179 Acute kidney failure, unspecified: Secondary | ICD-10-CM | POA: Diagnosis not present

## 2020-03-21 DIAGNOSIS — R0689 Other abnormalities of breathing: Secondary | ICD-10-CM | POA: Diagnosis not present

## 2020-03-21 DIAGNOSIS — Z79899 Other long term (current) drug therapy: Secondary | ICD-10-CM | POA: Diagnosis not present

## 2020-03-21 DIAGNOSIS — R939 Diagnostic imaging inconclusive due to excess body fat of patient: Secondary | ICD-10-CM | POA: Diagnosis not present

## 2020-03-21 DIAGNOSIS — N400 Enlarged prostate without lower urinary tract symptoms: Secondary | ICD-10-CM | POA: Diagnosis not present

## 2020-03-21 DIAGNOSIS — I130 Hypertensive heart and chronic kidney disease with heart failure and stage 1 through stage 4 chronic kidney disease, or unspecified chronic kidney disease: Secondary | ICD-10-CM | POA: Diagnosis not present

## 2020-03-21 DIAGNOSIS — J9601 Acute respiratory failure with hypoxia: Secondary | ICD-10-CM | POA: Diagnosis not present

## 2020-03-21 DIAGNOSIS — R4182 Altered mental status, unspecified: Secondary | ICD-10-CM | POA: Diagnosis not present

## 2020-03-21 DIAGNOSIS — R319 Hematuria, unspecified: Secondary | ICD-10-CM | POA: Diagnosis not present

## 2020-03-21 DIAGNOSIS — N189 Chronic kidney disease, unspecified: Secondary | ICD-10-CM | POA: Diagnosis not present

## 2020-03-21 DIAGNOSIS — I129 Hypertensive chronic kidney disease with stage 1 through stage 4 chronic kidney disease, or unspecified chronic kidney disease: Secondary | ICD-10-CM | POA: Diagnosis not present

## 2020-03-22 DIAGNOSIS — R4182 Altered mental status, unspecified: Secondary | ICD-10-CM | POA: Diagnosis not present

## 2020-03-22 DIAGNOSIS — G253 Myoclonus: Secondary | ICD-10-CM | POA: Diagnosis not present

## 2020-03-22 DIAGNOSIS — N179 Acute kidney failure, unspecified: Secondary | ICD-10-CM | POA: Diagnosis not present

## 2020-03-22 DIAGNOSIS — G9340 Encephalopathy, unspecified: Secondary | ICD-10-CM | POA: Diagnosis not present

## 2020-03-23 DIAGNOSIS — I130 Hypertensive heart and chronic kidney disease with heart failure and stage 1 through stage 4 chronic kidney disease, or unspecified chronic kidney disease: Secondary | ICD-10-CM | POA: Diagnosis not present

## 2020-03-23 DIAGNOSIS — R4182 Altered mental status, unspecified: Secondary | ICD-10-CM | POA: Diagnosis not present

## 2020-03-23 DIAGNOSIS — N179 Acute kidney failure, unspecified: Secondary | ICD-10-CM | POA: Diagnosis not present

## 2020-03-23 DIAGNOSIS — G253 Myoclonus: Secondary | ICD-10-CM | POA: Diagnosis not present

## 2020-03-23 DIAGNOSIS — N189 Chronic kidney disease, unspecified: Secondary | ICD-10-CM | POA: Diagnosis not present

## 2020-03-23 DIAGNOSIS — R569 Unspecified convulsions: Secondary | ICD-10-CM | POA: Diagnosis not present

## 2020-03-23 DIAGNOSIS — I5030 Unspecified diastolic (congestive) heart failure: Secondary | ICD-10-CM | POA: Diagnosis not present

## 2020-03-23 DIAGNOSIS — G9340 Encephalopathy, unspecified: Secondary | ICD-10-CM | POA: Diagnosis not present

## 2020-03-24 DIAGNOSIS — R4182 Altered mental status, unspecified: Secondary | ICD-10-CM | POA: Diagnosis not present

## 2020-03-24 DIAGNOSIS — N189 Chronic kidney disease, unspecified: Secondary | ICD-10-CM | POA: Diagnosis not present

## 2020-03-24 DIAGNOSIS — N179 Acute kidney failure, unspecified: Secondary | ICD-10-CM | POA: Diagnosis not present

## 2020-03-24 DIAGNOSIS — I5030 Unspecified diastolic (congestive) heart failure: Secondary | ICD-10-CM | POA: Diagnosis not present

## 2020-03-24 DIAGNOSIS — G253 Myoclonus: Secondary | ICD-10-CM | POA: Diagnosis not present

## 2020-03-24 DIAGNOSIS — I120 Hypertensive chronic kidney disease with stage 5 chronic kidney disease or end stage renal disease: Secondary | ICD-10-CM | POA: Diagnosis not present

## 2020-03-24 DIAGNOSIS — G9341 Metabolic encephalopathy: Secondary | ICD-10-CM | POA: Diagnosis not present

## 2020-03-25 DIAGNOSIS — I5030 Unspecified diastolic (congestive) heart failure: Secondary | ICD-10-CM | POA: Diagnosis not present

## 2020-03-25 DIAGNOSIS — N179 Acute kidney failure, unspecified: Secondary | ICD-10-CM | POA: Diagnosis not present

## 2020-03-25 DIAGNOSIS — I130 Hypertensive heart and chronic kidney disease with heart failure and stage 1 through stage 4 chronic kidney disease, or unspecified chronic kidney disease: Secondary | ICD-10-CM | POA: Diagnosis not present

## 2020-03-25 DIAGNOSIS — R4182 Altered mental status, unspecified: Secondary | ICD-10-CM | POA: Diagnosis not present

## 2020-03-25 DIAGNOSIS — N189 Chronic kidney disease, unspecified: Secondary | ICD-10-CM | POA: Diagnosis not present

## 2020-03-25 DIAGNOSIS — R509 Fever, unspecified: Secondary | ICD-10-CM | POA: Diagnosis not present

## 2020-03-26 DIAGNOSIS — N179 Acute kidney failure, unspecified: Secondary | ICD-10-CM | POA: Diagnosis not present

## 2020-03-26 DIAGNOSIS — I130 Hypertensive heart and chronic kidney disease with heart failure and stage 1 through stage 4 chronic kidney disease, or unspecified chronic kidney disease: Secondary | ICD-10-CM | POA: Diagnosis not present

## 2020-03-26 DIAGNOSIS — N189 Chronic kidney disease, unspecified: Secondary | ICD-10-CM | POA: Diagnosis not present

## 2020-03-26 DIAGNOSIS — I5030 Unspecified diastolic (congestive) heart failure: Secondary | ICD-10-CM | POA: Diagnosis not present

## 2020-03-26 DIAGNOSIS — R4182 Altered mental status, unspecified: Secondary | ICD-10-CM | POA: Diagnosis not present

## 2020-03-27 DIAGNOSIS — R4182 Altered mental status, unspecified: Secondary | ICD-10-CM | POA: Diagnosis not present

## 2020-03-27 DIAGNOSIS — I130 Hypertensive heart and chronic kidney disease with heart failure and stage 1 through stage 4 chronic kidney disease, or unspecified chronic kidney disease: Secondary | ICD-10-CM | POA: Diagnosis not present

## 2020-03-27 DIAGNOSIS — I5030 Unspecified diastolic (congestive) heart failure: Secondary | ICD-10-CM | POA: Diagnosis not present

## 2020-03-27 DIAGNOSIS — N179 Acute kidney failure, unspecified: Secondary | ICD-10-CM | POA: Diagnosis not present

## 2020-03-27 DIAGNOSIS — N189 Chronic kidney disease, unspecified: Secondary | ICD-10-CM | POA: Diagnosis not present

## 2020-03-28 DIAGNOSIS — I130 Hypertensive heart and chronic kidney disease with heart failure and stage 1 through stage 4 chronic kidney disease, or unspecified chronic kidney disease: Secondary | ICD-10-CM | POA: Diagnosis not present

## 2020-03-28 DIAGNOSIS — I5030 Unspecified diastolic (congestive) heart failure: Secondary | ICD-10-CM | POA: Diagnosis not present

## 2020-03-28 DIAGNOSIS — N179 Acute kidney failure, unspecified: Secondary | ICD-10-CM | POA: Diagnosis not present

## 2020-03-28 DIAGNOSIS — N189 Chronic kidney disease, unspecified: Secondary | ICD-10-CM | POA: Diagnosis not present

## 2020-03-29 DIAGNOSIS — I110 Hypertensive heart disease with heart failure: Secondary | ICD-10-CM | POA: Diagnosis not present

## 2020-03-29 DIAGNOSIS — N179 Acute kidney failure, unspecified: Secondary | ICD-10-CM | POA: Diagnosis not present

## 2020-03-29 DIAGNOSIS — I5030 Unspecified diastolic (congestive) heart failure: Secondary | ICD-10-CM | POA: Diagnosis not present

## 2020-03-29 DIAGNOSIS — R4182 Altered mental status, unspecified: Secondary | ICD-10-CM | POA: Diagnosis not present

## 2020-03-30 DIAGNOSIS — N179 Acute kidney failure, unspecified: Secondary | ICD-10-CM | POA: Diagnosis not present

## 2020-03-30 DIAGNOSIS — G4733 Obstructive sleep apnea (adult) (pediatric): Secondary | ICD-10-CM | POA: Diagnosis not present

## 2020-03-30 DIAGNOSIS — E875 Hyperkalemia: Secondary | ICD-10-CM | POA: Diagnosis not present

## 2020-03-30 DIAGNOSIS — M4307 Spondylolysis, lumbosacral region: Secondary | ICD-10-CM | POA: Diagnosis not present

## 2020-03-30 DIAGNOSIS — R4182 Altered mental status, unspecified: Secondary | ICD-10-CM | POA: Diagnosis not present

## 2020-03-30 DIAGNOSIS — J9601 Acute respiratory failure with hypoxia: Secondary | ICD-10-CM | POA: Diagnosis not present

## 2020-03-30 DIAGNOSIS — I5030 Unspecified diastolic (congestive) heart failure: Secondary | ICD-10-CM | POA: Diagnosis not present

## 2020-03-30 DIAGNOSIS — J9602 Acute respiratory failure with hypercapnia: Secondary | ICD-10-CM | POA: Diagnosis not present

## 2020-03-30 DIAGNOSIS — I130 Hypertensive heart and chronic kidney disease with heart failure and stage 1 through stage 4 chronic kidney disease, or unspecified chronic kidney disease: Secondary | ICD-10-CM | POA: Diagnosis not present

## 2020-03-30 DIAGNOSIS — N183 Chronic kidney disease, stage 3 unspecified: Secondary | ICD-10-CM | POA: Diagnosis not present

## 2020-03-31 DIAGNOSIS — I130 Hypertensive heart and chronic kidney disease with heart failure and stage 1 through stage 4 chronic kidney disease, or unspecified chronic kidney disease: Secondary | ICD-10-CM | POA: Diagnosis not present

## 2020-03-31 DIAGNOSIS — D72829 Elevated white blood cell count, unspecified: Secondary | ICD-10-CM | POA: Diagnosis not present

## 2020-03-31 DIAGNOSIS — R4182 Altered mental status, unspecified: Secondary | ICD-10-CM | POA: Diagnosis not present

## 2020-03-31 DIAGNOSIS — N183 Chronic kidney disease, stage 3 unspecified: Secondary | ICD-10-CM | POA: Diagnosis not present

## 2020-03-31 DIAGNOSIS — I5030 Unspecified diastolic (congestive) heart failure: Secondary | ICD-10-CM | POA: Diagnosis not present

## 2020-03-31 DIAGNOSIS — M25562 Pain in left knee: Secondary | ICD-10-CM | POA: Diagnosis not present

## 2020-03-31 DIAGNOSIS — M109 Gout, unspecified: Secondary | ICD-10-CM | POA: Diagnosis not present

## 2020-03-31 DIAGNOSIS — N179 Acute kidney failure, unspecified: Secondary | ICD-10-CM | POA: Diagnosis not present

## 2020-03-31 DIAGNOSIS — M549 Dorsalgia, unspecified: Secondary | ICD-10-CM | POA: Diagnosis not present

## 2020-03-31 DIAGNOSIS — M4307 Spondylolysis, lumbosacral region: Secondary | ICD-10-CM | POA: Diagnosis not present

## 2020-03-31 DIAGNOSIS — E875 Hyperkalemia: Secondary | ICD-10-CM | POA: Diagnosis not present

## 2020-04-01 DIAGNOSIS — N183 Chronic kidney disease, stage 3 unspecified: Secondary | ICD-10-CM | POA: Diagnosis not present

## 2020-04-01 DIAGNOSIS — E875 Hyperkalemia: Secondary | ICD-10-CM | POA: Diagnosis not present

## 2020-04-01 DIAGNOSIS — I5030 Unspecified diastolic (congestive) heart failure: Secondary | ICD-10-CM | POA: Diagnosis not present

## 2020-04-01 DIAGNOSIS — I130 Hypertensive heart and chronic kidney disease with heart failure and stage 1 through stage 4 chronic kidney disease, or unspecified chronic kidney disease: Secondary | ICD-10-CM | POA: Diagnosis not present

## 2020-04-01 DIAGNOSIS — R4182 Altered mental status, unspecified: Secondary | ICD-10-CM | POA: Diagnosis not present

## 2020-04-01 DIAGNOSIS — D72829 Elevated white blood cell count, unspecified: Secondary | ICD-10-CM | POA: Diagnosis not present

## 2020-04-01 DIAGNOSIS — M1712 Unilateral primary osteoarthritis, left knee: Secondary | ICD-10-CM | POA: Diagnosis not present

## 2020-04-01 DIAGNOSIS — N179 Acute kidney failure, unspecified: Secondary | ICD-10-CM | POA: Diagnosis not present

## 2020-04-01 DIAGNOSIS — M4307 Spondylolysis, lumbosacral region: Secondary | ICD-10-CM | POA: Diagnosis not present

## 2020-04-01 DIAGNOSIS — M549 Dorsalgia, unspecified: Secondary | ICD-10-CM | POA: Diagnosis not present

## 2020-04-01 DIAGNOSIS — M25562 Pain in left knee: Secondary | ICD-10-CM | POA: Diagnosis not present

## 2020-04-02 DIAGNOSIS — I5030 Unspecified diastolic (congestive) heart failure: Secondary | ICD-10-CM | POA: Diagnosis not present

## 2020-04-02 DIAGNOSIS — M11262 Other chondrocalcinosis, left knee: Secondary | ICD-10-CM | POA: Diagnosis not present

## 2020-04-02 DIAGNOSIS — G8929 Other chronic pain: Secondary | ICD-10-CM | POA: Diagnosis not present

## 2020-04-02 DIAGNOSIS — G4733 Obstructive sleep apnea (adult) (pediatric): Secondary | ICD-10-CM | POA: Diagnosis not present

## 2020-04-02 DIAGNOSIS — N183 Chronic kidney disease, stage 3 unspecified: Secondary | ICD-10-CM | POA: Diagnosis not present

## 2020-04-02 DIAGNOSIS — R29898 Other symptoms and signs involving the musculoskeletal system: Secondary | ICD-10-CM | POA: Diagnosis not present

## 2020-04-02 DIAGNOSIS — M549 Dorsalgia, unspecified: Secondary | ICD-10-CM | POA: Diagnosis not present

## 2020-04-02 DIAGNOSIS — I130 Hypertensive heart and chronic kidney disease with heart failure and stage 1 through stage 4 chronic kidney disease, or unspecified chronic kidney disease: Secondary | ICD-10-CM | POA: Diagnosis not present

## 2020-04-02 DIAGNOSIS — M10062 Idiopathic gout, left knee: Secondary | ICD-10-CM | POA: Diagnosis not present

## 2020-04-02 DIAGNOSIS — M1180 Other specified crystal arthropathies, unspecified site: Secondary | ICD-10-CM | POA: Diagnosis not present

## 2020-04-02 DIAGNOSIS — N179 Acute kidney failure, unspecified: Secondary | ICD-10-CM | POA: Diagnosis not present

## 2020-04-03 DIAGNOSIS — N179 Acute kidney failure, unspecified: Secondary | ICD-10-CM | POA: Diagnosis not present

## 2020-04-03 DIAGNOSIS — N183 Chronic kidney disease, stage 3 unspecified: Secondary | ICD-10-CM | POA: Diagnosis not present

## 2020-04-03 DIAGNOSIS — I5030 Unspecified diastolic (congestive) heart failure: Secondary | ICD-10-CM | POA: Diagnosis not present

## 2020-04-03 DIAGNOSIS — R29898 Other symptoms and signs involving the musculoskeletal system: Secondary | ICD-10-CM | POA: Diagnosis not present

## 2020-04-03 DIAGNOSIS — M549 Dorsalgia, unspecified: Secondary | ICD-10-CM | POA: Diagnosis not present

## 2020-04-03 DIAGNOSIS — M4307 Spondylolysis, lumbosacral region: Secondary | ICD-10-CM | POA: Diagnosis not present

## 2020-04-03 DIAGNOSIS — I130 Hypertensive heart and chronic kidney disease with heart failure and stage 1 through stage 4 chronic kidney disease, or unspecified chronic kidney disease: Secondary | ICD-10-CM | POA: Diagnosis not present

## 2020-04-03 DIAGNOSIS — G8929 Other chronic pain: Secondary | ICD-10-CM | POA: Diagnosis not present

## 2020-04-03 DIAGNOSIS — M11262 Other chondrocalcinosis, left knee: Secondary | ICD-10-CM | POA: Diagnosis not present

## 2020-04-04 DIAGNOSIS — M4307 Spondylolysis, lumbosacral region: Secondary | ICD-10-CM | POA: Diagnosis not present

## 2020-04-04 DIAGNOSIS — N179 Acute kidney failure, unspecified: Secondary | ICD-10-CM | POA: Diagnosis not present

## 2020-04-04 DIAGNOSIS — G8929 Other chronic pain: Secondary | ICD-10-CM | POA: Diagnosis not present

## 2020-04-04 DIAGNOSIS — M549 Dorsalgia, unspecified: Secondary | ICD-10-CM | POA: Diagnosis not present

## 2020-04-04 DIAGNOSIS — R29898 Other symptoms and signs involving the musculoskeletal system: Secondary | ICD-10-CM | POA: Diagnosis not present

## 2020-04-04 DIAGNOSIS — I130 Hypertensive heart and chronic kidney disease with heart failure and stage 1 through stage 4 chronic kidney disease, or unspecified chronic kidney disease: Secondary | ICD-10-CM | POA: Diagnosis not present

## 2020-04-04 DIAGNOSIS — N183 Chronic kidney disease, stage 3 unspecified: Secondary | ICD-10-CM | POA: Diagnosis not present

## 2020-04-04 DIAGNOSIS — I5030 Unspecified diastolic (congestive) heart failure: Secondary | ICD-10-CM | POA: Diagnosis not present

## 2020-04-04 DIAGNOSIS — M11262 Other chondrocalcinosis, left knee: Secondary | ICD-10-CM | POA: Diagnosis not present

## 2020-04-05 DIAGNOSIS — N179 Acute kidney failure, unspecified: Secondary | ICD-10-CM | POA: Diagnosis not present

## 2020-04-05 DIAGNOSIS — N183 Chronic kidney disease, stage 3 unspecified: Secondary | ICD-10-CM | POA: Diagnosis not present

## 2020-04-05 DIAGNOSIS — R5381 Other malaise: Secondary | ICD-10-CM | POA: Diagnosis not present

## 2020-04-05 DIAGNOSIS — I130 Hypertensive heart and chronic kidney disease with heart failure and stage 1 through stage 4 chronic kidney disease, or unspecified chronic kidney disease: Secondary | ICD-10-CM | POA: Diagnosis not present

## 2020-04-06 DIAGNOSIS — N183 Chronic kidney disease, stage 3 unspecified: Secondary | ICD-10-CM | POA: Diagnosis not present

## 2020-04-06 DIAGNOSIS — R531 Weakness: Secondary | ICD-10-CM | POA: Diagnosis not present

## 2020-04-06 DIAGNOSIS — M11262 Other chondrocalcinosis, left knee: Secondary | ICD-10-CM | POA: Diagnosis not present

## 2020-04-06 DIAGNOSIS — G8929 Other chronic pain: Secondary | ICD-10-CM | POA: Diagnosis not present

## 2020-04-06 DIAGNOSIS — M549 Dorsalgia, unspecified: Secondary | ICD-10-CM | POA: Diagnosis not present

## 2020-04-06 DIAGNOSIS — I130 Hypertensive heart and chronic kidney disease with heart failure and stage 1 through stage 4 chronic kidney disease, or unspecified chronic kidney disease: Secondary | ICD-10-CM | POA: Diagnosis not present

## 2020-04-06 DIAGNOSIS — M4307 Spondylolysis, lumbosacral region: Secondary | ICD-10-CM | POA: Diagnosis not present

## 2020-04-06 DIAGNOSIS — I5030 Unspecified diastolic (congestive) heart failure: Secondary | ICD-10-CM | POA: Diagnosis not present

## 2020-04-07 DIAGNOSIS — N189 Chronic kidney disease, unspecified: Secondary | ICD-10-CM | POA: Diagnosis not present

## 2020-04-07 DIAGNOSIS — G4733 Obstructive sleep apnea (adult) (pediatric): Secondary | ICD-10-CM | POA: Diagnosis not present

## 2020-04-07 DIAGNOSIS — M47817 Spondylosis without myelopathy or radiculopathy, lumbosacral region: Secondary | ICD-10-CM | POA: Diagnosis not present

## 2020-04-07 DIAGNOSIS — Z9989 Dependence on other enabling machines and devices: Secondary | ICD-10-CM | POA: Diagnosis not present

## 2020-04-07 DIAGNOSIS — N183 Chronic kidney disease, stage 3 unspecified: Secondary | ICD-10-CM | POA: Diagnosis not present

## 2020-04-07 DIAGNOSIS — R5381 Other malaise: Secondary | ICD-10-CM | POA: Diagnosis not present

## 2020-04-07 DIAGNOSIS — G253 Myoclonus: Secondary | ICD-10-CM | POA: Diagnosis not present

## 2020-04-07 DIAGNOSIS — G9340 Encephalopathy, unspecified: Secondary | ICD-10-CM | POA: Diagnosis not present

## 2020-04-07 DIAGNOSIS — N179 Acute kidney failure, unspecified: Secondary | ICD-10-CM | POA: Diagnosis not present

## 2020-04-07 DIAGNOSIS — I5030 Unspecified diastolic (congestive) heart failure: Secondary | ICD-10-CM | POA: Diagnosis not present

## 2020-04-07 DIAGNOSIS — I10 Essential (primary) hypertension: Secondary | ICD-10-CM | POA: Diagnosis not present

## 2020-04-07 DIAGNOSIS — Z7409 Other reduced mobility: Secondary | ICD-10-CM | POA: Diagnosis not present

## 2020-04-07 DIAGNOSIS — I872 Venous insufficiency (chronic) (peripheral): Secondary | ICD-10-CM | POA: Diagnosis not present

## 2020-04-07 DIAGNOSIS — N401 Enlarged prostate with lower urinary tract symptoms: Secondary | ICD-10-CM | POA: Diagnosis not present

## 2020-04-07 DIAGNOSIS — I130 Hypertensive heart and chronic kidney disease with heart failure and stage 1 through stage 4 chronic kidney disease, or unspecified chronic kidney disease: Secondary | ICD-10-CM | POA: Diagnosis not present

## 2020-04-07 DIAGNOSIS — M11262 Other chondrocalcinosis, left knee: Secondary | ICD-10-CM | POA: Diagnosis not present

## 2020-04-08 DIAGNOSIS — I5030 Unspecified diastolic (congestive) heart failure: Secondary | ICD-10-CM | POA: Diagnosis not present

## 2020-04-08 DIAGNOSIS — M11262 Other chondrocalcinosis, left knee: Secondary | ICD-10-CM | POA: Diagnosis not present

## 2020-04-08 DIAGNOSIS — I130 Hypertensive heart and chronic kidney disease with heart failure and stage 1 through stage 4 chronic kidney disease, or unspecified chronic kidney disease: Secondary | ICD-10-CM | POA: Diagnosis not present

## 2020-04-08 DIAGNOSIS — N183 Chronic kidney disease, stage 3 unspecified: Secondary | ICD-10-CM | POA: Diagnosis not present

## 2020-04-08 DIAGNOSIS — G253 Myoclonus: Secondary | ICD-10-CM | POA: Diagnosis not present

## 2020-04-08 DIAGNOSIS — G9340 Encephalopathy, unspecified: Secondary | ICD-10-CM | POA: Diagnosis not present

## 2020-04-08 DIAGNOSIS — R5381 Other malaise: Secondary | ICD-10-CM | POA: Diagnosis not present

## 2020-04-09 DIAGNOSIS — G253 Myoclonus: Secondary | ICD-10-CM | POA: Diagnosis not present

## 2020-04-09 DIAGNOSIS — I130 Hypertensive heart and chronic kidney disease with heart failure and stage 1 through stage 4 chronic kidney disease, or unspecified chronic kidney disease: Secondary | ICD-10-CM | POA: Diagnosis not present

## 2020-04-09 DIAGNOSIS — G8929 Other chronic pain: Secondary | ICD-10-CM | POA: Diagnosis not present

## 2020-04-09 DIAGNOSIS — M11262 Other chondrocalcinosis, left knee: Secondary | ICD-10-CM | POA: Diagnosis not present

## 2020-04-09 DIAGNOSIS — N183 Chronic kidney disease, stage 3 unspecified: Secondary | ICD-10-CM | POA: Diagnosis not present

## 2020-04-09 DIAGNOSIS — M549 Dorsalgia, unspecified: Secondary | ICD-10-CM | POA: Diagnosis not present

## 2020-04-09 DIAGNOSIS — I5030 Unspecified diastolic (congestive) heart failure: Secondary | ICD-10-CM | POA: Diagnosis not present

## 2020-04-09 DIAGNOSIS — R5381 Other malaise: Secondary | ICD-10-CM | POA: Diagnosis not present

## 2020-04-09 DIAGNOSIS — G9340 Encephalopathy, unspecified: Secondary | ICD-10-CM | POA: Diagnosis not present

## 2020-04-09 DIAGNOSIS — N179 Acute kidney failure, unspecified: Secondary | ICD-10-CM | POA: Diagnosis not present

## 2020-04-09 MED ORDER — METOPROLOL TARTRATE 25 MG TABLET
ORAL_TABLET | Freq: Two times a day (BID) | ORAL | 0 refills | 30.00000 days | Status: CP
Start: 2020-04-09 — End: 2020-05-09
  Filled 2020-04-09: qty 15, 30d supply, fill #0

## 2020-04-09 MED ORDER — FAMOTIDINE 20 MG TABLET
ORAL_TABLET | Freq: Two times a day (BID) | ORAL | 0 refills | 30.00000 days | Status: CP
Start: 2020-04-09 — End: 2020-05-09
  Filled 2020-04-09: qty 60, 30d supply, fill #0

## 2020-04-09 MED ORDER — ATORVASTATIN 40 MG TABLET
ORAL_TABLET | Freq: Every day | ORAL | 0 refills | 30.00000 days | Status: CP
Start: 2020-04-09 — End: 2020-05-09
  Filled 2020-04-09: qty 30, 30d supply, fill #0

## 2020-04-09 MED ORDER — FEBUXOSTAT 40 MG TABLET
ORAL_TABLET | Freq: Every day | ORAL | 0 refills | 30 days | Status: CP
Start: 2020-04-09 — End: 2020-04-09

## 2020-04-09 MED ORDER — ALLOPURINOL 100 MG TABLET
ORAL_TABLET | Freq: Every day | ORAL | 0 refills | 30 days | Status: CP
Start: 2020-04-09 — End: 2020-05-09
  Filled 2020-04-09: qty 30, 30d supply, fill #0

## 2020-04-09 MED FILL — ATORVASTATIN 40 MG TABLET: 30 days supply | Qty: 30 | Fill #0 | Status: AC

## 2020-04-09 MED FILL — FAMOTIDINE 20 MG TABLET: 30 days supply | Qty: 60 | Fill #0 | Status: AC

## 2020-04-09 MED FILL — METOPROLOL TARTRATE 25 MG TABLET: 30 days supply | Qty: 15 | Fill #0 | Status: AC

## 2020-04-09 MED FILL — ALLOPURINOL 100 MG TABLET: 30 days supply | Qty: 30 | Fill #0 | Status: AC

## 2020-04-10 MED ORDER — LIDOCAINE 5 % TOPICAL PATCH
MEDICATED_PATCH | Freq: Every day | TRANSDERMAL | 0 refills | 30 days | Status: CP
Start: 2020-04-10 — End: 2020-05-10

## 2020-04-14 DIAGNOSIS — G894 Chronic pain syndrome: Secondary | ICD-10-CM | POA: Diagnosis not present

## 2020-04-14 DIAGNOSIS — Z79891 Long term (current) use of opiate analgesic: Secondary | ICD-10-CM | POA: Diagnosis not present

## 2020-04-14 DIAGNOSIS — M25569 Pain in unspecified knee: Secondary | ICD-10-CM | POA: Diagnosis not present

## 2020-04-14 DIAGNOSIS — M62838 Other muscle spasm: Secondary | ICD-10-CM | POA: Diagnosis not present

## 2020-04-14 DIAGNOSIS — M5416 Radiculopathy, lumbar region: Secondary | ICD-10-CM | POA: Diagnosis not present

## 2020-04-15 ENCOUNTER — Encounter: Admit: 2020-04-15 | Discharge: 2020-05-14 | Payer: MEDICARE

## 2020-04-15 ENCOUNTER — Encounter: Admit: 2020-04-15 | Discharge: 2020-04-16 | Payer: MEDICARE

## 2020-04-15 ENCOUNTER — Inpatient Hospital Stay: Admit: 2020-04-09 | Discharge: 2020-05-14 | Payer: MEDICARE

## 2020-04-15 DIAGNOSIS — I509 Heart failure, unspecified: Secondary | ICD-10-CM | POA: Diagnosis not present

## 2020-04-15 DIAGNOSIS — M1180 Other specified crystal arthropathies, unspecified site: Secondary | ICD-10-CM | POA: Diagnosis not present

## 2020-04-15 DIAGNOSIS — G8929 Other chronic pain: Secondary | ICD-10-CM | POA: Diagnosis not present

## 2020-04-15 DIAGNOSIS — M47817 Spondylosis without myelopathy or radiculopathy, lumbosacral region: Secondary | ICD-10-CM | POA: Diagnosis not present

## 2020-04-15 DIAGNOSIS — I130 Hypertensive heart and chronic kidney disease with heart failure and stage 1 through stage 4 chronic kidney disease, or unspecified chronic kidney disease: Secondary | ICD-10-CM | POA: Diagnosis not present

## 2020-04-15 DIAGNOSIS — I5030 Unspecified diastolic (congestive) heart failure: Secondary | ICD-10-CM | POA: Diagnosis not present

## 2020-04-15 DIAGNOSIS — I951 Orthostatic hypotension: Secondary | ICD-10-CM | POA: Diagnosis not present

## 2020-04-15 DIAGNOSIS — I10 Essential (primary) hypertension: Secondary | ICD-10-CM | POA: Diagnosis not present

## 2020-04-15 DIAGNOSIS — Z9989 Dependence on other enabling machines and devices: Secondary | ICD-10-CM | POA: Diagnosis not present

## 2020-04-15 DIAGNOSIS — N183 Chronic kidney disease, stage 3 unspecified: Secondary | ICD-10-CM | POA: Diagnosis not present

## 2020-04-15 DIAGNOSIS — R4182 Altered mental status, unspecified: Secondary | ICD-10-CM | POA: Diagnosis not present

## 2020-04-15 DIAGNOSIS — N401 Enlarged prostate with lower urinary tract symptoms: Secondary | ICD-10-CM | POA: Diagnosis not present

## 2020-04-15 DIAGNOSIS — M11862 Other specified crystal arthropathies, left knee: Secondary | ICD-10-CM | POA: Diagnosis not present

## 2020-04-15 DIAGNOSIS — G4733 Obstructive sleep apnea (adult) (pediatric): Secondary | ICD-10-CM | POA: Diagnosis not present

## 2020-04-15 DIAGNOSIS — N179 Acute kidney failure, unspecified: Secondary | ICD-10-CM | POA: Diagnosis not present

## 2020-04-15 DIAGNOSIS — I872 Venous insufficiency (chronic) (peripheral): Secondary | ICD-10-CM | POA: Diagnosis not present

## 2020-04-16 ENCOUNTER — Ambulatory Visit: Admit: 2020-04-16 | Discharge: 2020-04-17 | Payer: MEDICARE

## 2020-04-16 DIAGNOSIS — N1832 Stage 3b chronic kidney disease (CMS-HCC): Principal | ICD-10-CM

## 2020-04-16 DIAGNOSIS — N179 Acute kidney failure, unspecified: Secondary | ICD-10-CM | POA: Diagnosis not present

## 2020-04-16 DIAGNOSIS — I10 Essential (primary) hypertension: Secondary | ICD-10-CM | POA: Diagnosis not present

## 2020-04-16 DIAGNOSIS — N410 Acute prostatitis: Secondary | ICD-10-CM | POA: Diagnosis not present

## 2020-04-16 DIAGNOSIS — G4733 Obstructive sleep apnea (adult) (pediatric): Secondary | ICD-10-CM | POA: Diagnosis not present

## 2020-04-20 DIAGNOSIS — I509 Heart failure, unspecified: Principal | ICD-10-CM

## 2020-04-20 DIAGNOSIS — N410 Acute prostatitis: Principal | ICD-10-CM

## 2020-04-20 DIAGNOSIS — N179 Acute kidney failure, unspecified: Principal | ICD-10-CM

## 2020-04-20 MED ORDER — CIPROFLOXACIN 500 MG TABLET
ORAL_TABLET | Freq: Every day | ORAL | 0 refills | 7.00000 days | Status: CP
Start: 2020-04-20 — End: ?

## 2020-04-23 DIAGNOSIS — Z1389 Encounter for screening for other disorder: Secondary | ICD-10-CM | POA: Diagnosis not present

## 2020-04-23 DIAGNOSIS — Z719 Counseling, unspecified: Secondary | ICD-10-CM | POA: Diagnosis not present

## 2020-04-23 DIAGNOSIS — R69 Illness, unspecified: Secondary | ICD-10-CM | POA: Diagnosis not present

## 2020-04-23 DIAGNOSIS — I10 Essential (primary) hypertension: Secondary | ICD-10-CM | POA: Diagnosis not present

## 2020-04-23 DIAGNOSIS — N183 Chronic kidney disease, stage 3 unspecified: Secondary | ICD-10-CM | POA: Diagnosis not present

## 2020-04-24 DIAGNOSIS — I509 Heart failure, unspecified: Principal | ICD-10-CM

## 2020-04-24 DIAGNOSIS — N179 Acute kidney failure, unspecified: Principal | ICD-10-CM

## 2020-04-28 ENCOUNTER — Ambulatory Visit: Admit: 2020-04-28 | Discharge: 2020-04-29 | Payer: MEDICARE

## 2020-04-28 DIAGNOSIS — M25571 Pain in right ankle and joints of right foot: Secondary | ICD-10-CM | POA: Diagnosis not present

## 2020-04-28 DIAGNOSIS — G8929 Other chronic pain: Secondary | ICD-10-CM | POA: Diagnosis not present

## 2020-04-28 DIAGNOSIS — M19071 Primary osteoarthritis, right ankle and foot: Secondary | ICD-10-CM | POA: Diagnosis not present

## 2020-04-28 DIAGNOSIS — M19072 Primary osteoarthritis, left ankle and foot: Secondary | ICD-10-CM | POA: Diagnosis not present

## 2020-04-28 DIAGNOSIS — M19079 Primary osteoarthritis, unspecified ankle and foot: Secondary | ICD-10-CM | POA: Diagnosis not present

## 2020-04-28 DIAGNOSIS — M25572 Pain in left ankle and joints of left foot: Secondary | ICD-10-CM | POA: Diagnosis not present

## 2020-05-06 DIAGNOSIS — I872 Venous insufficiency (chronic) (peripheral): Secondary | ICD-10-CM | POA: Diagnosis not present

## 2020-05-06 DIAGNOSIS — N179 Acute kidney failure, unspecified: Secondary | ICD-10-CM | POA: Diagnosis not present

## 2020-05-06 DIAGNOSIS — M11862 Other specified crystal arthropathies, left knee: Secondary | ICD-10-CM | POA: Diagnosis not present

## 2020-05-06 DIAGNOSIS — N183 Chronic kidney disease, stage 3 unspecified: Secondary | ICD-10-CM | POA: Diagnosis not present

## 2020-05-06 DIAGNOSIS — I5030 Unspecified diastolic (congestive) heart failure: Secondary | ICD-10-CM | POA: Diagnosis not present

## 2020-05-06 DIAGNOSIS — N401 Enlarged prostate with lower urinary tract symptoms: Secondary | ICD-10-CM | POA: Diagnosis not present

## 2020-05-06 DIAGNOSIS — I951 Orthostatic hypotension: Secondary | ICD-10-CM | POA: Diagnosis not present

## 2020-05-06 DIAGNOSIS — M47817 Spondylosis without myelopathy or radiculopathy, lumbosacral region: Secondary | ICD-10-CM | POA: Diagnosis not present

## 2020-05-06 DIAGNOSIS — G8929 Other chronic pain: Secondary | ICD-10-CM | POA: Diagnosis not present

## 2020-05-06 DIAGNOSIS — I130 Hypertensive heart and chronic kidney disease with heart failure and stage 1 through stage 4 chronic kidney disease, or unspecified chronic kidney disease: Secondary | ICD-10-CM | POA: Diagnosis not present

## 2020-05-08 ENCOUNTER — Encounter: Admit: 2020-05-08 | Discharge: 2020-05-09 | Payer: MEDICARE | Attending: Rheumatology | Primary: Rheumatology

## 2020-05-08 DIAGNOSIS — M1180 Other specified crystal arthropathies, unspecified site: Secondary | ICD-10-CM | POA: Diagnosis not present

## 2020-05-08 DIAGNOSIS — M109 Gout, unspecified: Secondary | ICD-10-CM | POA: Diagnosis not present

## 2020-05-08 DIAGNOSIS — N183 Chronic kidney disease, stage 3 unspecified: Principal | ICD-10-CM

## 2020-05-08 DIAGNOSIS — Z6841 Body Mass Index (BMI) 40.0 and over, adult: Principal | ICD-10-CM

## 2020-05-08 DIAGNOSIS — Z87891 Personal history of nicotine dependence: Principal | ICD-10-CM

## 2020-05-08 DIAGNOSIS — I951 Orthostatic hypotension: Principal | ICD-10-CM

## 2020-05-08 DIAGNOSIS — I509 Heart failure, unspecified: Principal | ICD-10-CM

## 2020-05-08 DIAGNOSIS — I5030 Unspecified diastolic (congestive) heart failure: Principal | ICD-10-CM

## 2020-05-08 DIAGNOSIS — G8929 Other chronic pain: Principal | ICD-10-CM

## 2020-05-08 DIAGNOSIS — N179 Acute kidney failure, unspecified: Principal | ICD-10-CM

## 2020-05-08 DIAGNOSIS — M11862 Other specified crystal arthropathies, left knee: Principal | ICD-10-CM

## 2020-05-08 DIAGNOSIS — G4733 Obstructive sleep apnea (adult) (pediatric): Principal | ICD-10-CM

## 2020-05-08 DIAGNOSIS — N401 Enlarged prostate with lower urinary tract symptoms: Principal | ICD-10-CM

## 2020-05-08 DIAGNOSIS — Z9181 History of falling: Principal | ICD-10-CM

## 2020-05-08 DIAGNOSIS — I130 Hypertensive heart and chronic kidney disease with heart failure and stage 1 through stage 4 chronic kidney disease, or unspecified chronic kidney disease: Principal | ICD-10-CM

## 2020-05-08 DIAGNOSIS — I872 Venous insufficiency (chronic) (peripheral): Principal | ICD-10-CM

## 2020-05-08 DIAGNOSIS — M47817 Spondylosis without myelopathy or radiculopathy, lumbosacral region: Principal | ICD-10-CM

## 2020-05-08 MED ORDER — COLCHICINE 0.6 MG TABLET
ORAL_TABLET | Freq: Every day | ORAL | 1 refills | 90.00000 days | Status: CP
Start: 2020-05-08 — End: 2020-07-11

## 2020-05-12 ENCOUNTER — Encounter: Admit: 2020-05-12 | Discharge: 2020-05-13 | Payer: MEDICARE

## 2020-05-12 DIAGNOSIS — I890 Lymphedema, not elsewhere classified: Secondary | ICD-10-CM | POA: Diagnosis not present

## 2020-05-15 ENCOUNTER — Encounter: Admit: 2020-05-15 | Discharge: 2020-05-15 | Payer: MEDICARE

## 2020-05-17 DIAGNOSIS — Z6841 Body Mass Index (BMI) 40.0 and over, adult: Principal | ICD-10-CM

## 2020-05-17 DIAGNOSIS — M11862 Other specified crystal arthropathies, left knee: Principal | ICD-10-CM

## 2020-05-17 DIAGNOSIS — I951 Orthostatic hypotension: Principal | ICD-10-CM

## 2020-05-17 DIAGNOSIS — Z9181 History of falling: Principal | ICD-10-CM

## 2020-05-17 DIAGNOSIS — I5030 Unspecified diastolic (congestive) heart failure: Principal | ICD-10-CM

## 2020-05-17 DIAGNOSIS — N183 Chronic kidney disease, stage 3 unspecified: Principal | ICD-10-CM

## 2020-05-17 DIAGNOSIS — M47817 Spondylosis without myelopathy or radiculopathy, lumbosacral region: Principal | ICD-10-CM

## 2020-05-17 DIAGNOSIS — G8929 Other chronic pain: Principal | ICD-10-CM

## 2020-05-17 DIAGNOSIS — Z87891 Personal history of nicotine dependence: Principal | ICD-10-CM

## 2020-05-17 DIAGNOSIS — I130 Hypertensive heart and chronic kidney disease with heart failure and stage 1 through stage 4 chronic kidney disease, or unspecified chronic kidney disease: Principal | ICD-10-CM

## 2020-05-17 DIAGNOSIS — N401 Enlarged prostate with lower urinary tract symptoms: Principal | ICD-10-CM

## 2020-05-17 DIAGNOSIS — G4733 Obstructive sleep apnea (adult) (pediatric): Principal | ICD-10-CM

## 2020-05-17 DIAGNOSIS — N179 Acute kidney failure, unspecified: Principal | ICD-10-CM

## 2020-05-17 DIAGNOSIS — I872 Venous insufficiency (chronic) (peripheral): Principal | ICD-10-CM

## 2020-05-19 DIAGNOSIS — M5416 Radiculopathy, lumbar region: Secondary | ICD-10-CM | POA: Diagnosis not present

## 2020-05-19 DIAGNOSIS — Z79899 Other long term (current) drug therapy: Secondary | ICD-10-CM | POA: Diagnosis not present

## 2020-05-19 DIAGNOSIS — M62838 Other muscle spasm: Secondary | ICD-10-CM | POA: Diagnosis not present

## 2020-05-19 DIAGNOSIS — M25569 Pain in unspecified knee: Secondary | ICD-10-CM | POA: Diagnosis not present

## 2020-05-19 DIAGNOSIS — G894 Chronic pain syndrome: Secondary | ICD-10-CM | POA: Diagnosis not present

## 2020-05-19 DIAGNOSIS — Z23 Encounter for immunization: Secondary | ICD-10-CM | POA: Diagnosis not present

## 2020-05-19 DIAGNOSIS — Z79891 Long term (current) use of opiate analgesic: Secondary | ICD-10-CM | POA: Diagnosis not present

## 2020-05-19 DIAGNOSIS — R69 Illness, unspecified: Secondary | ICD-10-CM | POA: Diagnosis not present

## 2020-05-21 ENCOUNTER — Encounter: Admit: 2020-05-21 | Discharge: 2020-05-22 | Payer: MEDICARE

## 2020-05-21 DIAGNOSIS — N1832 Stage 3b chronic kidney disease (CMS-HCC): Principal | ICD-10-CM

## 2020-05-21 DIAGNOSIS — N189 Chronic kidney disease, unspecified: Secondary | ICD-10-CM | POA: Diagnosis not present

## 2020-05-26 DIAGNOSIS — N1831 Stage 3a chronic kidney disease (CMS-HCC): Principal | ICD-10-CM

## 2020-05-26 MED ORDER — AMLODIPINE 5 MG TABLET
ORAL_TABLET | Freq: Every day | ORAL | 3 refills | 90 days | Status: CP
Start: 2020-05-26 — End: 2020-07-11

## 2020-06-01 ENCOUNTER — Encounter: Admit: 2020-06-01 | Discharge: 2020-06-02 | Payer: MEDICARE

## 2020-06-01 DIAGNOSIS — N1831 Stage 3a chronic kidney disease (CMS-HCC): Principal | ICD-10-CM

## 2020-06-02 ENCOUNTER — Encounter: Admit: 2020-06-02 | Discharge: 2020-06-03 | Payer: MEDICARE

## 2020-06-02 DIAGNOSIS — G4733 Obstructive sleep apnea (adult) (pediatric): Secondary | ICD-10-CM | POA: Diagnosis not present

## 2020-06-02 DIAGNOSIS — I10 Essential (primary) hypertension: Secondary | ICD-10-CM | POA: Diagnosis not present

## 2020-06-02 DIAGNOSIS — N410 Acute prostatitis: Secondary | ICD-10-CM | POA: Diagnosis not present

## 2020-06-02 DIAGNOSIS — N1832 Stage 3b chronic kidney disease (CMS-HCC): Principal | ICD-10-CM

## 2020-06-16 DIAGNOSIS — M109 Gout, unspecified: Secondary | ICD-10-CM | POA: Diagnosis not present

## 2020-06-16 DIAGNOSIS — M5416 Radiculopathy, lumbar region: Secondary | ICD-10-CM | POA: Diagnosis not present

## 2020-06-16 DIAGNOSIS — N183 Chronic kidney disease, stage 3 unspecified: Secondary | ICD-10-CM | POA: Diagnosis not present

## 2020-06-16 DIAGNOSIS — M25569 Pain in unspecified knee: Secondary | ICD-10-CM | POA: Diagnosis not present

## 2020-06-16 DIAGNOSIS — I10 Essential (primary) hypertension: Secondary | ICD-10-CM | POA: Diagnosis not present

## 2020-06-16 DIAGNOSIS — M62838 Other muscle spasm: Secondary | ICD-10-CM | POA: Diagnosis not present

## 2020-06-16 DIAGNOSIS — Z79891 Long term (current) use of opiate analgesic: Secondary | ICD-10-CM | POA: Diagnosis not present

## 2020-06-16 DIAGNOSIS — G894 Chronic pain syndrome: Secondary | ICD-10-CM | POA: Diagnosis not present

## 2020-07-02 DIAGNOSIS — M310 Hypersensitivity angiitis: Principal | ICD-10-CM

## 2020-07-06 MED ORDER — TRIAMCINOLONE ACETONIDE 0.1 % TOPICAL OINTMENT
Freq: Two times a day (BID) | TOPICAL | 0 refills | 0.00000 days | Status: SS
Start: 2020-07-06 — End: 2020-07-09

## 2020-07-07 ENCOUNTER — Ambulatory Visit: Admit: 2020-07-07 | Discharge: 2020-07-08 | Payer: MEDICARE

## 2020-07-07 ENCOUNTER — Ambulatory Visit
Admit: 2020-07-07 | Discharge: 2020-07-11 | Disposition: A | Discharge status: Home / Self Care | Payer: MEDICARE | Admitting: Internal Medicine

## 2020-07-07 ENCOUNTER — Encounter
Admit: 2020-07-07 | Discharge: 2020-07-11 | Disposition: A | Discharge status: Home / Self Care | Payer: MEDICARE | Admitting: Internal Medicine

## 2020-07-07 DIAGNOSIS — N170 Acute kidney failure with tubular necrosis: Principal | ICD-10-CM

## 2020-07-07 DIAGNOSIS — I959 Hypotension, unspecified: Principal | ICD-10-CM

## 2020-07-07 DIAGNOSIS — A419 Sepsis, unspecified organism: Principal | ICD-10-CM

## 2020-07-07 DIAGNOSIS — M1120 Other chondrocalcinosis, unspecified site: Principal | ICD-10-CM

## 2020-07-07 DIAGNOSIS — N390 Urinary tract infection, site not specified: Principal | ICD-10-CM

## 2020-07-07 DIAGNOSIS — Z6841 Body Mass Index (BMI) 40.0 and over, adult: Principal | ICD-10-CM

## 2020-07-07 DIAGNOSIS — J810 Acute pulmonary edema: Principal | ICD-10-CM

## 2020-07-07 DIAGNOSIS — G4733 Obstructive sleep apnea (adult) (pediatric): Principal | ICD-10-CM

## 2020-07-07 DIAGNOSIS — G8929 Other chronic pain: Principal | ICD-10-CM

## 2020-07-07 DIAGNOSIS — B9689 Other specified bacterial agents as the cause of diseases classified elsewhere: Principal | ICD-10-CM

## 2020-07-07 DIAGNOSIS — T402X5A Adverse effect of other opioids, initial encounter: Principal | ICD-10-CM

## 2020-07-07 DIAGNOSIS — E8770 Fluid overload, unspecified: Principal | ICD-10-CM

## 2020-07-07 DIAGNOSIS — G928 Other toxic encephalopathy: Principal | ICD-10-CM

## 2020-07-07 DIAGNOSIS — R6520 Severe sepsis without septic shock: Principal | ICD-10-CM

## 2020-07-07 DIAGNOSIS — I5032 Chronic diastolic (congestive) heart failure: Principal | ICD-10-CM

## 2020-07-07 DIAGNOSIS — E559 Vitamin D deficiency, unspecified: Principal | ICD-10-CM

## 2020-07-07 DIAGNOSIS — M549 Dorsalgia, unspecified: Principal | ICD-10-CM

## 2020-07-07 DIAGNOSIS — N183 Chronic kidney disease, stage 3 unspecified: Principal | ICD-10-CM

## 2020-07-07 DIAGNOSIS — N400 Enlarged prostate without lower urinary tract symptoms: Principal | ICD-10-CM

## 2020-07-07 DIAGNOSIS — N179 Acute kidney failure, unspecified: Principal | ICD-10-CM

## 2020-07-07 DIAGNOSIS — I130 Hypertensive heart and chronic kidney disease with heart failure and stage 1 through stage 4 chronic kidney disease, or unspecified chronic kidney disease: Principal | ICD-10-CM

## 2020-07-07 DIAGNOSIS — R251 Tremor, unspecified: Principal | ICD-10-CM

## 2020-07-07 DIAGNOSIS — M47817 Spondylosis without myelopathy or radiculopathy, lumbosacral region: Principal | ICD-10-CM

## 2020-07-07 DIAGNOSIS — Z20822 Contact with and (suspected) exposure to covid-19: Principal | ICD-10-CM

## 2020-07-07 DIAGNOSIS — Z66 Do not resuscitate: Principal | ICD-10-CM

## 2020-07-07 DIAGNOSIS — N19 Unspecified kidney failure: Principal | ICD-10-CM

## 2020-07-07 DIAGNOSIS — G9341 Metabolic encephalopathy: Principal | ICD-10-CM

## 2020-07-07 DIAGNOSIS — M19079 Primary osteoarthritis, unspecified ankle and foot: Principal | ICD-10-CM

## 2020-07-07 DIAGNOSIS — E785 Hyperlipidemia, unspecified: Principal | ICD-10-CM

## 2020-07-07 DIAGNOSIS — F1410 Cocaine abuse, uncomplicated: Principal | ICD-10-CM

## 2020-07-07 DIAGNOSIS — R0602 Shortness of breath: Principal | ICD-10-CM

## 2020-07-08 DIAGNOSIS — M1120 Other chondrocalcinosis, unspecified site: Principal | ICD-10-CM

## 2020-07-08 DIAGNOSIS — G8929 Other chronic pain: Principal | ICD-10-CM

## 2020-07-08 DIAGNOSIS — N400 Enlarged prostate without lower urinary tract symptoms: Principal | ICD-10-CM

## 2020-07-08 DIAGNOSIS — I5032 Chronic diastolic (congestive) heart failure: Principal | ICD-10-CM

## 2020-07-08 DIAGNOSIS — M47817 Spondylosis without myelopathy or radiculopathy, lumbosacral region: Principal | ICD-10-CM

## 2020-07-08 DIAGNOSIS — N170 Acute kidney failure with tubular necrosis: Principal | ICD-10-CM

## 2020-07-08 DIAGNOSIS — E785 Hyperlipidemia, unspecified: Principal | ICD-10-CM

## 2020-07-08 DIAGNOSIS — E559 Vitamin D deficiency, unspecified: Principal | ICD-10-CM

## 2020-07-08 DIAGNOSIS — T402X5A Adverse effect of other opioids, initial encounter: Principal | ICD-10-CM

## 2020-07-08 DIAGNOSIS — Z66 Do not resuscitate: Principal | ICD-10-CM

## 2020-07-08 DIAGNOSIS — R251 Tremor, unspecified: Principal | ICD-10-CM

## 2020-07-08 DIAGNOSIS — N183 Chronic kidney disease, stage 3 unspecified: Principal | ICD-10-CM

## 2020-07-08 DIAGNOSIS — M549 Dorsalgia, unspecified: Principal | ICD-10-CM

## 2020-07-08 DIAGNOSIS — G928 Other toxic encephalopathy: Principal | ICD-10-CM

## 2020-07-08 DIAGNOSIS — E8770 Fluid overload, unspecified: Principal | ICD-10-CM

## 2020-07-08 DIAGNOSIS — I130 Hypertensive heart and chronic kidney disease with heart failure and stage 1 through stage 4 chronic kidney disease, or unspecified chronic kidney disease: Principal | ICD-10-CM

## 2020-07-08 DIAGNOSIS — I959 Hypotension, unspecified: Principal | ICD-10-CM

## 2020-07-08 DIAGNOSIS — Z20822 Contact with and (suspected) exposure to covid-19: Principal | ICD-10-CM

## 2020-07-08 DIAGNOSIS — R6520 Severe sepsis without septic shock: Principal | ICD-10-CM

## 2020-07-08 DIAGNOSIS — F1410 Cocaine abuse, uncomplicated: Principal | ICD-10-CM

## 2020-07-08 DIAGNOSIS — G9341 Metabolic encephalopathy: Principal | ICD-10-CM

## 2020-07-08 DIAGNOSIS — Z6841 Body Mass Index (BMI) 40.0 and over, adult: Principal | ICD-10-CM

## 2020-07-08 DIAGNOSIS — G4733 Obstructive sleep apnea (adult) (pediatric): Principal | ICD-10-CM

## 2020-07-08 DIAGNOSIS — A419 Sepsis, unspecified organism: Principal | ICD-10-CM

## 2020-07-08 DIAGNOSIS — N390 Urinary tract infection, site not specified: Principal | ICD-10-CM

## 2020-07-08 DIAGNOSIS — J810 Acute pulmonary edema: Principal | ICD-10-CM

## 2020-07-11 MED ORDER — ALLOPURINOL 100 MG TABLET
ORAL_TABLET | Freq: Every day | ORAL | 0 refills | 30 days | Status: CP
Start: 2020-07-11 — End: 2020-08-10

## 2020-07-11 MED ORDER — ATORVASTATIN 40 MG TABLET
ORAL_TABLET | Freq: Every day | ORAL | 0 refills | 30 days | Status: CP
Start: 2020-07-11 — End: 2020-08-10

## 2020-07-11 MED ORDER — BUMETANIDE 1 MG TABLET
ORAL_TABLET | Freq: Every day | ORAL | 0 refills | 30 days | Status: CP
Start: 2020-07-11 — End: 2020-08-10

## 2020-07-12 MED ORDER — CEFDINIR 300 MG CAPSULE
ORAL_CAPSULE | Freq: Two times a day (BID) | ORAL | 0 refills | 6 days | Status: CP
Start: 2020-07-12 — End: 2020-07-31

## 2020-07-22 ENCOUNTER — Encounter: Admit: 2020-07-22 | Discharge: 2020-07-23 | Payer: MEDICARE

## 2020-07-22 DIAGNOSIS — I10 Essential (primary) hypertension: Principal | ICD-10-CM

## 2020-07-22 DIAGNOSIS — N1832 Stage 3b chronic kidney disease (CMS-HCC): Principal | ICD-10-CM

## 2020-07-23 ENCOUNTER — Encounter: Admit: 2020-07-23 | Discharge: 2020-07-24 | Payer: MEDICARE

## 2020-07-23 DIAGNOSIS — G4733 Obstructive sleep apnea (adult) (pediatric): Principal | ICD-10-CM

## 2020-07-23 DIAGNOSIS — I10 Essential (primary) hypertension: Principal | ICD-10-CM

## 2020-07-23 DIAGNOSIS — N1832 Stage 3b chronic kidney disease (CMS-HCC): Principal | ICD-10-CM

## 2020-07-23 MED ORDER — AMLODIPINE 5 MG TABLET
ORAL_TABLET | Freq: Every day | ORAL | 3 refills | 90 days | Status: CP
Start: 2020-07-23 — End: 2021-07-23

## 2020-07-31 ENCOUNTER — Encounter
Admit: 2020-07-31 | Discharge: 2020-08-01 | Payer: MEDICARE | Attending: Physician Assistant | Primary: Physician Assistant

## 2020-07-31 DIAGNOSIS — Z9989 Dependence on other enabling machines and devices: Principal | ICD-10-CM

## 2020-08-03 DIAGNOSIS — R0602 Shortness of breath: Principal | ICD-10-CM

## 2020-08-20 DIAGNOSIS — M310 Hypersensitivity angiitis: Principal | ICD-10-CM

## 2020-08-20 DIAGNOSIS — R0602 Shortness of breath: Principal | ICD-10-CM

## 2020-10-06 ENCOUNTER — Encounter: Admit: 2020-10-06 | Discharge: 2020-10-07 | Payer: MEDICARE

## 2020-10-06 DIAGNOSIS — M25571 Pain in right ankle and joints of right foot: Principal | ICD-10-CM

## 2020-10-06 DIAGNOSIS — M25572 Pain in left ankle and joints of left foot: Principal | ICD-10-CM

## 2020-10-06 DIAGNOSIS — M19079 Primary osteoarthritis, unspecified ankle and foot: Principal | ICD-10-CM

## 2020-10-06 DIAGNOSIS — G8929 Other chronic pain: Principal | ICD-10-CM

## 2020-10-16 ENCOUNTER — Encounter: Admit: 2020-10-16 | Discharge: 2020-10-17 | Payer: MEDICARE | Attending: Registered Nurse | Primary: Registered Nurse

## 2020-10-16 DIAGNOSIS — G4733 Obstructive sleep apnea (adult) (pediatric): Principal | ICD-10-CM

## 2020-10-21 ENCOUNTER — Encounter: Admit: 2020-10-21 | Discharge: 2020-10-22 | Payer: MEDICARE

## 2020-10-21 DIAGNOSIS — N179 Acute kidney failure, unspecified: Principal | ICD-10-CM

## 2020-10-21 DIAGNOSIS — N1832 Stage 3b chronic kidney disease (CMS-HCC): Principal | ICD-10-CM

## 2020-10-21 DIAGNOSIS — I10 Essential (primary) hypertension: Principal | ICD-10-CM

## 2020-10-28 ENCOUNTER — Telehealth: Admit: 2020-10-28 | Discharge: 2020-10-29 | Payer: MEDICARE

## 2020-10-28 DIAGNOSIS — R3129 Other microscopic hematuria: Principal | ICD-10-CM

## 2020-10-28 DIAGNOSIS — I10 Essential (primary) hypertension: Principal | ICD-10-CM

## 2020-10-28 DIAGNOSIS — N1832 Stage 3b chronic kidney disease (CMS-HCC): Principal | ICD-10-CM

## 2020-11-02 ENCOUNTER — Encounter: Admit: 2020-11-02 | Discharge: 2020-11-03 | Payer: MEDICARE | Attending: Rheumatology | Primary: Rheumatology

## 2020-11-02 DIAGNOSIS — M109 Gout, unspecified: Principal | ICD-10-CM

## 2020-11-02 MED ORDER — ALLOPURINOL 100 MG TABLET
ORAL_TABLET | Freq: Every day | ORAL | 2 refills | 30.00000 days | Status: CP
Start: 2020-11-02 — End: 2021-01-31

## 2020-11-24 DIAGNOSIS — Z1211 Encounter for screening for malignant neoplasm of colon: Principal | ICD-10-CM

## 2020-11-24 DIAGNOSIS — N644 Mastodynia: Principal | ICD-10-CM

## 2020-11-26 ENCOUNTER — Encounter: Admit: 2020-11-26 | Discharge: 2020-11-27 | Payer: MEDICARE

## 2020-11-26 DIAGNOSIS — H43393 Other vitreous opacities, bilateral: Principal | ICD-10-CM

## 2020-11-27 ENCOUNTER — Encounter: Admit: 2020-11-27 | Discharge: 2020-11-28 | Payer: MEDICARE

## 2020-11-27 DIAGNOSIS — M109 Gout, unspecified: Principal | ICD-10-CM

## 2020-12-01 ENCOUNTER — Encounter: Admit: 2020-12-01 | Discharge: 2020-12-02 | Payer: MEDICARE

## 2020-12-03 MED ORDER — ALLOPURINOL 100 MG TABLET
ORAL_TABLET | Freq: Every day | ORAL | 4 refills | 30.00000 days | Status: CP
Start: 2020-12-03 — End: 2021-12-03

## 2020-12-25 MED ORDER — PEG 3350-ELECTROLYTES 236 GRAM-22.74 GRAM-6.74 GRAM-5.86 GRAM SOLUTION
ORAL | 0 refills | 0 days | Status: CP
Start: 2020-12-25 — End: ?

## 2020-12-29 DIAGNOSIS — M109 Gout, unspecified: Principal | ICD-10-CM

## 2021-01-07 ENCOUNTER — Ambulatory Visit: Admit: 2021-01-07 | Discharge: 2021-01-08 | Payer: MEDICARE | Attending: Ophthalmology | Primary: Ophthalmology

## 2021-01-07 DIAGNOSIS — H409 Unspecified glaucoma: Principal | ICD-10-CM

## 2021-01-07 MED ORDER — LATANOPROST 0.005 % EYE DROPS
Freq: Every evening | OPHTHALMIC | 3 refills | 50 days | Status: CP
Start: 2021-01-07 — End: 2022-01-07

## 2021-01-21 MED FILL — PEG 3350-ELECTROLYTES 236 GRAM-22.74 GRAM-6.74 GRAM-5.86 GRAM SOLUTION: ORAL | 1 days supply | Qty: 4000 | Fill #0

## 2021-02-04 ENCOUNTER — Encounter: Admit: 2021-02-04 | Discharge: 2021-02-05 | Payer: MEDICARE

## 2021-02-12 ENCOUNTER — Encounter: Admit: 2021-02-12 | Discharge: 2021-02-12 | Payer: MEDICARE

## 2021-02-25 ENCOUNTER — Encounter: Admit: 2021-02-25 | Discharge: 2021-02-26 | Payer: MEDICARE | Attending: Ophthalmology | Primary: Ophthalmology

## 2021-03-11 MED ORDER — ALLOPURINOL 300 MG TABLET
ORAL_TABLET | Freq: Every day | ORAL | 5 refills | 30 days | Status: CP
Start: 2021-03-11 — End: 2022-03-11

## 2021-03-19 ENCOUNTER — Ambulatory Visit: Admit: 2021-03-19 | Discharge: 2021-03-20 | Payer: MEDICARE

## 2021-03-19 DIAGNOSIS — N179 Acute kidney failure, unspecified: Principal | ICD-10-CM

## 2021-03-19 DIAGNOSIS — N1832 Stage 3b chronic kidney disease (CMS-HCC): Principal | ICD-10-CM

## 2021-03-19 DIAGNOSIS — I10 Essential (primary) hypertension: Principal | ICD-10-CM

## 2021-03-25 DIAGNOSIS — N179 Acute kidney failure, unspecified: Principal | ICD-10-CM

## 2021-03-25 DIAGNOSIS — I10 Essential (primary) hypertension: Principal | ICD-10-CM

## 2021-03-25 DIAGNOSIS — N1832 Stage 3b chronic kidney disease (CMS-HCC): Principal | ICD-10-CM

## 2021-03-25 DIAGNOSIS — R3129 Other microscopic hematuria: Principal | ICD-10-CM

## 2021-03-29 ENCOUNTER — Ambulatory Visit
Admit: 2021-03-29 | Discharge: 2021-03-30 | Payer: MEDICARE | Attending: Student in an Organized Health Care Education/Training Program | Primary: Student in an Organized Health Care Education/Training Program

## 2021-03-29 DIAGNOSIS — M109 Gout, unspecified: Principal | ICD-10-CM

## 2021-04-05 ENCOUNTER — Ambulatory Visit: Admit: 2021-04-05 | Discharge: 2021-04-06 | Payer: MEDICARE

## 2021-04-05 DIAGNOSIS — R3129 Other microscopic hematuria: Principal | ICD-10-CM

## 2021-04-05 DIAGNOSIS — G4733 Obstructive sleep apnea (adult) (pediatric): Principal | ICD-10-CM

## 2021-04-05 DIAGNOSIS — I10 Essential (primary) hypertension: Principal | ICD-10-CM

## 2021-04-05 DIAGNOSIS — N1832 Stage 3b chronic kidney disease (CMS-HCC): Principal | ICD-10-CM

## 2021-05-24 NOTE — Progress Notes (Signed)
05/26/21 6:18 PM   Tito Dine 02-10-1951 [id number]  Referring provider:  Marguerita Merles, [place],  [place] [address] Chief Complaint  Patient presents with   Elevated PSA     HPI: Wyatt Hernandez is a 70 y.o.male who presents today for further evaluation of elevated PSA.   He was referred by [facility] for an elevated PSA of 5.1 on 05/11/2021, with no previous PSA for comparison.   He was previously seen in clinic by Dr.Stoioff in 2015 for microscopic hematuria and BPH with LUTS.   He reports today that he has had prostate problems from an early age. He reports that it started when he was in 8th grade he had another occurrence when he was in his 60s that was acute prostatitis.   He has multiple medical comorbidities as outlined below.  He does have baseline urinary symptoms as outlined below had a he relates this to his diuretic medications.   IPSS     Row Name 05/25/21 1500         International Prostate Symptom Score   How often have you had the sensation of not emptying your bladder? Less than half the time     How often have you had to urinate less than every two hours? Not at All     How often have you found you stopped and started again several times when you urinated? Not at All     How often have you found it difficult to postpone urination? Not at All     How often have you had a weak urinary stream? About half the time     How often have you had to strain to start urination? Not at All     How many times did you typically get up at night to urinate? 3 Times     Total IPSS Score 8       Quality of Life due to urinary symptoms   If you were to spend the rest of your life with your urinary condition just the way it is now how would you feel about that? 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), w CHF (HCC)    CHF (congestive heart failure) (HCC)    Gout    Hypertension    Renal disorder    Sleep apnea    Vitamin D deficiency     Surgical History: Past Surgical History:  Procedure Laterality Date   COLONOSCOPY WITH PROPOFOL N/A 02/03/2017   Procedure: COLONOSCOPY WITH PROPOFOL;  Surgeon: Lollie Sails, MD;  [facility] END[facility]COPY;  Service: Endoscopy;  Laterality: N/A;   ESOPHAGOGASTRODUODENOSCOPY (EGD) WITH PROPOFOL N/A 02/03/2017   Procedure: ESOPHAGOGASTRODUODENOSCOPY (EGD) WITH PROPOFOL;  Surgeon: Lollie Sails, MD;  Location: [facility] ENDOSCOPY;  Service: Endoscopy;  Laterality: N/A;   HERNIA REPAIR     TYMPANOPLASTY Left     Home Medications:  Allergies as of 05/25/2021       Reactions   Shellfish Allergy Nausea And Vomiting        Medication List        Accurate as of May 25, 2021 11:59 PM. If you have any questions, ask your nurse or doctor.          amLODipine 10  MG tablet Commonly known as: NORVASC Take 10 mg by mouth daily.   cyanocobalamin 50 MCG tablet Take 1 tablet by mouth daily.   fluticasone 50 MCG/ACT nasal spray Commonly known as: FLONASE Place into both nostrils daily.   hydrochlorothiazide 12.5 MG capsule Commonly known as: MICROZIDE Take 12.5 mg by mouth daily.   lisinopril 40 MG tablet Commonly known as: ZESTRIL Take 40 mg by mouth daily.   metoprolol tartrate 50 MG tablet Commonly known as: LOPRESSOR Take 1 tablet (50 mg total) by mouth 2 (two) times daily.   oxyCODONE-acetaminophen 5-325 MG tablet Commonly known as: PERCOCET/ROXICET Take 1 tablet by mouth daily.   ranitidine 150 MG tablet Commonly known as: ZANTAC Take 150 mg by mouth 2 (two) times daily.   tadalafil 20 MG tablet Commonly known as: CIALIS Take 20 mg by mouth daily as needed for erectile dysfunction.   tamsulosin 0.4 MG Caps capsule Commonly known as: FLOMAX Take 0.4 mg by mouth daily.   Vitamin D  (Cholecalciferol) 10 MCG (400 UNIT) Tabs Take 1 tablet by mouth daily.        Allergies:  Allergies  Allergen Reactions   Shellfish Allergy Nausea And Vomiting    Family History: Family History  Problem Relation Age of Onset   Heart failure Father    Heart attack Father 30   Hyperlipidemia Mother    Hypertension Mother    Heart failure Sister    Heart attack Sister    Heart attack Brother 71   Heart attack Brother 12    Social History:  reports that he has never smoked. He has never used smokeless tobacco. He reports current alcohol use. He reports current drug use. Drug: Oxycodone.   Physical Exam: BP 126/75   Pulse 92   Ht 5\' 2"  (1.575 m)   Wt (!) 304 lb (137.9 kg)   BMI 55.60 kg/m   Constitutional:  Alert and oriented, No acute distress.  Obese. HEENT: [place] AT, moist mucus membranes.  Trachea midline, no masses. Cardiovascular: No clubbing, cyanosis, or edema. Respiratory: Normal respiratory effort, no increased work of breathing. Rectal: Limited due to patients habitus only able to palpate apex which was nontender without nodularity.  Skin: No rashes, bruises or suspicious lesions. Neurologic: Grossly intact, no focal deficits, moving all 4 extremities. Psychiatric: Normal mood and affect. He has a walker   Laboratory Data:  Lab Results  Component Value Date   CREATININE 1.66 (H) 03/30/2017     Lab Results  Component Value Date   HGBA1C 5.6 08/15/2016     Assessment & Plan:   Elevated PSA - UA negative, no evidence of infection is contributing factor  -  We reviewed the implications of an elevated PSA and the uncertainty surrounding it. In general, a man's PSA increases with age and is produced by both normal and cancerous prostate tissue. Differential for elevated PSA is BPH, prostate cancer, infection, recent intercourse/ejaculation, prostate infarction, recent urethroscopic manipulation (foley placement/cystoscopy) and prostatitis. Management of an  elevated PSA can include observation or prostate biopsy and wediscussed this in detail.  - In light of multiple medical comorbidity would not recommend biopsy at this time but would recommend surveillance   - PSA; pending (repeat)  - Rectal exam was limited    Follow-up pending PSA-we will call with results and follow-up plan   10/13/2016 as a scribe for Tawni Millers, MD.,have documented all relevant documentation on the behalf of [name], MD,as directed by  [name],  MD while in the presence of Hollice Espy, MD.  I have reviewed the above documentation for accuracy and completeness, and I agree with the above.   Hollice Espy, MD  [facility] [address] [place], [place] [address] [phone]

## 2021-05-25 ENCOUNTER — Ambulatory Visit (INDEPENDENT_AMBULATORY_CARE_PROVIDER_SITE_OTHER): Payer: Medicare Other | Admitting: Urology

## 2021-05-25 ENCOUNTER — Other Ambulatory Visit: Payer: Self-pay

## 2021-05-25 ENCOUNTER — Encounter: Payer: Self-pay | Admitting: Urology

## 2021-05-25 VITALS — BP 126/75 | HR 92 | Ht 62.0 in | Wt 304.0 lb

## 2021-05-25 DIAGNOSIS — R972 Elevated prostate specific antigen [PSA]: Secondary | ICD-10-CM | POA: Diagnosis not present

## 2021-05-26 ENCOUNTER — Telehealth: Payer: Self-pay | Admitting: Urology

## 2021-05-26 DIAGNOSIS — R972 Elevated prostate specific antigen [PSA]: Secondary | ICD-10-CM

## 2021-05-26 DIAGNOSIS — M5410 Radiculopathy, site unspecified: Principal | ICD-10-CM

## 2021-05-26 LAB — URINALYSIS, COMPLETE
Bilirubin, UA: NEGATIVE
Glucose, UA: NEGATIVE
Ketones, UA: NEGATIVE
Leukocytes,UA: NEGATIVE
Nitrite, UA: NEGATIVE
Protein,UA: NEGATIVE
Specific Gravity, UA: 1.02 (ref 1.005–1.030)
Urobilinogen, Ur: 0.2 mg/dL (ref 0.2–1.0)
pH, UA: 6 (ref 5.0–7.5)

## 2021-05-26 LAB — MICROSCOPIC EXAMINATION: Bacteria, UA: NONE SEEN

## 2021-05-26 LAB — PSA: Prostate Specific Ag, Serum: 5.9 ng/mL — ABNORMAL HIGH (ref 0.0–4.0)

## 2021-05-26 NOTE — Telephone Encounter (Signed)
Mr. Wyatt Hernandez PSA is actually going up a little bit, now 5.9.  I would like him to come back and have this rechecked with PSA prior in 3 months.  If it still going up at that time, may consider prostate MRI versus biopsy.  Please let him know and schedule the appropriate follow-up.  [name], MD

## 2021-05-31 NOTE — Telephone Encounter (Signed)
Patient informed, scheduled lab follow up.  Voiced understanding.

## 2021-06-01 DIAGNOSIS — M5410 Radiculopathy, site unspecified: Principal | ICD-10-CM

## 2021-06-01 DIAGNOSIS — R200 Anesthesia of skin: Principal | ICD-10-CM

## 2021-06-10 MED ORDER — LATANOPROST 0.005 % EYE DROPS
Freq: Every evening | OPHTHALMIC | 3 refills | 50 days | Status: CP
Start: 2021-06-10 — End: 2022-06-10

## 2021-07-01 ENCOUNTER — Ambulatory Visit: Admit: 2021-07-01 | Discharge: 2021-07-02 | Payer: MEDICARE

## 2021-07-13 ENCOUNTER — Ambulatory Visit
Admit: 2021-07-13 | Discharge: 2021-07-14 | Payer: MEDICARE | Attending: Physician Assistant | Primary: Physician Assistant

## 2021-07-13 DIAGNOSIS — Z9989 Dependence on other enabling machines and devices: Principal | ICD-10-CM

## 2021-08-01 MED ORDER — AMLODIPINE 5 MG TABLET
ORAL_TABLET | Freq: Every day | ORAL | 3 refills | 90 days | Status: CP
Start: 2021-08-01 — End: 2022-08-01

## 2021-09-02 ENCOUNTER — Other Ambulatory Visit: Payer: Medicare Other

## 2021-09-02 ENCOUNTER — Other Ambulatory Visit: Payer: Self-pay

## 2021-09-02 DIAGNOSIS — R972 Elevated prostate specific antigen [PSA]: Secondary | ICD-10-CM

## 2021-09-03 LAB — PSA: Prostate Specific Ag, Serum: 5.3 ng/mL — ABNORMAL HIGH (ref 0.0–4.0)

## 2021-09-06 ENCOUNTER — Telehealth: Payer: Self-pay | Admitting: *Deleted

## 2021-09-06 DIAGNOSIS — R972 Elevated prostate specific antigen [PSA]: Secondary | ICD-10-CM

## 2021-09-06 NOTE — Telephone Encounter (Addendum)
Patient informed, voiced understanding. Scheduled follow up ? ? ? ?----- Message from [name], MD sent at 09/06/2021  7:39 AM EST ----- ?PSA seems to be trending downwards.  Lets have him come back in 6 months, reassess his urinary symptoms repeat PSA and DRE. ? ?[name], MD ? ?

## 2021-09-07 MED ORDER — LATANOPROST 0.005 % EYE DROPS
Freq: Every evening | OPHTHALMIC | 0 refills | 50 days | Status: CP
Start: 2021-09-07 — End: 2022-09-07

## 2021-10-12 ENCOUNTER — Ambulatory Visit: Admit: 2021-10-12 | Discharge: 2021-10-13 | Payer: MEDICARE

## 2021-10-18 ENCOUNTER — Ambulatory Visit: Admit: 2021-10-18 | Discharge: 2021-10-19 | Payer: MEDICARE

## 2021-10-18 DIAGNOSIS — G4733 Obstructive sleep apnea (adult) (pediatric): Principal | ICD-10-CM

## 2021-10-18 DIAGNOSIS — I10 Essential (primary) hypertension: Principal | ICD-10-CM

## 2021-10-18 DIAGNOSIS — R3129 Other microscopic hematuria: Principal | ICD-10-CM

## 2021-10-18 DIAGNOSIS — N1832 Stage 3b chronic kidney disease (CMS-HCC): Principal | ICD-10-CM

## 2021-10-26 ENCOUNTER — Ambulatory Visit: Admit: 2021-10-26 | Discharge: 2021-10-27 | Payer: MEDICARE

## 2021-10-26 DIAGNOSIS — H402233 Chronic angle-closure glaucoma, bilateral, severe stage: Principal | ICD-10-CM

## 2021-11-12 ENCOUNTER — Ambulatory Visit
Admit: 2021-11-12 | Discharge: 2021-11-13 | Payer: MEDICARE | Attending: Physical Medicine & Rehabilitation | Primary: Physical Medicine & Rehabilitation

## 2021-11-15 ENCOUNTER — Ambulatory Visit: Admit: 2021-11-15 | Discharge: 2021-11-16 | Payer: MEDICARE

## 2021-11-15 ENCOUNTER — Ambulatory Visit
Admit: 2021-11-15 | Discharge: 2021-11-16 | Payer: MEDICARE | Attending: Student in an Organized Health Care Education/Training Program | Primary: Student in an Organized Health Care Education/Training Program

## 2021-11-15 DIAGNOSIS — Z6841 Body Mass Index (BMI) 40.0 and over, adult: Principal | ICD-10-CM

## 2021-11-15 DIAGNOSIS — M109 Gout, unspecified: Principal | ICD-10-CM

## 2021-11-15 MED ORDER — DICLOFENAC 1 % TOPICAL GEL
Freq: Four times a day (QID) | TOPICAL | 0 refills | 13 days | Status: CP
Start: 2021-11-15 — End: 2022-11-15

## 2021-11-15 MED ORDER — ALLOPURINOL 300 MG TABLET
ORAL_TABLET | Freq: Every day | ORAL | 5 refills | 30 days | Status: CP
Start: 2021-11-15 — End: 2022-11-15

## 2021-11-25 ENCOUNTER — Emergency Department: Payer: Medicare Other

## 2021-11-25 ENCOUNTER — Other Ambulatory Visit: Payer: Self-pay

## 2021-11-25 ENCOUNTER — Emergency Department
Admission: EM | Admit: 2021-11-25 | Discharge: 2021-11-25 | Disposition: A | Discharge status: Home / Self Care | Payer: Medicare Other | Attending: Student in an Organized Health Care Education/Training Program | Admitting: Student in an Organized Health Care Education/Training Program

## 2021-11-25 ENCOUNTER — Encounter: Payer: Self-pay | Admitting: Intensive Care

## 2021-11-25 DIAGNOSIS — M7989 Other specified soft tissue disorders: Secondary | ICD-10-CM

## 2021-11-25 DIAGNOSIS — I82411 Acute embolism and thrombosis of right femoral vein: Secondary | ICD-10-CM | POA: Insufficient documentation

## 2021-11-25 DIAGNOSIS — R2241 Localized swelling, mass and lump, right lower limb: Secondary | ICD-10-CM | POA: Diagnosis present

## 2021-11-25 LAB — COMPREHENSIVE METABOLIC PANEL
ALT: 19 U/L (ref 0–44)
AST: 23 U/L (ref 15–41)
Albumin: 3.6 g/dL (ref 3.5–5.0)
Alkaline Phosphatase: 55 U/L (ref 38–126)
Anion gap: 4 — ABNORMAL LOW (ref 5–15)
BUN: 27 mg/dL — ABNORMAL HIGH (ref 8–23)
CO2: 26 mmol/L (ref 22–32)
Calcium: 8.8 mg/dL — ABNORMAL LOW (ref 8.9–10.3)
Chloride: 104 mmol/L (ref 98–111)
Creatinine, Ser: 1.92 mg/dL — ABNORMAL HIGH (ref 0.61–1.24)
GFR, Estimated: 37 mL/min — ABNORMAL LOW (ref >60)
Glucose, Bld: 89 mg/dL (ref 70–99)
Potassium: 4.1 mmol/L (ref 3.5–5.1)
Sodium: 134 mmol/L — ABNORMAL LOW (ref 135–145)
Total Bilirubin: 0.8 mg/dL (ref 0.3–1.2)
Total Protein: 7.5 g/dL (ref 6.5–8.1)

## 2021-11-25 LAB — CBC
HCT: 37.7 % — ABNORMAL LOW (ref 39.0–52.0)
Hemoglobin: 11.6 g/dL — ABNORMAL LOW (ref 13.0–17.0)
MCH: 26.1 pg (ref 26.0–34.0)
MCHC: 30.8 g/dL (ref 30.0–36.0)
MCV: 84.9 fL (ref 80.0–100.0)
Platelets: 241 10*3/uL (ref 150–400)
RBC: 4.44 MIL/uL (ref 4.22–5.81)
RDW: 15.1 % (ref 11.5–15.5)
WBC: 5.6 10*3/uL (ref 4.0–10.5)
nRBC: 0 % (ref 0.0–0.2)

## 2021-11-25 MED ORDER — APIXABAN 5 MG PO TABS
10.0000 mg | ORAL_TABLET | Freq: Two times a day (BID) | ORAL | Status: DC
  Administered 2021-11-25: 10 mg via ORAL
  Filled 2021-11-25: qty 2

## 2021-11-25 MED ORDER — APIXABAN 5 MG PO TABS: 5.0000 mg | ORAL_TABLET | Freq: Two times a day (BID) | ORAL | Status: DC

## 2021-11-25 MED ORDER — APIXABAN (ELIQUIS) VTE STARTER PACK (10MG AND 5MG): ORAL_TABLET | ORAL | 0 refills | Status: AC

## 2021-11-25 MED ORDER — APIXABAN 5 MG PO TABS: 10.0000 mg | ORAL_TABLET | Freq: Two times a day (BID) | ORAL | Status: DC

## 2021-11-25 NOTE — ED Provider Notes (Signed)
[facility] Provider Note    Event Date/Time   First MD Initiated Contact with Patient 11/25/21 1611     (approximate)   History   Leg Swelling (right)   HPI  Wyatt Hernandez is a 71 y.o. male presents to the ER for evaluation of several days of worsening right lower extremity swelling and achiness.  No fevers denies any chest pain or shortness of breath.  Denies any abdominal pain.  No history of DVT PE.     Physical Exam   Triage Vital Signs: ED Triage Vitals [11/25/21 1605]  Enc Vitals Group     BP (!) 150/87     Pulse Rate 84     Resp 18     Temp 98.7 F (37.1 C)     Temp Source Oral     SpO2 97 %     Weight (!) 346 lb (156.9 kg)     Height 5' 4.25" (1.632 m)     Head Circumference      Peak Flow      Pain Score 9     Pain Loc      Pain Edu?      Excl. in GC?     Most recent vital signs: Vitals:   11/25/21 1605 11/25/21 1931  BP: (!) 150/87 126/80  Pulse: 84 72  Resp: 18 18  Temp: 98.7 F (37.1 C)   SpO2: 97% 99%     Constitutional: Alert  Eyes: Conjunctivae are normal.  Head: Atraumatic. Nose: No congestion/rhinnorhea. Mouth/Throat: Mucous membranes are moist.   Neck: Painless ROM.  Cardiovascular:   Good peripheral circulation. Respiratory: Normal respiratory effort.  No retractions.  Gastrointestinal: Soft and nontender.  Musculoskeletal:  rle swelling with pitting edema.  Pt and dp present.  No erythema or cellulitic changes Neurologic:  MAE spontaneously. No gross focal neurologic deficits are appreciated.  Skin:  Skin is warm, dry and intact. No rash noted. Psychiatric: Mood and affect are normal. Speech and behavior are normal.    ED Results / Procedures / Treatments   Labs (all labs ordered are listed, but only abnormal results are displayed) Labs Reviewed  CBC - Abnormal; Notable for the following components:      Result Value   Hemoglobin 11.6 (*)    HCT 37.7 (*)    All other components within  normal limits  COMPREHENSIVE METABOLIC PANEL - Abnormal; Notable for the following components:   Sodium 134 (*)    BUN 27 (*)    Creatinine, Ser 1.92 (*)    Calcium 8.8 (*)    GFR, Estimated 37 (*)    Anion gap 4 (*)    All other components within normal limits     EKG     RADIOLOGY Please see ED Course for my review and interpretation.  I personally reviewed all radiographic images ordered to evaluate for the above acute complaints and reviewed radiology reports and findings.  These findings were personally discussed with the patient.  Please see medical record for radiology report.    PROCEDURES:  Critical Care performed: No  Procedures   MEDICATIONS ORDERED IN ED: Medications  apixaban (ELIQUIS) tablet 10 mg (10 mg Oral Given 11/25/21 1935)    Followed by  apixaban (ELIQUIS) tablet 5 mg (has no administration in time range)     IMPRESSION / MDM / ASSESSMENT AND PLAN / ED COURSE  I reviewed the triage vital signs and the nursing notes.  Differential diagnosis includes, but is not limited to, DVT, cellulitis, fracture, CHF  Patient presented to ER for symptoms as described above presentation concerning for above differential.  Denies any chest pain or pressure or signs of PE.  This presenting complaint could reflect a potentially life-threatening illness therefore [place] and laboratory evaluation will be sent to evaluate for the above complaints.      Clinical Course as of 11/25/21 2337  Thu Nov 25, 2021  0211 Patient with evidence of DVT.  Given the clot burden and his swelling I did recommend hospitalization for heparinization and further monitoring.  Patient denies any chest pain or shortness of breath.  States he would prefer not to be a hospitalized and discussion of additional options trial of oral Eliquis patient prefer try outpatient management.  States that he is moving around quite well just has noted swelling and achiness.  He  denies any contraindications for anticoagulation.  We discussed the risks associated with anticoagulation patient consents to initiating treatment.  Will be given referral to vascular surgery clinic for follow-up. [PR]    Clinical Course User Index [PR] Willy Eddy, MD    Patient's presentation is most consistent with acute presentation with potential threat to life or bodily function.   FINAL CLINICAL IMPRESSION(S) / ED DIAGNOSES   Final diagnoses:  Acute deep vein thrombosis (DVT) of femoral vein of right lower extremity (HCC)  Leg swelling     Rx / DC Orders   ED Discharge Orders          Ordered    APIXABAN (ELIQUIS) VTE STARTER PACK (10MG  AND 5MG )        11/25/21 1904             Note:  This document was prepared using Dragon voice recognition software and may include unintentional dictation errors.    , MD 11/25/21 2337

## 2021-11-25 NOTE — ED Triage Notes (Signed)
Patient experiencing right leg swelling X3 weeks with pain and warm to touch.

## 2021-11-25 NOTE — ED Notes (Signed)
Xray done. Blood work sent to lab. No IV at this time

## 2021-11-25 NOTE — ED Notes (Signed)
Received report from John RN

## 2021-11-28 ENCOUNTER — Ambulatory Visit
Admit: 2021-11-28 | Discharge: 2021-11-28 | Disposition: A | Discharge status: Home / Self Care | Payer: MEDICARE | Attending: Emergency Medicine

## 2021-11-30 DIAGNOSIS — I82411 Acute embolism and thrombosis of right femoral vein: Principal | ICD-10-CM

## 2021-12-02 ENCOUNTER — Ambulatory Visit (INDEPENDENT_AMBULATORY_CARE_PROVIDER_SITE_OTHER): Payer: Self-pay | Admitting: Vascular Surgery

## 2021-12-07 ENCOUNTER — Ambulatory Visit: Admit: 2021-12-07 | Discharge: 2021-12-07 | Payer: MEDICARE

## 2021-12-07 DIAGNOSIS — M109 Gout, unspecified: Principal | ICD-10-CM

## 2021-12-07 DIAGNOSIS — N1832 Stage 3b chronic kidney disease (CMS-HCC): Principal | ICD-10-CM

## 2021-12-07 DIAGNOSIS — D649 Anemia, unspecified: Principal | ICD-10-CM

## 2021-12-07 DIAGNOSIS — I82411 Acute embolism and thrombosis of right femoral vein: Principal | ICD-10-CM

## 2021-12-07 MED ORDER — APIXABAN 5 MG TABLET
ORAL_TABLET | Freq: Two times a day (BID) | ORAL | 0 refills | 90 days | Status: CP
Start: 2021-12-07 — End: 2022-03-07

## 2021-12-13 ENCOUNTER — Ambulatory Visit: Admit: 2021-12-13 | Discharge: 2021-12-14 | Payer: MEDICARE | Attending: Family | Primary: Family

## 2021-12-13 DIAGNOSIS — Z9989 Dependence on other enabling machines and devices: Principal | ICD-10-CM

## 2021-12-13 DIAGNOSIS — Z6841 Body Mass Index (BMI) 40.0 and over, adult: Principal | ICD-10-CM

## 2021-12-13 DIAGNOSIS — G4733 Obstructive sleep apnea (adult) (pediatric): Principal | ICD-10-CM

## 2021-12-13 MED ORDER — LIRAGLUTIDE 0.6 MG/0.1 ML (18 MG/3 ML) SUBCUTANEOUS PEN INJECTOR
SUBCUTANEOUS | 0 refills | 42 days | Status: CP
Start: 2021-12-13 — End: 2022-01-24
  Filled 2021-12-20: qty 12, 42d supply, fill #0

## 2021-12-14 DIAGNOSIS — M109 Gout, unspecified: Principal | ICD-10-CM

## 2021-12-28 MED ORDER — PEN NEEDLE, DIABETIC 32 GAUGE X 5/32" (4 MM)
Freq: Every day | 3 refills | 50 days | Status: CP
Start: 2021-12-28 — End: ?

## 2021-12-30 MED ORDER — PEN NEEDLE, DIABETIC 32 GAUGE X 5/32" (4 MM)
Freq: Every day | 3 refills | 50 days | Status: CP
Start: 2021-12-30 — End: ?

## 2022-01-12 ENCOUNTER — Ambulatory Visit: Admit: 2022-01-12 | Discharge: 2022-01-13 | Payer: MEDICARE

## 2022-01-13 DIAGNOSIS — M109 Gout, unspecified: Principal | ICD-10-CM

## 2022-01-19 ENCOUNTER — Ambulatory Visit (LOCAL_COMMUNITY_HEALTH_CENTER): Payer: Medicare Other

## 2022-01-19 DIAGNOSIS — Z23 Encounter for immunization: Secondary | ICD-10-CM | POA: Diagnosis not present

## 2022-01-19 DIAGNOSIS — Z719 Counseling, unspecified: Secondary | ICD-10-CM

## 2022-01-19 NOTE — Progress Notes (Signed)
  Are you feeling sick today? No   Have you ever received a dose of COVID-19 Vaccine? [employer], [place], [place], [place], Other) Yes  If yes, which vaccine and how many doses?   MODERNA, 5   Did you bring the vaccination record card or other documentation?  Yes   Do you have a health condition or are undergoing treatment that makes you moderately or severely immunocompromised? This would include, but not be limited to: cancer, HIV, organ transplant, immunosuppressive therapy/high-dose corticosteroids, or moderate/severe primary immunodeficiency.  No  Have you received COVID-19 vaccine before or during hematopoietic cell transplant (HCT) or CAR-T-cell therapies? No  Have you ever had an allergic reaction to: (This would include a severe allergic reaction or a reaction that caused hives, swelling, or respiratory distress, including wheezing.) A component of a COVID-19 vaccine or a previous dose of COVID-19 vaccine? No   Have you ever had an allergic reaction to another vaccine (other thanCOVID-19 vaccine) or an injectable medication? (This would include a severe allergic reaction or a reaction that caused hives, swelling, or respiratory distress, including wheezing.)   No    Do you have a history of any of the following:  Myocarditis or Pericarditis No  Dermal fillers:  No  Multisystem Inflammatory Syndrome (MIS-C or MIS-A)? No  COVID-19 disease within the past 3 months? No  Vaccinated with monkeypox vaccine in the last 4 weeks? No  Pt came in for 2nd Moderna BV booster, meet criteria, administered vaccine to left deltoid, tolerated well. Gave pt an updated covid vaccination record card and [facility]. M.Jashaun Penrose, LPN.

## 2022-01-24 ENCOUNTER — Telehealth: Admit: 2022-01-24 | Discharge: 2022-01-25 | Payer: MEDICARE | Attending: Family | Primary: Family

## 2022-01-24 DIAGNOSIS — Z9989 Dependence on other enabling machines and devices: Principal | ICD-10-CM

## 2022-01-24 DIAGNOSIS — Z6841 Body Mass Index (BMI) 40.0 and over, adult: Principal | ICD-10-CM

## 2022-01-24 DIAGNOSIS — G4733 Obstructive sleep apnea (adult) (pediatric): Principal | ICD-10-CM

## 2022-01-24 MED ORDER — APIXABAN 5 MG TABLET
ORAL_TABLET | Freq: Two times a day (BID) | ORAL | 0 refills | 90 days | Status: CP
Start: 2022-01-24 — End: 2022-04-24

## 2022-01-24 MED ORDER — LIRAGLUTIDE 0.6 MG/0.1 ML (18 MG/3 ML) SUBCUTANEOUS PEN INJECTOR
Freq: Every day | SUBCUTANEOUS | 3 refills | 30 days | Status: CP
Start: 2022-01-24 — End: ?
  Filled 2022-01-25: qty 12, 40d supply, fill #0

## 2022-01-24 MED ORDER — LATANOPROST 0.005 % EYE DROPS
Freq: Every evening | OPHTHALMIC | 1 refills | 50 days | Status: CP
Start: 2022-01-24 — End: 2023-01-24
  Filled 2022-01-25: qty 2.5, 25d supply, fill #0

## 2022-01-24 MED ORDER — PEN NEEDLE, DIABETIC 32 GAUGE X 5/32" (4 MM)
Freq: Every day | 1 refills | 100 days | Status: CP
Start: 2022-01-24 — End: ?
  Filled 2022-03-07: qty 100, 100d supply, fill #0

## 2022-01-24 MED ORDER — DICLOFENAC 1 % TOPICAL GEL
Freq: Four times a day (QID) | TOPICAL | 0 refills | 13 days | Status: CP
Start: 2022-01-24 — End: 2023-01-24
  Filled 2022-01-25: qty 100, 13d supply, fill #0

## 2022-02-23 MED ORDER — ELIQUIS 5 MG TABLET
ORAL_TABLET | Freq: Two times a day (BID) | ORAL | 0 refills | 90 days | Status: CP
Start: 2022-02-23 — End: ?

## 2022-02-24 MED FILL — LATANOPROST 0.005 % EYE DROPS: OPHTHALMIC | 25 days supply | Qty: 2.5 | Fill #1

## 2022-03-03 ENCOUNTER — Other Ambulatory Visit: Payer: Medicare Other

## 2022-03-03 DIAGNOSIS — R972 Elevated prostate specific antigen [PSA]: Secondary | ICD-10-CM

## 2022-03-04 LAB — PSA: Prostate Specific Ag, Serum: 6.3 ng/mL — ABNORMAL HIGH (ref 0.0–4.0)

## 2022-03-08 ENCOUNTER — Encounter: Payer: Self-pay | Admitting: Urology

## 2022-03-08 ENCOUNTER — Ambulatory Visit: Payer: Medicare Other | Admitting: Urology

## 2022-03-08 VITALS — BP 149/89 | HR 92 | Ht 64.25 in | Wt 343.0 lb

## 2022-03-08 DIAGNOSIS — R972 Elevated prostate specific antigen [PSA]: Secondary | ICD-10-CM

## 2022-03-08 NOTE — Progress Notes (Signed)
03/08/2022 11:13 AM   Wyatt Hernandez [birth year] [id number]  Referring provider: Leanna Sato, MD [address] [place],  [place] [address]  Chief Complaint  Patient presents with   Elevated PSA    HPI: 71 year old male with a personal history of elevated PSA returns today for follow-up.  He was initially seen and evaluated for elevated PSA back in November 2022.  At that time, his PSA was 5.9.  We elected to repeat the PSA and seem to be trending back downwards to 5.3.  Unfortunately however, this back up to 6.3 as of 03/03/2022.  He mentions today that he was diagnosed with a blood clot in his right leg.  He is now on Eliquis.  He also has additional multiple medical comorbidities as outlined below.  He does have baseline urinary symptoms primarily frequency and urgency related to his diuretics.  This is at baseline.  No new symptoms.  Component     Latest Ref Rng 05/25/2021 09/02/2021 03/03/2022  Prostate Specific Ag, Serum     0.0 - 4.0 ng/mL 5.9 (H)  5.3 (H)  6.3 (H)     Legend: (H) High  PMH: Past Medical History:  Diagnosis Date   Benign hypertensive heart and CKD, 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), w CHF (HCC)    CHF (congestive heart failure) (HCC)    Gout    Hypertension    Renal disorder    Sleep apnea    Vitamin D deficiency     Surgical History: Past Surgical History:  Procedure Laterality Date   COLONOSCOPY WITH PROPOFOL N/A 02/03/2017   Procedure: COLONOSCOPY WITH PROPOFOL;  Surgeon: Christena Deem, MD;  [facility] ENDOSCOPY;  Service: Endoscopy;  Laterality: N/A;   ESOPHAGOGASTRODUODENOSCOPY (EGD) WITH PROPOFOL N/A 02/03/2017   Procedure: ESOPHAGOGASTRODUODENOSCOPY (EGD) WITH PROPOFOL;  Surgeon: Christena Deem, MD;  Location: [facility] ENDOSCOPY;  Service: Endoscopy;  Laterality: N/A;   HERNIA REPAIR     TYMPANOPLASTY Left     Home Medications:  Allergies as of 03/08/2022       Reactions   Shellfish Allergy Shortness Of Breath, Nausea And Vomiting,  Swelling        Medication List        Accurate as of March 08, 2022 11:13 AM. If you have any questions, ask your nurse or doctor.          amLODipine 10 MG tablet Commonly known as: NORVASC Take 10 mg by mouth daily.   Apixaban Starter Pack (10mg  and 5mg ) Commonly known as: ELIQUIS STARTER PACK Take as directed on package: start with two-5mg  tablets twice daily for 7 days. On day 8, switch to one-5mg  tablet twice daily.   cyanocobalamin 50 MCG tablet Take 1 tablet by mouth daily.   fluticasone 50 MCG/ACT nasal spray Commonly known as: FLONASE Place into both nostrils daily.   hydrochlorothiazide 12.5 MG capsule Commonly known as: MICROZIDE Take 12.5 mg by mouth daily.   lisinopril 40 MG tablet Commonly known as: ZESTRIL Take 40 mg by mouth daily.   metoprolol tartrate 50 MG tablet Commonly known as: LOPRESSOR Take 1 tablet (50 mg total) by mouth 2 (two) times daily.   oxyCODONE-acetaminophen 5-325 MG tablet Commonly known as: PERCOCET/ROXICET Take 1 tablet by mouth daily.   ranitidine 150 MG tablet Commonly known as: ZANTAC Take 150 mg by mouth 2 (two) times daily.   tadalafil 20 MG tablet Commonly known as: CIALIS Take 20 mg by mouth daily as needed for erectile dysfunction.  tamsulosin 0.4 MG Caps capsule Commonly known as: FLOMAX Take 0.4 mg by mouth daily.   Vitamin D (Cholecalciferol) 10 MCG (400 UNIT) Tabs Take 1 tablet by mouth daily.        Allergies:  Allergies  Allergen Reactions   Shellfish Allergy Shortness Of Breath, Nausea And Vomiting and Swelling    Family History: Family History  Problem Relation Age of Onset   Heart failure Father    Heart attack Father 62   Hyperlipidemia Mother    Hypertension Mother    Heart failure Sister    Heart attack Sister    Heart attack Brother 54   Heart attack Brother 64    Social History:  reports that he has never smoked. He has never used smokeless tobacco. He reports current  alcohol use. He reports current drug use. Drug: Oxycodone.   Physical Exam: BP (!) 149/89   Pulse 92   Ht 5' 4.25" (1.632 m)   Wt (!) 343 lb (155.6 kg)   BMI 58.42 kg/m   Constitutional:  Alert and oriented, No acute distress. HEENT: [place] AT, moist mucus membranes.  Trachea midline, no masses. Cardiovascular: Bilateral lower extremity edema Respiratory: Normal respiratory effort, no increased work of breathing. Rectal exam deferred today based on previous limitations with habitus Skin: No rashes, bruises or suspicious lesions. Neurologic: Grossly intact, no focal deficits, moving all 4 extremities. Psychiatric: Normal mood and affect.  Laboratory Data: Lab Results  Component Value Date   WBC 5.6 11/25/2021   HGB 11.6 (L) 11/25/2021   HCT 37.7 (L) 11/25/2021   MCV 84.9 11/25/2021   PLT 241 11/25/2021    Lab Results  Component Value Date   CREATININE 1.92 (H) 11/25/2021    Lab Results  Component Value Date   HGBA1C 5.6 08/15/2016   Assessment & Plan:    1. Elevated PSA PSA is trending back upwards within the same similar range  Based on his comorbidities, as well as newly diagnosed DVT on Eliquis, prefer to avoid aggressive diagnostic evaluation and intervention based on multiple factors including life expectancy and her risk of complications.  We did discuss the option for MRI.  We discussed the sensitivity and specificity of this study.  We discussed depending on the results, if there is a high-grade PI-RADS 4 or 5 lesion, may consider the option of fusion biopsy versus very close surveillance.  For low-grade lesions, would not recommend biopsy.  He is agreeable this plan.  Tentatively plan to call him with his MRI results and follow-up in 6 months with PSA.  He is agreeable this plan.  We will adjust plan pending MRI results.   Return in about 6 months (around 09/06/2022) for PSA.  [name], MD  [facility] [address], [address] [place], [place] [address] [phone]

## 2022-03-10 ENCOUNTER — Ambulatory Visit: Admit: 2022-03-10 | Discharge: 2022-03-11 | Payer: MEDICARE

## 2022-03-15 ENCOUNTER — Ambulatory Visit: Admit: 2022-03-15 | Discharge: 2022-03-16 | Payer: MEDICARE

## 2022-03-15 DIAGNOSIS — H402233 Chronic angle-closure glaucoma, bilateral, severe stage: Principal | ICD-10-CM

## 2022-03-15 DIAGNOSIS — H43393 Other vitreous opacities, bilateral: Principal | ICD-10-CM

## 2022-03-21 ENCOUNTER — Ambulatory Visit: Admit: 2022-03-21 | Discharge: 2022-03-22 | Payer: MEDICARE

## 2022-03-21 DIAGNOSIS — D649 Anemia, unspecified: Principal | ICD-10-CM

## 2022-03-21 DIAGNOSIS — Z7901 Long term (current) use of anticoagulants: Principal | ICD-10-CM

## 2022-03-21 MED ORDER — APIXABAN 5 MG TABLET
ORAL_TABLET | Freq: Two times a day (BID) | ORAL | 3 refills | 90 days | Status: CP
Start: 2022-03-21 — End: 2023-03-21

## 2022-03-28 MED ORDER — DICLOFENAC 1 % TOPICAL GEL
Freq: Four times a day (QID) | TOPICAL | 0 refills | 13 days | Status: CP
Start: 2022-03-28 — End: 2023-03-28

## 2022-03-29 MED FILL — VICTOZA 2-PAK 0.6 MG/0.1 ML (18 MG/3 ML) SUBCUTANEOUS PEN INJECTOR: SUBCUTANEOUS | 40 days supply | Qty: 12 | Fill #1

## 2022-04-08 DIAGNOSIS — I10 Essential (primary) hypertension: Principal | ICD-10-CM

## 2022-04-14 DIAGNOSIS — M109 Gout, unspecified: Principal | ICD-10-CM

## 2022-04-26 ENCOUNTER — Ambulatory Visit: Admit: 2022-04-26 | Payer: MEDICARE | Attending: Family | Primary: Family

## 2022-04-28 ENCOUNTER — Ambulatory Visit: Admit: 2022-04-28 | Discharge: 2022-04-29 | Payer: MEDICARE

## 2022-04-28 MED ORDER — COLCHICINE (GOUT) 0.6 MG TABLET
ORAL_TABLET | 2 refills | 0 days | Status: CP
Start: 2022-04-28 — End: ?

## 2022-04-28 MED ORDER — ALLOPURINOL 300 MG TABLET
ORAL_TABLET | Freq: Every day | ORAL | 3 refills | 90 days | Status: CP
Start: 2022-04-28 — End: 2023-04-28

## 2022-05-02 ENCOUNTER — Encounter (INDEPENDENT_AMBULATORY_CARE_PROVIDER_SITE_OTHER): Payer: Self-pay

## 2022-05-03 MED FILL — VICTOZA 2-PAK 0.6 MG/0.1 ML (18 MG/3 ML) SUBCUTANEOUS PEN INJECTOR: SUBCUTANEOUS | 40 days supply | Qty: 12 | Fill #2

## 2022-05-05 ENCOUNTER — Ambulatory Visit: Admit: 2022-05-05 | Discharge: 2022-05-06 | Payer: MEDICARE

## 2022-05-05 DIAGNOSIS — G4733 Obstructive sleep apnea (adult) (pediatric): Principal | ICD-10-CM

## 2022-05-05 DIAGNOSIS — R3129 Other microscopic hematuria: Principal | ICD-10-CM

## 2022-05-05 DIAGNOSIS — N1832 Stage 3b chronic kidney disease (CMS-HCC): Principal | ICD-10-CM

## 2022-05-05 DIAGNOSIS — I10 Essential (primary) hypertension: Principal | ICD-10-CM

## 2022-05-10 ENCOUNTER — Telehealth
Admit: 2022-05-10 | Discharge: 2022-05-11 | Payer: MEDICARE | Attending: Student in an Organized Health Care Education/Training Program | Primary: Student in an Organized Health Care Education/Training Program

## 2022-05-10 DIAGNOSIS — Z6841 Body Mass Index (BMI) 40.0 and over, adult: Principal | ICD-10-CM

## 2022-05-10 MED ORDER — LIRAGLUTIDE (WEIGHT LOSS) 3 MG/0.5 ML (18 MG/3 ML) SUBCUT PEN INJECTOR
SUBCUTANEOUS | 3 refills | 14 days | Status: CP
Start: 2022-05-10 — End: 2022-05-24

## 2022-05-12 MED ORDER — DICLOFENAC 1 % TOPICAL GEL
Freq: Four times a day (QID) | TOPICAL | 0 refills | 13 days | Status: CP
Start: 2022-05-12 — End: 2023-05-12

## 2022-05-13 MED ORDER — LATANOPROST 0.005 % EYE DROPS
Freq: Every evening | OPHTHALMIC | 6 refills | 100 days | Status: CP
Start: 2022-05-13 — End: ?

## 2022-05-17 DIAGNOSIS — Z6841 Body Mass Index (BMI) 40.0 and over, adult: Principal | ICD-10-CM

## 2022-05-17 MED ORDER — LIRAGLUTIDE (WEIGHT LOSS) 3 MG/0.5 ML (18 MG/3 ML) SUBCUT PEN INJECTOR
SUBCUTANEOUS | 3 refills | 14 days | Status: CP
Start: 2022-05-17 — End: 2022-05-31

## 2022-06-03 ENCOUNTER — Ambulatory Visit: Admit: 2022-06-03 | Discharge: 2022-06-04 | Payer: MEDICARE

## 2022-06-03 DIAGNOSIS — M1712 Unilateral primary osteoarthritis, left knee: Principal | ICD-10-CM

## 2022-06-21 ENCOUNTER — Ambulatory Visit
Admit: 2022-06-21 | Payer: MEDICARE | Attending: Rehabilitative and Restorative Service Providers" | Primary: Rehabilitative and Restorative Service Providers"

## 2022-06-30 ENCOUNTER — Telehealth: Admit: 2022-06-30 | Discharge: 2022-07-01 | Payer: MEDICARE | Attending: Registered" | Primary: Registered"

## 2022-06-30 DIAGNOSIS — Z6841 Body Mass Index (BMI) 40.0 and over, adult: Principal | ICD-10-CM

## 2022-07-11 ENCOUNTER — Encounter: Payer: Self-pay | Admitting: Physical Therapy

## 2022-07-11 ENCOUNTER — Ambulatory Visit: Payer: Medicare Other | Attending: Orthopedic Surgery | Admitting: Physical Therapy

## 2022-07-11 DIAGNOSIS — R269 Unspecified abnormalities of gait and mobility: Secondary | ICD-10-CM | POA: Diagnosis present

## 2022-07-11 DIAGNOSIS — M25662 Stiffness of left knee, not elsewhere classified: Secondary | ICD-10-CM | POA: Diagnosis present

## 2022-07-11 DIAGNOSIS — M25562 Pain in left knee: Secondary | ICD-10-CM | POA: Diagnosis present

## 2022-07-11 DIAGNOSIS — M6281 Muscle weakness (generalized): Secondary | ICD-10-CM | POA: Diagnosis present

## 2022-07-11 DIAGNOSIS — G8929 Other chronic pain: Secondary | ICD-10-CM | POA: Insufficient documentation

## 2022-07-11 NOTE — Therapy (Signed)
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), w CHF (HCC)    CHF (congestive heart failure) ([place])    Gout    Hypertension    Renal disorder    Sleep apnea    Vitamin D deficiency    Past Surgical History:  Procedure Laterality Date   COLONOSCOPY WITH PROPOFOL N/A 02/03/2017   Procedure: COLONOSCOPY WITH PROPOFOL;  Surgeon: Lollie Sails, MD;  Location: [facility] ENDOSCOPY;  Service: Endoscopy;  Laterality: N/A;   ESOPHAGOGASTRODUODENOSCOPY (EGD) WITH PROPOFOL N/A 02/03/2017   Procedure: ESOPHAGOGASTRODUODENOSCOPY (EGD) WITH PROPOFOL;  Surgeon: Lollie Sails, MD;  Location: [facility] ENDOSCOPY;  Service: Endoscopy;  Laterality: N/A;   HERNIA REPAIR     TYMPANOPLASTY Left    Patient Active Problem List   Diagnosis Date Noted   Opiate overdose ([place])    Narcotic overdose ([place]) 03/29/2017   Coronary artery disease involving native coronary artery of native heart without angina pectoris 02/01/2017   Shortness of breath 09/01/2016   (HFpEF) heart failure with preserved ejection fraction ([place]) 09/01/2016   Hypertension 09/01/2016   Mixed hyperlipidemia 09/01/2016   Chest pain 08/15/2016    PCP: Marguerita Merles, MD  REFERRING PROVIDER: Gibson Desanctis, MD  REFERRING DIAG: Unilateral primary osteoarthritis, left knee  THERAPY DIAG:  Chronic pain of left knee  Muscle weakness (generalized)  Gait difficulty  Joint stiffness of knee, left  Rationale for Evaluation and Treatment: Rehabilitation  ONSET DATE: 3 years ago.   Chronic  SUBJECTIVE:   SUBJECTIVE STATEMENT: Pt. Entered PT with c/o 8/10 L knee pain while using RW.  Pt. Reports he has to lose weight and is doing a 2,000 calorie/ day diet.  Does not believe in wt. Loss surgery.  Pt. Reports B ankles/ L knee is "bone on bone".  Pt. Wears heavy duty B ankle braces.  Goal weight 200# and is currently 338#.  Pt. unable to golf.    PERTINENT HISTORY: Pt. Had synovial injection with minimal benefit.  See MD note [facility] care everywhere).  Pt. Discussed cellulitis in R knee/lower leg while going to [employer] (hospitalized).  PAIN:  Are you having pain? Yes: NPRS scale: 8/10 Pain location: L medial knee Pain description: sharp pain Aggravating factors: increase activity/ walking Relieving factors: Meds (minimal benefit from opioids)  PRECAUTIONS: Fall  WEIGHT BEARING RESTRICTIONS: No  FALLS:  Has patient fallen in last 6 months? Yes. Number of falls 2x L knee buckle (able to return to stand independently).    LIVING ENVIRONMENT: Lives with: lives with their spouse Lives in: House/apartment Stairs: No Has following equipment at home: Gilford Rile - 2 wheeled  OCCUPATION: Retired for 12 years.    PLOF: Requires assistive device for independence  Has RW and SPC.  Has compression stockings/ ankle braces.  Occasionally uses rollator and sits on rollator.    PATIENT GOALS:  Increase mobility/ weight loss to be able to have L  TKA.   NEXT MD VISIT:  June 2024  OBJECTIVE:   DIAGNOSTIC FINDINGS: See MD notes  PATIENT SURVEYS:  FOTO initial 35/ goal 8  COGNITION: Overall cognitive status: Within functional limits for tasks assessed     SENSATION: Not tested  Pt. Has heavy duty custom ankle braces on.  Pt. Will not wear next tx.    EDEMA:  Circumferential: TBD next tx.  MUSCLE LENGTH: Hamstrings: Right 38 deg; Left 35 deg  POSTURE: rounded shoulders  PALPATION: Significant L medial knee tenderness/ no tenderness on lateral aspect of knee.       LOWER EXTREMITY ROM:  R knee 0 to 104 deg.  L knee 0 to 102 deg.  B LE are in resting ER position while lying on mat table.  LBP with hamstring stretches.    LOWER EXTREMITY MMT:   R quad/ hamstring 5/5 MMT.  L quad 5/5, hamstring 4/5 (pain).  Hip flexion 5/5 MMT.    LOWER EXTREMITY SPECIAL TESTS:  Knee special test: difficulty assessing.    FUNCTIONAL TESTS:  5 times sit to stand: TBD  GAIT: Distance walked: In clinic/ hallway Assistive device utilized: Walker - 2 wheeled Level of assistance: Modified independence Comments: Pt. Able to ambulate with moderate L antalgic gait pattern and use of RW.  Pt. Ambulates <10 feet with no assistive device and significant L knee pain/ antalgia.  Increase fall risk.     TODAY'S TREATMENT:                                                                                                                              DATE: 07/11/22   Evaluation.  Will check ankle ROM/ swelling/ sensation next visit.    PATIENT EDUCATION:  Education details: Gait with RW Person educated: Patient Education method: Medical illustrator Education comprehension: verbalized understanding and returned demonstration  HOME EXERCISE PROGRAM: TBD  ASSESSMENT:  CLINICAL IMPRESSION: Patient is a pleasant 72 y.o. male who was seen today for physical therapy evaluation and treatment for L knee pain/ gait difficulty/ generalized deconditioning.  Pt. Presents with significant L knee tenderness/ pain over medial aspect, esp with walking without assistive device.  B knee joint stiffness and LE muscle weakness. Pt. Will benefit from a consistent HEP to increase LE strength/ standing tolerance to improve functional mobility/ prepare for L TKA.    OBJECTIVE IMPAIRMENTS: Abnormal gait, decreased activity tolerance, decreased balance, decreased endurance, decreased mobility, difficulty walking, decreased ROM, decreased strength, hypomobility, increased edema, impaired  flexibility, improper body mechanics, postural dysfunction, obesity, and pain.   ACTIVITY LIMITATIONS: carrying, lifting, bending, standing, squatting, stairs, transfers, and locomotion level  PARTICIPATION LIMITATIONS: cleaning, driving, shopping, and community activity  PERSONAL FACTORS: Fitness and Past/current experiences are also affecting patient's functional outcome.   REHAB POTENTIAL: Fair obesity/ B ankle joint pain/ limitations.   CLINICAL DECISION MAKING: Evolving/moderate complexity  EVALUATION COMPLEXITY: Moderate   GOALS: Goals reviewed with patient? Yes  SHORT  TERM GOALS: Target date: 08/08/22 Pt. Will be independent with HEP to increase B hip/LE muscle strength to 5/5 MMT to improve standing tolerance/ mobility.  Baseline:  See above Goal status: INITIAL   LONG TERM GOALS: Target date: 10/03/22  Pt. Will increase FOTO to 49 to improve functional mobility.   Baseline: initial 35 Goal status: INITIAL  2.  Pt. Able to complete 30 minutes of there.ex. with no increase c/o L medial knee pain.   Baseline:  pt. Currently not exercising/ limited mobility.   Goal status: INITIAL  3.  Pt. Will ambulate 300 feet with least assistive device and no increase c/o L knee pain to improve pain-free mobility.   Baseline:  see above Goal status: INITIAL  PLAN:  PT FREQUENCY: 2x/week  PT DURATION: 12 weeks  PLANNED INTERVENTIONS: Therapeutic exercises, Therapeutic activity, Neuromuscular re-education, Balance training, Gait training, Patient/Family education, Self Care, Joint mobilization, Stair training, DME instructions, Cryotherapy, Moist heat, Manual therapy, and Re-evaluation  PLAN FOR NEXT SESSION: Issue HEP/ check swelling/ sensation  Cammie Mcgee, PT, DPT # [phone] 07/11/2022, 3:52 PM

## 2022-07-13 ENCOUNTER — Ambulatory Visit: Payer: Medicare Other | Admitting: Physical Therapy

## 2022-07-13 ENCOUNTER — Encounter: Payer: Self-pay | Admitting: Physical Therapy

## 2022-07-13 ENCOUNTER — Ambulatory Visit
Admit: 2022-07-13 | Discharge: 2022-07-14 | Payer: MEDICARE | Attending: Physician Assistant | Primary: Physician Assistant

## 2022-07-13 DIAGNOSIS — M25662 Stiffness of left knee, not elsewhere classified: Secondary | ICD-10-CM

## 2022-07-13 DIAGNOSIS — G8929 Other chronic pain: Secondary | ICD-10-CM

## 2022-07-13 DIAGNOSIS — M6281 Muscle weakness (generalized): Secondary | ICD-10-CM

## 2022-07-13 DIAGNOSIS — R269 Unspecified abnormalities of gait and mobility: Secondary | ICD-10-CM

## 2022-07-13 DIAGNOSIS — M25562 Pain in left knee: Secondary | ICD-10-CM | POA: Diagnosis not present

## 2022-07-13 DIAGNOSIS — Z6841 Body Mass Index (BMI) 40.0 and over, adult: Principal | ICD-10-CM

## 2022-07-13 NOTE — Therapy (Signed)
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), w CHF (HCC)    CHF (congestive heart failure) ([place])    Gout    Hypertension    Renal disorder    Sleep apnea    Vitamin D deficiency    Past Surgical History:  Procedure Laterality Date   COLONOSCOPY WITH PROPOFOL N/A 02/03/2017   Procedure: COLONOSCOPY WITH PROPOFOL;  Surgeon: Lollie Sails, MD;  Location: [facility] ENDOSCOPY;  Service: Endoscopy;  Laterality: N/A;   ESOPHAGOGASTRODUODENOSCOPY (EGD) WITH PROPOFOL N/A 02/03/2017   Procedure: ESOPHAGOGASTRODUODENOSCOPY (EGD) WITH PROPOFOL;  Surgeon: Lollie Sails, MD;  Location: [facility] ENDOSCOPY;  Service: Endoscopy;  Laterality: N/A;   HERNIA REPAIR     TYMPANOPLASTY Left    Patient Active Problem List   Diagnosis Date Noted   Opiate overdose ([place])    Narcotic overdose ([place]) 03/29/2017   Coronary artery disease involving native coronary artery of native heart without angina pectoris 02/01/2017   Shortness of breath 09/01/2016   (HFpEF) heart failure with preserved ejection fraction ([place]) 09/01/2016   Hypertension 09/01/2016   Mixed hyperlipidemia 09/01/2016   Chest pain 08/15/2016    PCP: Marguerita Merles, MD  REFERRING PROVIDER: Meno Desanctis, MD  REFERRING DIAG: Unilateral primary osteoarthritis, left knee  THERAPY DIAG:  Chronic pain of left knee  Muscle weakness (generalized)  Gait difficulty  Joint stiffness of knee, left  Rationale for Evaluation and Treatment: Rehabilitation  ONSET DATE: 3 years ago.   Chronic  SUBJECTIVE:   SUBJECTIVE STATEMENT: Pt. Entered PT with c/o 8/10 L knee pain while using RW.  Pt. Reports he has to lose weight and is doing a 2,000 calorie/ day diet.  Does not believe in wt. Loss surgery.  Pt. Reports B ankles/ L knee is "bone on bone".  Pt. Wears heavy duty B ankle braces.  Goal weight 200# and is currently 338#.  Pt. unable to golf.    PERTINENT HISTORY: Pt. Had synovial injection with minimal benefit.  See MD note [facility] care everywhere).  Pt. Discussed cellulitis in R knee/lower leg while going to [employer] (hospitalized).  PAIN:  Are you having pain? Yes: NPRS scale: 8/10 Pain location: L medial knee Pain description: sharp pain Aggravating factors: increase activity/ walking Relieving factors: Meds (minimal benefit from opioids)  PRECAUTIONS: Fall  WEIGHT BEARING RESTRICTIONS: No  FALLS:  Has patient fallen in last 6 months? Yes. Number of falls 2x L knee buckle (able to return to stand independently).    LIVING ENVIRONMENT: Lives with: lives with their spouse Lives in: House/apartment Stairs: No Has following equipment at home: Gilford Rile - 2 wheeled  OCCUPATION: Retired for 12 years.    PLOF: Requires assistive device for independence  Has RW and SPC.  Has compression stockings/ ankle braces.  Occasionally uses rollator and sits on rollator.    PATIENT GOALS:  Increase mobility/ weight loss to be able to have L TKA.  NEXT MD VISIT:  June 2024  OBJECTIVE:   DIAGNOSTIC FINDINGS: See MD notes  PATIENT SURVEYS:  FOTO initial 35/ goal 58  COGNITION: Overall cognitive status: Within functional limits for tasks assessed     SENSATION: Not tested  Pt. Has heavy duty custom ankle braces on.  Pt. Will not wear next tx.    EDEMA:  Circumferential: TBD next tx.  MUSCLE LENGTH: Hamstrings: Right 38 deg; Left 35 deg  POSTURE: rounded shoulders  PALPATION: Significant L medial knee tenderness/ no tenderness on lateral aspect of knee.       LOWER EXTREMITY ROM:  R knee 0 to 104 deg.  L knee 0 to 102 deg.  B LE are in resting ER position while lying on mat table.  LBP with hamstring stretches.    LOWER EXTREMITY MMT:   R quad/ hamstring 5/5 MMT.  L quad 5/5, hamstring 4/5 (pain).  Hip flexion 5/5 MMT.    LOWER EXTREMITY SPECIAL TESTS:  Knee special test: difficulty assessing.    FUNCTIONAL TESTS:  5 times sit to stand: TBD  GAIT: Distance walked: In clinic/ hallway Assistive device utilized: Walker - 2 wheeled Level of assistance: Modified independence Comments: Pt. Able to ambulate with moderate L antalgic gait pattern and use of RW.  Pt. Ambulates <10 feet with no assistive device and significant L knee pain/ antalgia.  Increase fall risk.    07/13/22:  L/R: knee joint line: 52 cm/ 54 cm.  Mid-gastroc (11 cm from inferior patella): 46 cm./ 49 cm.  Distal quad. (5 cm superior from patella): 54.75 cm/ 55 cm.     TODAY'S TREATMENT:                                                                                                                              DATE: 07/13/22   Subjective: Pt. Reports 9/10 L knee pain while walking into PT clinic.   There.ex.:   Nustep L2 7.5 min B UE/LE.  Pt. Reports 10/10 L knee pain  Walking in //-bars (forward/ lateral) 2 laps each with light UE assist.  Increase antalgic gait without UE assist.    See new HEP (seated/ standing ex.)- see below.    Assessment of B LE swelling (see above).  Supine heel slides 10x2 L/R   PATIENT EDUCATION:  Education details: Gait with RW.  Access Code: [email or web address] Person educated: Patient Education method: Explanation and Demonstration Education comprehension: verbalized understanding and returned demonstration  HOME EXERCISE PROGRAM: Access Code: [email or web address] URL: [email or web address] Date: 07/13/2022 Prepared by: Dorcas Carrow  Exercises - Seated Long Arc Quad  - 2 x daily - 7 x weekly - 2 sets - 10 reps - Seated Heel Toe  Raises  - 2 x daily - 7 x weekly - 2 sets - 10 reps - Standing Hip Abduction with Counter Support  - 2 x daily - 7 x weekly - 2 sets - 10 reps - Standing Hip Extension  with Counter Support  - 2 x daily - 7 x weekly - 2 sets - 10 reps - Standing March with Counter Support  - 2 x daily - 7 x weekly - 2 sets - 10 reps  ASSESSMENT:  CLINICAL IMPRESSION: Pt. Presents to clinic with significant L knee pain and tenderness over medial aspect, esp with walking without assistive device.  Pt. Walking in clinic today without ankle braces.  Good B DF/PF AROM noted while in seated position.  Pt. Requires UE assist in //-bars to improve gait pattern/ decrease L knee discomfort.  See updated HEP.  Pt. Will benefit from a consistent HEP to increase LE strength/ standing tolerance to improve functional mobility/ prepare for L TKA.    OBJECTIVE IMPAIRMENTS: Abnormal gait, decreased activity tolerance, decreased balance, decreased endurance, decreased mobility, difficulty walking, decreased ROM, decreased strength, hypomobility, increased edema, impaired flexibility, improper body mechanics, postural dysfunction, obesity, and pain.   ACTIVITY LIMITATIONS: carrying, lifting, bending, standing, squatting, stairs, transfers, and locomotion level  PARTICIPATION LIMITATIONS: cleaning, driving, shopping, and community activity  PERSONAL FACTORS: Fitness and Past/current experiences are also affecting patient's functional outcome.   REHAB POTENTIAL: Fair obesity/ B ankle joint pain/ limitations.   CLINICAL DECISION MAKING: Evolving/moderate complexity  EVALUATION COMPLEXITY: Moderate   GOALS: Goals reviewed with patient? Yes  SHORT TERM GOALS: Target date: 08/08/22 Pt. Will be independent with HEP to increase B hip/LE muscle strength to 5/5 MMT to improve standing tolerance/ mobility.  Baseline:  See above Goal status: INITIAL   LONG TERM GOALS: Target date: 10/03/22  Pt. Will increase FOTO to 49 to improve  functional mobility.   Baseline: initial 35 Goal status: INITIAL  2.  Pt. Able to complete 30 minutes of there.ex. with no increase c/o L medial knee pain.   Baseline:  pt. Currently not exercising/ limited mobility.   Goal status: INITIAL  3.  Pt. Will ambulate 300 feet with least assistive device and no increase c/o L knee pain to improve pain-free mobility.   Baseline:  see above Goal status: INITIAL  PLAN:  PT FREQUENCY: 2x/week  PT DURATION: 12 weeks  PLANNED INTERVENTIONS: Therapeutic exercises, Therapeutic activity, Neuromuscular re-education, Balance training, Gait training, Patient/Family education, Self Care, Joint mobilization, Stair training, DME instructions, Cryotherapy, Moist heat, Manual therapy, and Re-evaluation  PLAN FOR NEXT SESSION:  Progress standing there.ex./ walking endurance.    Cammie Mcgee, PT, DPT # [id number] 07/13/2022, 1:11 PM

## 2022-07-14 MED ORDER — LIRAGLUTIDE 0.6 MG/0.1 ML (18 MG/3 ML) SUBCUTANEOUS PEN INJECTOR
Freq: Every day | SUBCUTANEOUS | 3 refills | 40 days | Status: CP
Start: 2022-07-14 — End: ?

## 2022-07-18 ENCOUNTER — Ambulatory Visit: Payer: Medicare Other | Admitting: Physical Therapy

## 2022-07-18 DIAGNOSIS — M25662 Stiffness of left knee, not elsewhere classified: Secondary | ICD-10-CM

## 2022-07-18 DIAGNOSIS — M25562 Pain in left knee: Secondary | ICD-10-CM | POA: Diagnosis not present

## 2022-07-18 DIAGNOSIS — M6281 Muscle weakness (generalized): Secondary | ICD-10-CM

## 2022-07-18 DIAGNOSIS — R269 Unspecified abnormalities of gait and mobility: Secondary | ICD-10-CM

## 2022-07-18 DIAGNOSIS — G8929 Other chronic pain: Secondary | ICD-10-CM

## 2022-07-18 NOTE — Therapy (Signed)
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), w CHF (HCC)    CHF (congestive heart failure) (HCC)    Gout    Hypertension    Renal disorder    Sleep apnea    Vitamin D deficiency    Past Surgical History:  Procedure Laterality Date   COLONOSCOPY WITH PROPOFOL N/A 02/03/2017   Procedure: COLONOSCOPY WITH PROPOFOL;  Surgeon: Christena Deem, MD;  Location: [facility] ENDOSCOPY;  Service: Endoscopy;  Laterality: N/A;   ESOPHAGOGASTRODUODENOSCOPY (EGD) WITH PROPOFOL N/A 02/03/2017   Procedure: ESOPHAGOGASTRODUODENOSCOPY (EGD) WITH PROPOFOL;  Surgeon: Christena Deem, MD;  Location: [facility] ENDOSCOPY;  Service: Endoscopy;  Laterality: N/A;   HERNIA REPAIR     TYMPANOPLASTY Left    Patient Active Problem List   Diagnosis Date Noted   Opiate overdose (HCC)    Narcotic overdose (HCC) 03/29/2017   Coronary artery disease involving native coronary artery of native heart without angina pectoris 02/01/2017   Shortness of breath 09/01/2016   (HFpEF) heart failure with preserved ejection fraction (HCC) 09/01/2016   Hypertension 09/01/2016   Mixed hyperlipidemia 09/01/2016   Chest pain 08/15/2016    PCP: Leanna Sato, MD  REFERRING PROVIDER: Weldon Picking, MD  REFERRING DIAG: Unilateral primary osteoarthritis, left knee  THERAPY DIAG:  Stiffness of left knee, not elsewhere classified  Stiffness of knee joint, left  Chronic pain of left knee  Muscle weakness (generalized)  Gait difficulty  Joint stiffness of knee, left  Rationale for  Evaluation and Treatment: Rehabilitation  ONSET DATE: 3 years ago.  Chronic  SUBJECTIVE:   SUBJECTIVE STATEMENT: Pt. Entered PT with c/o 8/10 L knee pain while using RW.  Pt. Reports he has to lose weight and is doing a 2,000 calorie/ day diet.  Does not believe in wt. Loss surgery.  Pt. Reports B ankles/ L knee is "bone on bone".  Pt. Wears heavy duty B ankle braces.  Goal weight 200# and is currently 338#.  Pt. unable to golf.    PERTINENT HISTORY: Pt. Had synovial injection with minimal benefit.  See MD note [facility] care everywhere).  Pt. Discussed cellulitis in R knee/lower leg while going to [employer] (hospitalized).  PAIN:  Are you having pain? Yes: NPRS scale: 8/10 Pain location: L medial knee Pain description: sharp pain Aggravating factors: increase activity/ walking Relieving factors: Meds (minimal benefit from opioids)  PRECAUTIONS: Fall  WEIGHT BEARING RESTRICTIONS: No  FALLS:  Has patient fallen in last 6 months? Yes. Number of falls 2x L knee buckle (able to return to stand independently).    LIVING ENVIRONMENT: Lives with: lives with their spouse Lives in: House/apartment Stairs: No Has following equipment at home: Dan Humphreys - 2 wheeled  OCCUPATION: Retired for 12 years.    PLOF: Requires assistive device for independence  Has RW and SPC.  Has compression stockings/ ankle braces.  Occasionally uses rollator and sits on rollator.  PATIENT GOALS:  Increase mobility/ weight loss to be able to have L TKA.   NEXT MD VISIT:  June 2024  OBJECTIVE:   DIAGNOSTIC FINDINGS: See MD notes  PATIENT SURVEYS:  FOTO initial 35/ goal 73  COGNITION: Overall cognitive status: Within functional limits for tasks assessed     SENSATION: Not tested  Pt. Has heavy duty custom ankle braces on.  Pt. Will not wear next tx.    EDEMA:  Circumferential: TBD next tx.  MUSCLE LENGTH: Hamstrings: Right 38 deg; Left 35 deg  POSTURE: rounded shoulders  PALPATION: Significant L  medial knee tenderness/ no tenderness on lateral aspect of knee.      LOWER EXTREMITY ROM:  R knee 0 to 104 deg.  L knee 0 to 102 deg.  B LE are in resting ER position while lying on mat table.  LBP with hamstring stretches.    LOWER EXTREMITY MMT:   R quad/ hamstring 5/5 MMT.  L quad 5/5, hamstring 4/5 (pain).  Hip flexion 5/5 MMT.    LOWER EXTREMITY SPECIAL TESTS:  Knee special test: difficulty assessing.    FUNCTIONAL TESTS:  5 times sit to stand: TBD  GAIT: Distance walked: In clinic/ hallway Assistive device utilized: Walker - 2 wheeled Level of assistance: Modified independence Comments: Pt. Able to ambulate with moderate L antalgic gait pattern and use of RW.  Pt. Ambulates <10 feet with no assistive device and significant L knee pain/ antalgia.  Increase fall risk.    07/13/22:  L/R: knee joint line: 52 cm/ 54 cm.  Mid-gastroc (11 cm from inferior patella): 46 cm./ 49 cm.  Distal quad. (5 cm superior from patella): 54.75 cm/ 55 cm.     TODAY'S TREATMENT:                                                                                                                              DATE: 07/18/22   Subjective: Pt. Reports 7/10 L knee pain while walking into PT clinic. Pt. States compliance with HEP and reports some soreness but no increase in pain. Pt. Reports 3/10 back pain due to moving gym equipment over the weekend. Pt. Reports decrease steadiness in knee with fatigue post exercise.   There.ex.:   Nustep L2 8 min B UE/LE.  Pt. Reports 9/10 L knee pain with activity  Walking in //-bars (forward/ lateral) 2 laps each with light UE assist.  Increase antalgic gait without UE assist.    Standing in //-bars: B hip abduction and ext. With 4 lb. Ankle wts. using //-bars for UE assist 2 x 15 reps. Cuing to correct posture/ prevent forward lean during hip extension.    Sitting long arc quads 4 lb. Weight 3 x 15   6" step alternating toe taps 2 x 20 in //-bars for UE assist.   6"  step alternating heel taps 2 x 20 in //-bars for UE assist.    PATIENT EDUCATION:  Education details: Gait with RW.  Access Code: [email or web address] Person educated: Patient Education method: Explanation and Demonstration Education comprehension: verbalized understanding and returned demonstration  HOME EXERCISE PROGRAM: Access Code: [email or web address] URL: [email or web address] Date: 07/13/2022 Prepared by: Dorcas Carrow  Exercises - Seated Long Arc Quad  - 2 x daily - 7 x weekly - 2 sets - 10 reps - Seated Heel Toe Raises  - 2 x daily - 7 x weekly - 2 sets - 10 reps - Standing Hip Abduction with Counter Support  - 2 x daily - 7 x weekly - 2 sets - 10 reps - Standing Hip Extension with Counter Support  - 2 x daily - 7 x weekly - 2 sets - 10 reps - Standing March with Counter Support  - 2 x daily - 7 x weekly - 2 sets - 10 reps  ASSESSMENT:  CLINICAL IMPRESSION: Pt. Worked hard during therapy showing determination for improvement. Pt. presented to clinic with significant L knee pain and antalgic gait both with and without assistive device. Pt. Walking in clinic today without ankle braces. Good B DF/PF AROM noted while in seated position. Pt. Requires mod. UE assist in //-bars to improve gait pattern/ decrease L knee discomfort. Pt. Required rest breaks after gait traning in //-bars. Pt. Will benefit from a consistent HEP to increase LE strength/ standing tolerance to improve functional mobility/ prepare for L TKA.     OBJECTIVE IMPAIRMENTS: Abnormal gait, decreased activity tolerance, decreased balance, decreased endurance, decreased mobility, difficulty walking, decreased ROM, decreased strength, hypomobility, increased edema, impaired flexibility, improper body mechanics, postural dysfunction, obesity, and pain.   ACTIVITY LIMITATIONS: carrying, lifting, bending, standing, squatting, stairs, transfers, and locomotion level  PARTICIPATION LIMITATIONS: cleaning, driving, shopping, and  community activity  PERSONAL FACTORS: Fitness and Past/current experiences are also affecting patient's functional outcome.   REHAB POTENTIAL: Fair obesity/ B ankle joint pain/ limitations.   CLINICAL DECISION MAKING: Evolving/moderate complexity  EVALUATION COMPLEXITY: Moderate   GOALS: Goals reviewed with patient? Yes  SHORT TERM GOALS: Target date: 08/08/22 Pt. Will be independent with HEP to increase B hip/LE muscle strength to 5/5 MMT to improve standing tolerance/ mobility.  Baseline:  See above Goal status: INITIAL   LONG TERM GOALS: Target date: 10/03/22  Pt. Will increase FOTO to 49 to improve functional mobility.   Baseline: initial 35 Goal status: INITIAL  2.  Pt. Able to complete 30 minutes of there.ex. with no increase c/o L medial knee pain.   Baseline:  pt. Currently not exercising/ limited mobility.   Goal status: INITIAL  3.  Pt. Will ambulate 300 feet with least assistive device and no increase c/o L knee pain to improve pain-free mobility.   Baseline:  see above Goal status: INITIAL  PLAN:  PT FREQUENCY: 2x/week  PT DURATION: 12 weeks  PLANNED INTERVENTIONS: Therapeutic exercises, Therapeutic activity, Neuromuscular re-education, Balance training, Gait training, Patient/Family education, Self Care, Joint mobilization, Stair training, DME instructions, Cryotherapy, Moist heat, Manual therapy, and Re-evaluation  PLAN FOR NEXT SESSION:  ISSUE new progressive HEP/ walking endurance (3MWT or 6MWT)    Rod Holler, SPT Pura Spice, PT, DPT # [phone] 07/18/2022, 3:56 PM

## 2022-07-20 ENCOUNTER — Ambulatory Visit: Payer: Medicare Other | Admitting: Physical Therapy

## 2022-07-20 DIAGNOSIS — G8929 Other chronic pain: Secondary | ICD-10-CM

## 2022-07-20 DIAGNOSIS — R269 Unspecified abnormalities of gait and mobility: Secondary | ICD-10-CM

## 2022-07-20 DIAGNOSIS — M6281 Muscle weakness (generalized): Secondary | ICD-10-CM

## 2022-07-20 DIAGNOSIS — M25662 Stiffness of left knee, not elsewhere classified: Secondary | ICD-10-CM

## 2022-07-20 DIAGNOSIS — M25562 Pain in left knee: Secondary | ICD-10-CM | POA: Diagnosis not present

## 2022-07-20 NOTE — Therapy (Signed)
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), w CHF ([place])    CHF (congestive heart failure) ([place])    Gout    Hypertension    Renal disorder    Sleep apnea    Vitamin D deficiency    Past Surgical History:  Procedure Laterality Date   COLONOSCOPY WITH PROPOFOL N/A 02/03/2017   Procedure: COLONOSCOPY WITH PROPOFOL;  Surgeon: Lollie Sails, MD;  Location: [facility] ENDOSCOPY;  Service: Endoscopy;  Laterality: N/A;   ESOPHAGOGASTRODUODENOSCOPY (EGD) WITH PROPOFOL N/A 02/03/2017   Procedure: ESOPHAGOGASTRODUODENOSCOPY (EGD) WITH PROPOFOL;  Surgeon: Lollie Sails, MD;  Location: [facility] ENDOSCOPY;  Service: Endoscopy;  Laterality: N/A;   HERNIA REPAIR     TYMPANOPLASTY Left    Patient Active Problem List   Diagnosis Date Noted   Opiate overdose ([place])    Narcotic overdose ([place]) 03/29/2017   Coronary artery disease involving native coronary artery of native heart without angina pectoris 02/01/2017   Shortness of breath 09/01/2016   (HFpEF) heart failure with preserved ejection fraction ([place]) 09/01/2016   Hypertension 09/01/2016   Mixed hyperlipidemia 09/01/2016   Chest pain 08/15/2016    PCP: Marguerita Merles, MD  REFERRING PROVIDER: [place] Desanctis, MD  REFERRING DIAG: Unilateral primary osteoarthritis, left knee  THERAPY DIAG:  Chronic pain of left knee  Stiffness of left knee, not elsewhere classified  Muscle weakness  (generalized)  Gait difficulty  Joint stiffness of knee, left  Rationale for Evaluation and Treatment: Rehabilitation  ONSET DATE: 3 years ago.  Chronic  SUBJECTIVE:   SUBJECTIVE STATEMENT: Pt. Entered PT with c/o 8/10 L knee pain while using RW.  Pt. Reports he has to lose weight and is doing a 2,000 calorie/ day diet.  Does not believe in wt. Loss surgery.  Pt. Reports B ankles/ L knee is "bone on bone".  Pt. Wears heavy duty B ankle braces.  Goal weight 200# and is currently 338#.  Pt. unable to golf.    PERTINENT HISTORY: Pt. Had synovial injection with minimal benefit.  See MD note [facility] care everywhere).  Pt. Discussed cellulitis in R knee/lower leg while going to [employer] (hospitalized).  PAIN:  Are you having pain? Yes: NPRS scale: 8/10 Pain location: L medial knee Pain description: sharp pain Aggravating factors: increase activity/ walking Relieving factors: Meds (minimal benefit from opioids)  PRECAUTIONS: Fall  WEIGHT BEARING RESTRICTIONS: No  FALLS:  Has patient fallen in last 6 months? Yes. Number of falls 2x L knee buckle (able to return to stand independently).    LIVING ENVIRONMENT: Lives with: lives with their spouse Lives in: House/apartment Stairs: No Has following equipment at home: Gilford Rile - 2 wheeled  OCCUPATION: Retired for 12 years.    PLOF: Requires assistive device for independence  Has RW and SPC.  Has compression stockings/  ankle braces.  Occasionally uses rollator and sits on rollator.    PATIENT GOALS:  Increase mobility/ weight loss to be able to have L TKA.   NEXT MD VISIT:  June 2024  OBJECTIVE:   DIAGNOSTIC FINDINGS: See MD notes  PATIENT SURVEYS:  FOTO initial 35/ goal 25  COGNITION: Overall cognitive status: Within functional limits for tasks assessed     SENSATION: Not tested  Pt. Has heavy duty custom ankle braces on.  Pt. Will not wear next tx.    EDEMA:  Circumferential: TBD next tx.  MUSCLE LENGTH: Hamstrings:  Right 38 deg; Left 35 deg  POSTURE: rounded shoulders  PALPATION: Significant L medial knee tenderness/ no tenderness on lateral aspect of knee.      LOWER EXTREMITY ROM:  R knee 0 to 104 deg.  L knee 0 to 102 deg.  B LE are in resting ER position while lying on mat table.  LBP with hamstring stretches.    LOWER EXTREMITY MMT:   R quad/ hamstring 5/5 MMT.  L quad 5/5, hamstring 4/5 (pain).  Hip flexion 5/5 MMT.    LOWER EXTREMITY SPECIAL TESTS:  Knee special test: difficulty assessing.    FUNCTIONAL TESTS:  5 times sit to stand: TBD  GAIT: Distance walked: In clinic/ hallway Assistive device utilized: Walker - 2 wheeled Level of assistance: Modified independence Comments: Pt. Able to ambulate with moderate L antalgic gait pattern and use of RW.  Pt. Ambulates <10 feet with no assistive device and significant L knee pain/ antalgia.  Increase fall risk.    07/13/22:  L/R: knee joint line: 52 cm/ 54 cm.  Mid-gastroc (11 cm from inferior patella): 46 cm./ 49 cm.  Distal quad. (5 cm superior from patella): 54.75 cm/ 55 cm.     TODAY'S TREATMENT:                                                                                                                              DATE: 07/20/22   Subjective: Pt. Reports 8/10 L knee pain while walking into PT clinic. Pt. States walking around the house without walker with no increase in pain. Pt. Did not do HEP between visits due to social activities (birthday plans).   There.ex.:   Nustep L2 9 min B UE/LE .36 miles.  Pt. Reports no increase in pain with activity.   Walking in //-bars (forward/ lateral/backwards) 3 laps each with light UE assist.  Increase antalgic gait without UE assist.   O2: 96 HR: 104   Standing in //-bars: B hip abduction and ext. With 4 lb. Ankle wts. using //-bars for light UE assist 2 x 15 reps each leg. Cuing to correct posture/ prevent forward lean during hip extension.    6" step alternating heel taps with 4 lb.  Ankle wt. 2 x 15 reps in //-bars for mod. UE assist.   6" step ups in //-bars alternating leading foot 3 x 10  reps with rest break b/w sets.   Sit to stand from large clinic chair without hands. 2 x 12 reps.   PATIENT EDUCATION:  Education details: Gait with RW.  Access Code: [email or web address] Person educated: Patient Education method: Explanation and Demonstration Education comprehension: verbalized understanding and returned demonstration  HOME EXERCISE PROGRAM: Access Code: [email or web address] URL: [email or web address] Date: 07/13/2022 Prepared by: Dorene Grebe  Exercises - Seated Long Arc Quad  - 2 x daily - 7 x weekly - 2 sets - 10 reps - Seated Heel Toe Raises  - 2 x daily - 7 x weekly - 2 sets - 10 reps - Standing Hip Abduction with Counter Support  - 2 x daily - 7 x weekly - 2 sets - 10 reps - Standing Hip Extension with Counter Support  - 2 x daily - 7 x weekly - 2 sets - 10 reps - Standing March with Counter Support  - 2 x daily - 7 x weekly - 2 sets - 10 reps  ASSESSMENT:  CLINICAL IMPRESSION: Pt. Worked hard during therapy showing determination for improvement. Pt. presented to clinic with 8/10 L knee pain and antalgic gait without assistive device. Pt. Walking in clinic today without ankle braces. Pt. Has progressed to light UE assist in //-bars to improve gait pattern/ decrease L knee discomfort. Pt. Progressed to step ups today with mod. UE Assist of //-bars. Pt. Cued to have equal stride lengths and stand up tall during //-bar gait training. Pt. Had no LOB during today's treatment. Pt. Required rest breaks after gait traning in //-bars. Pt. Will benefit from a consistent HEP to increase LE strength/ standing tolerance to improve functional mobility/ prepare for L TKA.     OBJECTIVE IMPAIRMENTS: Abnormal gait, decreased activity tolerance, decreased balance, decreased endurance, decreased mobility, difficulty walking, decreased ROM, decreased strength, hypomobility, increased  edema, impaired flexibility, improper body mechanics, postural dysfunction, obesity, and pain.   ACTIVITY LIMITATIONS: carrying, lifting, bending, standing, squatting, stairs, transfers, and locomotion level  PARTICIPATION LIMITATIONS: cleaning, driving, shopping, and community activity  PERSONAL FACTORS: Fitness and Past/current experiences are also affecting patient's functional outcome.   REHAB POTENTIAL: Fair obesity/ B ankle joint pain/ limitations.   CLINICAL DECISION MAKING: Evolving/moderate complexity  EVALUATION COMPLEXITY: Moderate   GOALS: Goals reviewed with patient? Yes  SHORT TERM GOALS: Target date: 08/08/22 Pt. Will be independent with HEP to increase B hip/LE muscle strength to 5/5 MMT to improve standing tolerance/ mobility.  Baseline:  See above Goal status: INITIAL   LONG TERM GOALS: Target date: 10/03/22  Pt. Will increase FOTO to 49 to improve functional mobility.   Baseline: initial 35 Goal status: INITIAL  2.  Pt. Able to complete 30 minutes of there.ex. with no increase c/o L medial knee pain.   Baseline:  pt. Currently not exercising/ limited mobility.   Goal status: INITIAL  3.  Pt. Will ambulate 300 feet with least assistive device and no increase c/o L knee pain to improve pain-free mobility.   Baseline:  see above Goal status: INITIAL  PLAN:  PT FREQUENCY: 2x/week  PT DURATION: 12 weeks  PLANNED INTERVENTIONS: Therapeutic exercises, Therapeutic activity, Neuromuscular re-education, Balance training, Gait training, Patient/Family education, Self Care, Joint mobilization, Stair training, DME instructions, Cryotherapy, Moist heat, Manual therapy, and Re-evaluation  PLAN FOR NEXT SESSION:  ISSUE new progressive HEP/ walking endurance ( or )    Everlene Other, SPT Cammie Mcgee, PT, DPT # [id number] 07/20/2022, 3:51 PM

## 2022-07-25 ENCOUNTER — Ambulatory Visit: Payer: Medicare Other | Admitting: Physical Therapy

## 2022-07-25 DIAGNOSIS — G8929 Other chronic pain: Secondary | ICD-10-CM

## 2022-07-25 DIAGNOSIS — M6281 Muscle weakness (generalized): Secondary | ICD-10-CM

## 2022-07-25 DIAGNOSIS — R269 Unspecified abnormalities of gait and mobility: Secondary | ICD-10-CM

## 2022-07-25 DIAGNOSIS — M25662 Stiffness of left knee, not elsewhere classified: Secondary | ICD-10-CM

## 2022-07-25 DIAGNOSIS — M25562 Pain in left knee: Secondary | ICD-10-CM | POA: Diagnosis not present

## 2022-07-25 NOTE — Therapy (Signed)
OUTPATIENT PHYSICAL THERAPY LOWER EXTREMITY TREATMENT  Patient Name: Wyatt Hernandez MRN: [id number] DOB:1950-09-29, 72 y.o., male Today's Date: 07/25/2022  END OF SESSION:  PT End of Session - 07/25/22 0849     Visit Number 5    Number of Visits 24    Date for PT Re-Evaluation 10/03/22    PT Start Time 0845    PT Stop Time 0941    PT Time Calculation (min) 56 min    Activity Tolerance Patient tolerated treatment well;Patient limited by fatigue    Behavior During Therapy [facility] for tasks assessed/performed             Past Medical History:  Diagnosis Date   Benign hypertensive heart and CKD, 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), w CHF (HCC)    CHF (congestive heart failure) ([place])    Gout    Hypertension    Renal disorder    Sleep apnea    Vitamin D deficiency    Past Surgical History:  Procedure Laterality Date   COLONOSCOPY WITH PROPOFOL N/A 02/03/2017   Procedure: COLONOSCOPY WITH PROPOFOL;  Surgeon: Lollie Sails, MD;  Location: [facility] ENDOSCOPY;  Service: Endoscopy;  Laterality: N/A;   ESOPHAGOGASTRODUODENOSCOPY (EGD) WITH PROPOFOL N/A 02/03/2017   Procedure: ESOPHAGOGASTRODUODENOSCOPY (EGD) WITH PROPOFOL;  Surgeon: Lollie Sails, MD;  Location: [facility] ENDOSCOPY;  Service: Endoscopy;  Laterality: N/A;   HERNIA REPAIR     TYMPANOPLASTY Left    Patient Active Problem List   Diagnosis Date Noted   Opiate overdose ([place])    Narcotic overdose ([place]) 03/29/2017   Coronary artery disease involving native coronary artery of native heart without angina pectoris 02/01/2017   Shortness of breath 09/01/2016   (HFpEF) heart failure with preserved ejection fraction ([place]) 09/01/2016   Hypertension 09/01/2016   Mixed hyperlipidemia 09/01/2016   Chest pain 08/15/2016    PCP: Marguerita Merles, MD  REFERRING PROVIDER: Emmet Desanctis, MD  REFERRING DIAG: Unilateral primary osteoarthritis, left knee  THERAPY DIAG:  Chronic pain of left knee  Stiffness of left knee, not elsewhere  classified  Muscle weakness (generalized)  Gait difficulty  Joint stiffness of knee, left  Rationale for Evaluation and Treatment: Rehabilitation  ONSET DATE: 3 years ago.  Chronic  SUBJECTIVE:   SUBJECTIVE STATEMENT: Pt. Entered PT with c/o 8/10 L knee pain while using RW.  Pt. Reports he has to lose weight and is doing a 2,000 calorie/ day diet.  Does not believe in wt. Loss surgery.  Pt. Reports B ankles/ L knee is "bone on bone".  Pt. Wears heavy duty B ankle braces.  Goal weight 200# and is currently 338#.  Pt. unable to golf.    PERTINENT HISTORY: Pt. Had synovial injection with minimal benefit.  See MD note [facility] care everywhere).  Pt. Discussed cellulitis in R knee/lower leg while going to [employer] (hospitalized).  PAIN:  Are you having pain? Yes: NPRS scale: 8/10 Pain location: L medial knee Pain description: sharp pain Aggravating factors: increase activity/ walking Relieving factors: Meds (minimal benefit from opioids)  PRECAUTIONS: Fall  WEIGHT BEARING RESTRICTIONS: No  FALLS:  Has patient fallen in last 6 months? Yes. Number of falls 2x L knee buckle (able to return to stand independently).    LIVING ENVIRONMENT: Lives with: lives with their spouse Lives in: House/apartment Stairs: No Has following equipment at home: Gilford Rile - 2 wheeled  OCCUPATION: Retired for 12 years.    PLOF: Requires assistive device for independence  Has RW and SPC.  Has compression stockings/ ankle braces.  Occasionally uses rollator and sits on rollator.    PATIENT GOALS:  Increase mobility/ weight loss to be able to have L TKA.   NEXT MD VISIT:  June 2024  OBJECTIVE:   DIAGNOSTIC FINDINGS: See MD notes  PATIENT SURVEYS:  FOTO initial 35/ goal 2  COGNITION: Overall cognitive status: Within functional limits for tasks assessed     SENSATION: Not tested  Pt. Has heavy duty custom ankle braces on.  Pt. Will not wear next tx.    EDEMA:  Circumferential: TBD next  tx.  MUSCLE LENGTH: Hamstrings: Right 38 deg; Left 35 deg  POSTURE: rounded shoulders  PALPATION: Significant L medial knee tenderness/ no tenderness on lateral aspect of knee.      LOWER EXTREMITY ROM:  R knee 0 to 104 deg.  L knee 0 to 102 deg.  B LE are in resting ER position while lying on mat table.  LBP with hamstring stretches.    LOWER EXTREMITY MMT:   R quad/ hamstring 5/5 MMT.  L quad 5/5, hamstring 4/5 (pain).  Hip flexion 5/5 MMT.    LOWER EXTREMITY SPECIAL TESTS:  Knee special test: difficulty assessing.    FUNCTIONAL TESTS:  5 times sit to stand: TBD  GAIT: Distance walked: In clinic/ hallway Assistive device utilized: Walker - 2 wheeled Level of assistance: Modified independence Comments: Pt. Able to ambulate with moderate L antalgic gait pattern and use of RW.  Pt. Ambulates <10 feet with no assistive device and significant L knee pain/ antalgia.  Increase fall risk.    07/13/22:  L/R: knee joint line: 52 cm/ 54 cm.  Mid-gastroc (11 cm from inferior patella): 46 cm./ 49 cm.  Distal quad. (5 cm superior from patella): 54.75 cm/ 55 cm.     TODAY'S TREATMENT:                                                                                                                              DATE: 07/25/22   Subjective: Pt. Reports 9/10 L knee pain while walking into PT clinic. Pt. States he was not able to do HEP due to high pain level and weather. Pt. States he was experiencing some SOB this weekend. Pt. States he has an MRI scheduled in February due to high PSA levels in his last blood work.   There.ex.:   Nustep L3-1 10 min B UE/LE .29 miles.  Level decrease due to pt. being limited due to pain.   B. LAQ 4lb. Ankle wt.'s 3x10  6" alternating step ups using handrails for UE support 3 x 8 HR: 114 O2: 94%  Walking in //-bars with 4 lb. Ankle wt.'s (forward/ lateral/backwards) 3 laps each with light UE assist.  Increase antalgic gait without UE assist.   O2: 93 HR: 128  Rest breaks required between each.    6" step alternating heel taps with 4 lb. Ankle wt. 2 x 15  reps in //-bars for mod. UE assist.   6" lateral step ups in //-bars alternating leading foot 1 x 10 reps with rest break b/w sets. Limited sets due to pain level 10/10 NPS.   Ball squeezes between knees with 3 sec. Hold 2 x 10. Pt. Cued to brace core.   PATIENT EDUCATION:  Education details: Gait with RW.  Access Code: [email or web address] Person educated: Patient Education method: Explanation and Demonstration Education comprehension: verbalized understanding and returned demonstration  HOME EXERCISE PROGRAM: Access Code: [email or web address] URL: [email or web address] Date: 07/13/2022 Prepared by: Dorene Grebe  Exercises - Seated Long Arc Quad  - 2 x daily - 7 x weekly - 2 sets - 10 reps - Seated Heel Toe Raises  - 2 x daily - 7 x weekly - 2 sets - 10 reps - Standing Hip Abduction with Counter Support  - 2 x daily - 7 x weekly - 2 sets - 10 reps - Standing Hip Extension with Counter Support  - 2 x daily - 7 x weekly - 2 sets - 10 reps - Standing March with Counter Support  - 2 x daily - 7 x weekly - 2 sets - 10 reps  ASSESSMENT:  CLINICAL IMPRESSION: Pt. Worked hard during therapy showing determination for improvement. Pt. presented to clinic with 9/10 L knee pain and antalgic gait without assistive device. Pt. Walking in clinic today without ankle braces. Pt. Has progressed to light UE assist in //-bars to improve gait pattern/ decrease L knee discomfort. Pt. Was limited by pain in today's treatment and required frequent rest breaks. Pt. Was closely monitored via pulse ox. Pt. Cued to have equal stride lengths and stand up tall during //-bar gait training. Pt. Had no LOB during today's treatment. Pt. Will benefit from a consistent HEP to increase LE strength/ standing tolerance to improve functional mobility/ prepare for L TKA.     OBJECTIVE IMPAIRMENTS: Abnormal gait, decreased activity  tolerance, decreased balance, decreased endurance, decreased mobility, difficulty walking, decreased ROM, decreased strength, hypomobility, increased edema, impaired flexibility, improper body mechanics, postural dysfunction, obesity, and pain.   ACTIVITY LIMITATIONS: carrying, lifting, bending, standing, squatting, stairs, transfers, and locomotion level  PARTICIPATION LIMITATIONS: cleaning, driving, shopping, and community activity  PERSONAL FACTORS: Fitness and Past/current experiences are also affecting patient's functional outcome.   REHAB POTENTIAL: Fair obesity/ B ankle joint pain/ limitations.   CLINICAL DECISION MAKING: Evolving/moderate complexity  EVALUATION COMPLEXITY: Moderate   GOALS: Goals reviewed with patient? Yes  SHORT TERM GOALS: Target date: 08/08/22 Pt. Will be independent with HEP to increase B hip/LE muscle strength to 5/5 MMT to improve standing tolerance/ mobility.  Baseline:  See above Goal status: INITIAL   LONG TERM GOALS: Target date: 10/03/22  Pt. Will increase FOTO to 49 to improve functional mobility.   Baseline: initial 35 Goal status: INITIAL  2.  Pt. Able to complete 30 minutes of there.ex. with no increase c/o L medial knee pain.   Baseline:  pt. Currently not exercising/ limited mobility.   Goal status: INITIAL  3.  Pt. Will ambulate 300 feet with least assistive device and no increase c/o L knee pain to improve pain-free mobility.   Baseline:  see above Goal status: INITIAL  PLAN:  PT FREQUENCY: 2x/week  PT DURATION: 12 weeks  PLANNED INTERVENTIONS: Therapeutic exercises, Therapeutic activity, Neuromuscular re-education, Balance training, Gait training, Patient/Family education, Self Care, Joint mobilization, Stair training, DME instructions, Cryotherapy, Moist heat, Manual therapy, and Re-evaluation  PLAN FOR NEXT SESSION:  ISSUE  new progressive HEP/ walking endurance (3MWT or 6MWT)    Rod Holler, SPT Pura Spice, PT, DPT #  [phone] 07/25/2022, 1:03 PM

## 2022-07-27 ENCOUNTER — Ambulatory Visit: Payer: Medicare Other

## 2022-07-27 DIAGNOSIS — R269 Unspecified abnormalities of gait and mobility: Secondary | ICD-10-CM

## 2022-07-27 DIAGNOSIS — G8929 Other chronic pain: Secondary | ICD-10-CM

## 2022-07-27 DIAGNOSIS — M25662 Stiffness of left knee, not elsewhere classified: Secondary | ICD-10-CM

## 2022-07-27 DIAGNOSIS — M25562 Pain in left knee: Secondary | ICD-10-CM | POA: Diagnosis not present

## 2022-07-27 NOTE — Therapy (Signed)
OUTPATIENT PHYSICAL THERAPY LOWER EXTREMITY TREATMENT  Patient Name: Wyatt Hernandez MRN: [id number] DOB:[birth year], 72 y.o., male Today's Date: 07/27/2022  END OF SESSION:  PT End of Session - 07/27/22 0903     Visit Number 6    Number of Visits 24    Date for PT Re-Evaluation 10/03/22    PT Start Time 0904    PT Stop Time 0950    PT Time Calculation (min) 46 min    Equipment Utilized During Treatment Gait belt    Activity Tolerance Patient tolerated treatment well;Patient limited by fatigue    Behavior During Therapy WFL for tasks assessed/performed             Past Medical History:  Diagnosis Date   Benign hypertensive heart and CKD, 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), w CHF (HCC)    CHF (congestive heart failure) (HCC)    Gout    Hypertension    Renal disorder    Sleep apnea    Vitamin D deficiency    Past Surgical History:  Procedure Laterality Date   COLONOSCOPY WITH PROPOFOL N/A 02/03/2017   Procedure: COLONOSCOPY WITH PROPOFOL;  Surgeon: Christena Deem, MD;  Location: [facility] ENDOSCOPY;  Service: Endoscopy;  Laterality: N/A;   ESOPHAGOGASTRODUODENOSCOPY (EGD) WITH PROPOFOL N/A 02/03/2017   Procedure: ESOPHAGOGASTRODUODENOSCOPY (EGD) WITH PROPOFOL;  Surgeon: Christena Deem, MD;  Location: [facility] ENDOSCOPY;  Service: Endoscopy;  Laterality: N/A;   HERNIA REPAIR     TYMPANOPLASTY Left    Patient Active Problem List   Diagnosis Date Noted   Opiate overdose (HCC)    Narcotic overdose (HCC) 03/29/2017   Coronary artery disease involving native coronary artery of native heart without angina pectoris 02/01/2017   Shortness of breath 09/01/2016   (HFpEF) heart failure with preserved ejection fraction (HCC) 09/01/2016   Hypertension 09/01/2016   Mixed hyperlipidemia 09/01/2016   Chest pain 08/15/2016    PCP: Leanna Sato, MD  REFERRING PROVIDER: Weldon Picking, MD  REFERRING DIAG: Unilateral primary osteoarthritis, left knee  THERAPY DIAG:  Chronic pain of left  knee  Stiffness of left knee, not elsewhere classified  Gait difficulty  Joint stiffness of knee, left  Rationale for Evaluation and Treatment: Rehabilitation  ONSET DATE: 3 years ago.  Chronic  SUBJECTIVE:   SUBJECTIVE STATEMENT: Pt. Entered PT with c/o 8/10 L knee pain while using RW.  Pt. Reports he has to lose weight and is doing a 2,000 calorie/ day diet.  Does not believe in wt. Loss surgery.  Pt. Reports B ankles/ L knee is "bone on bone".  Pt. Wears heavy duty B ankle braces.  Goal weight 200# and is currently 338#.  Pt. unable to golf.    PERTINENT HISTORY: Pt. Had synovial injection with minimal benefit.  See MD note [facility] care everywhere).  Pt. Discussed cellulitis in R knee/lower leg while going to [employer] (hospitalized).  PAIN:  Are you having pain? Yes: NPRS scale: 8/10 Pain location: L medial knee Pain description: sharp pain Aggravating factors: increase activity/ walking Relieving factors: Meds (minimal benefit from opioids)  PRECAUTIONS: Fall  WEIGHT BEARING RESTRICTIONS: No  FALLS:  Has patient fallen in last 6 months? Yes. Number of falls 2x L knee buckle (able to return to stand independently).    LIVING ENVIRONMENT: Lives with: lives with their spouse Lives in: House/apartment Stairs: No Has following equipment at home: Dan Humphreys - 2 wheeled  OCCUPATION: Retired for 12 years.    PLOF: Requires assistive device for independence  Has RW and SPC.  Has compression stockings/ ankle braces.  Occasionally uses rollator and sits on rollator.    PATIENT GOALS:  Increase mobility/ weight loss to be able to have L TKA.   NEXT MD VISIT:  June 2024  OBJECTIVE:   DIAGNOSTIC FINDINGS: See MD notes  PATIENT SURVEYS:  FOTO initial 35/ goal 63  COGNITION: Overall cognitive status: Within functional limits for tasks assessed     SENSATION: Not tested  Pt. Has heavy duty custom ankle braces on.  Pt. Will not wear next tx.    EDEMA:  Circumferential: TBD  next tx.  MUSCLE LENGTH: Hamstrings: Right 38 deg; Left 35 deg  POSTURE: rounded shoulders  PALPATION: Significant L medial knee tenderness/ no tenderness on lateral aspect of knee.      LOWER EXTREMITY ROM:  R knee 0 to 104 deg.  L knee 0 to 102 deg.  B LE are in resting ER position while lying on mat table.  LBP with hamstring stretches.    LOWER EXTREMITY MMT:   R quad/ hamstring 5/5 MMT.  L quad 5/5, hamstring 4/5 (pain).  Hip flexion 5/5 MMT.    LOWER EXTREMITY SPECIAL TESTS:  Knee special test: difficulty assessing.    FUNCTIONAL TESTS:  5 times sit to stand: TBD  GAIT: Distance walked: In clinic/ hallway Assistive device utilized: Walker - 2 wheeled Level of assistance: Modified independence Comments: Pt. Able to ambulate with moderate L antalgic gait pattern and use of RW.  Pt. Ambulates <10 feet with no assistive device and significant L knee pain/ antalgia.  Increase fall risk.    07/13/22:  L/R: knee joint line: 52 cm/ 54 cm.  Mid-gastroc (11 cm from inferior patella): 46 cm./ 49 cm.  Distal quad. (5 cm superior from patella): 54.75 cm/ 55 cm.     TODAY'S TREATMENT:                                                                                                                              DATE: 07/27/22   Subjective: Pt. Reports 9/10 L knee pain and 8/10 R knee pain while walking into PT clinic. Pt. States he was having significant pain when doing his HEP. Pt. Reports being able to do household chores with pain level of about 7/10 and uses his RW to ambulate throughout the home about 50% of time and a SPC/quad cane the other 50%.   There.ex.:   Nustep L3 11 min B UE/LE .34 miles.   Walking in //-bars:  Normal gait 1 lap no hands significant L LE antalgic gait, 2 laps using bars for light UE assist High knee marching 2 laps with mod. UE assist during single leg stance  3MWT: Clinic Hallway with SPC to support LE's and a gait belt for safety Distance- 72 ft Time-  1:05 min completed before pt required sitting rest break.  Able to continue another 60 ft after sitting within the  3 min time frame.    Neuro.:  Walking in clinic with [facility] with gait belt donned for safety pt. Frequent rest breaks required. PT fitted cane to pt. And educated on proper cane use. Time 10 min.  PATIENT EDUCATION:  Education details: Gait with RW.  Access Code: [email or web address] Person educated: Patient Education method: Explanation and Demonstration Education comprehension: verbalized understanding and returned demonstration  HOME EXERCISE PROGRAM: Access Code: [email or web address] URL: [email or web address] Date: 07/13/2022 Prepared by: Dorcas Carrow  Exercises - Seated Long Arc Quad  - 2 x daily - 7 x weekly - 2 sets - 10 reps - Seated Heel Toe Raises  - 2 x daily - 7 x weekly - 2 sets - 10 reps - Standing Hip Abduction with Counter Support  - 2 x daily - 7 x weekly - 2 sets - 10 reps - Standing Hip Extension with Counter Support  - 2 x daily - 7 x weekly - 2 sets - 10 reps - Standing March with Counter Support  - 2 x daily - 7 x weekly - 2 sets - 10 reps  ASSESSMENT:  CLINICAL IMPRESSION: Pt. Worked hard during therapy showing determination for improvement. Pt. presented to clinic with 9/10 L knee pain and significant antalgic gait without assistive device. Pt. Walking in clinic today without ankle braces. Pt. Has progressed to short distance ambulation in clinic using a SPC but requires frequent rest breaks. PT. Cued pt. on balance and maintaining an increased BOS to remain stable during gait. Pt. Educated on proper cane use and how to properly perform 2-point gate with a SPC. Pt. Was limited by pain and endurance in today's treatment limiting his 3MWT distance. PT used CGA during ambulation for safety but the pt. Had no instances of LOB.  Pt. Cued to have equal stride lengths and stand up tall during //-bar/SPC clinic gait training. Pt. Will benefit from a consistent HEP to  increase LE strength/ standing tolerance to improve functional mobility/ prepare for L TKA. Pt. Will continue to benefit from skilled PT services in order to increase endurance, decrease pain, and increase mobility in a safe/instructive environment.   OBJECTIVE IMPAIRMENTS: Abnormal gait, decreased activity tolerance, decreased balance, decreased endurance, decreased mobility, difficulty walking, decreased ROM, decreased strength, hypomobility, increased edema, impaired flexibility, improper body mechanics, postural dysfunction, obesity, and pain.   ACTIVITY LIMITATIONS: carrying, lifting, bending, standing, squatting, stairs, transfers, and locomotion level  PARTICIPATION LIMITATIONS: cleaning, driving, shopping, and community activity  PERSONAL FACTORS: Fitness and Past/current experiences are also affecting patient's functional outcome.   REHAB POTENTIAL: Fair obesity/ B ankle joint pain/ limitations.   CLINICAL DECISION MAKING: Evolving/moderate complexity  EVALUATION COMPLEXITY: Moderate   GOALS: Goals reviewed with patient? Yes  SHORT TERM GOALS: Target date: 08/08/22 Pt. Will be independent with HEP to increase B hip/LE muscle strength to 5/5 MMT to improve standing tolerance/ mobility.  Baseline:  See above Goal status: INITIAL   LONG TERM GOALS: Target date: 10/03/22  Pt. Will increase FOTO to 49 to improve functional mobility.   Baseline: initial 35 Goal status: INITIAL  2.  Pt. Able to complete 30 minutes of there.ex. with no increase c/o L medial knee pain.   Baseline:  pt. Currently not exercising/ limited mobility.   Goal status: INITIAL  3.  Pt. Will ambulate 300 feet with least assistive device and no increase c/o L knee pain to improve pain-free mobility.   Baseline:  see above Goal status: INITIAL  PLAN:  PT FREQUENCY: 2x/week  PT DURATION: 12 weeks  PLANNED INTERVENTIONS: Therapeutic exercises, Therapeutic activity, Neuromuscular re-education, Balance  training, Gait training, Patient/Family education, Self Care, Joint mobilization, Stair training, DME instructions, Cryotherapy, Moist heat, Manual therapy, and Re-evaluation  PLAN FOR NEXT SESSION:  ISSUE new progressive HEP/ walking endurance ( or )    Everlene Other, SPT Max Fickle, PT, DPT, OCS  [phone]  07/27/2022, 11:58 AM

## 2022-07-28 ENCOUNTER — Telehealth: Payer: Self-pay

## 2022-07-28 MED ORDER — DIAZEPAM 10 MG PO TABS: 10.0000 mg | ORAL_TABLET | Freq: Once | ORAL | 0 refills | Status: AC

## 2022-07-28 NOTE — Telephone Encounter (Signed)
Please assure this patient.  OK to offer Valium 1 hour prior to procedure but must have driver. Script send to [facility].  Hollice Espy, MD

## 2022-07-28 NOTE — Telephone Encounter (Signed)
Pt calls triage line and states that he is very concerned about his upcoming MRI. He has severe back problems (degenerative discs) he is concerned about having to lie on his back for an extended amount of time and is very anxious as he had a bad experience previously with an MRI scan. He is requesting a sedative for upcoming prostate biopsy. Pt also states that he is a patient of pain management clinic and is unsure if this will have any effect. Please advise.

## 2022-07-29 NOTE — Telephone Encounter (Signed)
Spoke with patient and advised RX sent in and pre caution given.

## 2022-08-01 ENCOUNTER — Ambulatory Visit: Payer: Medicare Other | Admitting: Physical Therapy

## 2022-08-01 ENCOUNTER — Ambulatory Visit: Admit: 2022-08-01 | Discharge: 2022-08-02 | Payer: MEDICARE

## 2022-08-01 DIAGNOSIS — G8929 Other chronic pain: Secondary | ICD-10-CM

## 2022-08-01 DIAGNOSIS — R269 Unspecified abnormalities of gait and mobility: Secondary | ICD-10-CM

## 2022-08-01 DIAGNOSIS — M25662 Stiffness of left knee, not elsewhere classified: Secondary | ICD-10-CM

## 2022-08-01 DIAGNOSIS — M6281 Muscle weakness (generalized): Secondary | ICD-10-CM

## 2022-08-01 DIAGNOSIS — M25562 Pain in left knee: Secondary | ICD-10-CM | POA: Diagnosis not present

## 2022-08-01 NOTE — Therapy (Signed)
OUTPATIENT PHYSICAL THERAPY LOWER EXTREMITY TREATMENT  Patient Name: Wyatt Hernandez MRN: [id number] DOB:1950-10-06, 72 y.o., male Today's Date: 08/01/2022   END OF SESSION:  PT End of Session - 08/01/22 0857     Visit Number 7    Number of Visits 24    Date for PT Re-Evaluation 10/03/22    PT Start Time 0857    PT Stop Time 0947    PT Time Calculation (min) 50 min    Equipment Utilized During Treatment Gait belt    Activity Tolerance Patient tolerated treatment well;Patient limited by fatigue    Behavior During Therapy WFL for tasks assessed/performed             Past Medical History:  Diagnosis Date   Benign hypertensive heart and CKD, 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), w CHF ([place])    CHF (congestive heart failure) ([place])    Gout    Hypertension    Renal disorder    Sleep apnea    Vitamin D deficiency    Past Surgical History:  Procedure Laterality Date   COLONOSCOPY WITH PROPOFOL N/A 02/03/2017   Procedure: COLONOSCOPY WITH PROPOFOL;  Surgeon: Lollie Sails, MD;  Location: [facility] ENDOSCOPY;  Service: Endoscopy;  Laterality: N/A;   ESOPHAGOGASTRODUODENOSCOPY (EGD) WITH PROPOFOL N/A 02/03/2017   Procedure: ESOPHAGOGASTRODUODENOSCOPY (EGD) WITH PROPOFOL;  Surgeon: Lollie Sails, MD;  Location: [facility] ENDOSCOPY;  Service: Endoscopy;  Laterality: N/A;   HERNIA REPAIR     TYMPANOPLASTY Left    Patient Active Problem List   Diagnosis Date Noted   Opiate overdose ([place])    Narcotic overdose ([place]) 03/29/2017   Coronary artery disease involving native coronary artery of native heart without angina pectoris 02/01/2017   Shortness of breath 09/01/2016   (HFpEF) heart failure with preserved ejection fraction ([place]) 09/01/2016   Hypertension 09/01/2016   Mixed hyperlipidemia 09/01/2016   Chest pain 08/15/2016    PCP: Marguerita Merles, MD  REFERRING PROVIDER: Pax Desanctis, MD  REFERRING DIAG: Unilateral primary osteoarthritis, left knee  THERAPY DIAG:  Chronic pain of left  knee  Stiffness of left knee, not elsewhere classified  Muscle weakness (generalized)  Gait difficulty  Joint stiffness of knee, left  Rationale for Evaluation and Treatment: Rehabilitation  ONSET DATE: 3 years ago.  Chronic  SUBJECTIVE:   SUBJECTIVE STATEMENT: Pt. Entered PT with c/o 8/10 L knee pain while using RW.  Pt. Reports he has to lose weight and is doing a 2,000 calorie/ day diet.  Does not believe in wt. Loss surgery.  Pt. Reports B ankles/ L knee is "bone on bone".  Pt. Wears heavy duty B ankle braces.  Goal weight 200# and is currently 338#.  Pt. unable to golf.    PERTINENT HISTORY: Pt. Had synovial injection with minimal benefit.  See MD note [facility] care everywhere).  Pt. Discussed cellulitis in R knee/lower leg while going to [employer] (hospitalized).  PAIN:  Are you having pain? Yes: NPRS scale: 8/10 Pain location: L medial knee Pain description: sharp pain Aggravating factors: increase activity/ walking Relieving factors: Meds (minimal benefit from opioids)  PRECAUTIONS: Fall  WEIGHT BEARING RESTRICTIONS: No  FALLS:  Has patient fallen in last 6 months? Yes. Number of falls 2x L knee buckle (able to return to stand independently).    LIVING ENVIRONMENT: Lives with: lives with their spouse Lives in: House/apartment Stairs: No Has following equipment at home: Gilford Rile - 2 wheeled  OCCUPATION: Retired for 12 years.    PLOF: Requires  assistive device for independence  Has RW and SPC.  Has compression stockings/ ankle braces.  Occasionally uses rollator and sits on rollator.    PATIENT GOALS:  Increase mobility/ weight loss to be able to have L TKA.   NEXT MD VISIT:  June 2024  OBJECTIVE:   DIAGNOSTIC FINDINGS: See MD notes  PATIENT SURVEYS:  FOTO initial 35/ goal 51  COGNITION: Overall cognitive status: Within functional limits for tasks assessed     SENSATION: Not tested  Pt. Has heavy duty custom ankle braces on.  Pt. Will not wear next tx.     EDEMA:  Circumferential: TBD next tx.  MUSCLE LENGTH: Hamstrings: Right 38 deg; Left 35 deg  POSTURE: rounded shoulders  PALPATION: Significant L medial knee tenderness/ no tenderness on lateral aspect of knee.      LOWER EXTREMITY ROM:  R knee 0 to 104 deg.  L knee 0 to 102 deg.  B LE are in resting ER position while lying on mat table.  LBP with hamstring stretches.    LOWER EXTREMITY MMT:   R quad/ hamstring 5/5 MMT.  L quad 5/5, hamstring 4/5 (pain).  Hip flexion 5/5 MMT.    LOWER EXTREMITY SPECIAL TESTS:  Knee special test: difficulty assessing.    FUNCTIONAL TESTS:  5 times sit to stand: TBD  GAIT: Distance walked: In clinic/ hallway Assistive device utilized: Walker - 2 wheeled Level of assistance: Modified independence Comments: Pt. Able to ambulate with moderate L antalgic gait pattern and use of RW.  Pt. Ambulates <10 feet with no assistive device and significant L knee pain/ antalgia.  Increase fall risk.    07/13/22:  L/R: knee joint line: 52 cm/ 54 cm.  Mid-gastroc (11 cm from inferior patella): 46 cm./ 49 cm.  Distal quad. (5 cm superior from patella): 54.75 cm/ 55 cm.     TODAY'S TREATMENT:                                                                                                                              DATE: 08/01/22   Subjective: Pt. Reports 7/10 L knee pain and 4/10 R knee pain while walking into PT clinic. Pt. States he had a "great day" on Friday and was able to walk around the yard for several hours with minimal pain. Pt. Completed his squats on his HEP with no increase in pain over the weekend. Pt. States feeling the least amount of pain he has felt in months.   There.ex.:   Nustep L3 10 min B UE/LE .35 miles.   Walking in //-bars:  High knee marching 2 laps with unilateral/light. UE assist during single leg stance.   6" step heel taps with 4 lb. Ankle wt.'s //-bars for safety. Pt. Progressed to unilateral UE assist. 2x10 each leg  Ball  squeezes in sitting 2x15 with 3 sec. holds  Neuro.:  Walking in hallway clinic with SPC/quad cane with gait belt donned  for safety. Frequent rest breaks required. Time 10 min. PT noted an increase in walking endurance.   PATIENT EDUCATION:  Education details: Gait with RW.  Access Code: [email or web address] Person educated: Patient Education method: Explanation and Demonstration Education comprehension: verbalized understanding and returned demonstration  HOME EXERCISE PROGRAM: Access Code: [email or web address] URL: [email or web address] Date: 07/13/2022 Prepared by: Dorcas Carrow  Exercises - Seated Long Arc Quad  - 2 x daily - 7 x weekly - 2 sets - 10 reps - Seated Heel Toe Raises  - 2 x daily - 7 x weekly - 2 sets - 10 reps - Standing Hip Abduction with Counter Support  - 2 x daily - 7 x weekly - 2 sets - 10 reps - Standing Hip Extension with Counter Support  - 2 x daily - 7 x weekly - 2 sets - 10 reps - Standing March with Counter Support  - 2 x daily - 7 x weekly - 2 sets - 10 reps  ASSESSMENT:  CLINICAL IMPRESSION: Pt. Worked hard during therapy showing determination for improvement. Pt. Has progressed to short distance ambulation in clinic using a SPC. Pt. required less frequent rest breaks during this session and is showing an increase in endurance. Pt. Educated on proper cane use and how to properly perform 2-point gate with a SPC. PT fitted pt. To quad cane and practiced ambulation with proper technique to give pt. Different options of assistive devices. PT. Cued pt. On maintaining an increased BOS to remain stable during gait. PT used CGA during ambulation for safety but the pt. Had no instances of LOB.  Pt. Cued to have equal stride lengths and stand up tall during //-bar/SPC clinic gait training. Pt. Will benefit from a consistent HEP to increase LE strength/ standing tolerance to improve functional mobility/ prepare for L TKA. Pt. Will continue to benefit from skilled PT services in  order to increase endurance, decrease pain, and increase mobility in a safe/instructive environment.   OBJECTIVE IMPAIRMENTS: Abnormal gait, decreased activity tolerance, decreased balance, decreased endurance, decreased mobility, difficulty walking, decreased ROM, decreased strength, hypomobility, increased edema, impaired flexibility, improper body mechanics, postural dysfunction, obesity, and pain.   ACTIVITY LIMITATIONS: carrying, lifting, bending, standing, squatting, stairs, transfers, and locomotion level  PARTICIPATION LIMITATIONS: cleaning, driving, shopping, and community activity  PERSONAL FACTORS: Fitness and Past/current experiences are also affecting patient's functional outcome.   REHAB POTENTIAL: Fair obesity/ B ankle joint pain/ limitations.   CLINICAL DECISION MAKING: Evolving/moderate complexity  EVALUATION COMPLEXITY: Moderate   GOALS: Goals reviewed with patient? Yes  SHORT TERM GOALS: Target date: 08/08/22 Pt. Will be independent with HEP to increase B hip/LE muscle strength to 5/5 MMT to improve standing tolerance/ mobility.  Baseline:  See above Goal status: INITIAL   LONG TERM GOALS: Target date: 10/03/22  Pt. Will increase FOTO to 49 to improve functional mobility.   Baseline: initial 35 Goal status: INITIAL  2.  Pt. Able to complete 30 minutes of there.ex. with no increase c/o L medial knee pain.   Baseline:  pt. Currently not exercising/ limited mobility.   Goal status: INITIAL  3.  Pt. Will ambulate 300 feet with least assistive device and no increase c/o L knee pain to improve pain-free mobility.   Baseline:  see above Goal status: INITIAL  PLAN:  PT FREQUENCY: 2x/week  PT DURATION: 12 weeks  PLANNED INTERVENTIONS: Therapeutic exercises, Therapeutic activity, Neuromuscular re-education, Balance training, Gait training, Patient/Family education, Self Care, Joint mobilization, Stair training, DME instructions, Cryotherapy, Moist heat,  Manual  therapy, and Re-evaluation  PLAN FOR NEXT SESSION:  ISSUE new progressive HEP/ walking endurance (3MWT or 6MWT)    Rod Holler, SPT Pura Spice, PT, DPT # [phone] 08/01/2022, 2:47 PM

## 2022-08-03 ENCOUNTER — Ambulatory Visit: Payer: Medicare Other | Admitting: Physical Therapy

## 2022-08-04 ENCOUNTER — Encounter: Payer: Self-pay | Admitting: Family Medicine

## 2022-08-08 ENCOUNTER — Encounter: Payer: Self-pay | Admitting: Physical Therapy

## 2022-08-08 ENCOUNTER — Ambulatory Visit: Payer: Medicare Other | Attending: Orthopedic Surgery | Admitting: Physical Therapy

## 2022-08-08 DIAGNOSIS — G8929 Other chronic pain: Secondary | ICD-10-CM | POA: Diagnosis present

## 2022-08-08 DIAGNOSIS — M25562 Pain in left knee: Secondary | ICD-10-CM | POA: Diagnosis present

## 2022-08-08 DIAGNOSIS — R269 Unspecified abnormalities of gait and mobility: Secondary | ICD-10-CM | POA: Diagnosis present

## 2022-08-08 DIAGNOSIS — M25662 Stiffness of left knee, not elsewhere classified: Secondary | ICD-10-CM | POA: Insufficient documentation

## 2022-08-08 DIAGNOSIS — M6281 Muscle weakness (generalized): Secondary | ICD-10-CM | POA: Insufficient documentation

## 2022-08-08 NOTE — Therapy (Signed)
OUTPATIENT PHYSICAL THERAPY LOWER EXTREMITY TREATMENT  Patient Name: Wyatt Hernandez MRN: [id number] DOB:[birth year], 72 y.o., male Today's Date: 08/08/2022   END OF SESSION:  PT End of Session - 08/08/22 0950     Visit Number 8    Number of Visits 24    Date for PT Re-Evaluation 10/03/22    PT Start Time 0856    PT Stop Time 0951    PT Time Calculation (min) 55 min    Equipment Utilized During Treatment Gait belt    Activity Tolerance Patient tolerated treatment well;Patient limited by fatigue    Behavior During Therapy WFL for tasks assessed/performed             Past Medical History:  Diagnosis Date   Benign hypertensive heart and CKD, 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), w CHF ([place])    CHF (congestive heart failure) ([place])    Gout    Hypertension    Renal disorder    Sleep apnea    Vitamin D deficiency    Past Surgical History:  Procedure Laterality Date   COLONOSCOPY WITH PROPOFOL N/A 02/03/2017   Procedure: COLONOSCOPY WITH PROPOFOL;  Surgeon: Lollie Sails, MD;  Location: [facility] ENDOSCOPY;  Service: Endoscopy;  Laterality: N/A;   ESOPHAGOGASTRODUODENOSCOPY (EGD) WITH PROPOFOL N/A 02/03/2017   Procedure: ESOPHAGOGASTRODUODENOSCOPY (EGD) WITH PROPOFOL;  Surgeon: Lollie Sails, MD;  Location: [facility] ENDOSCOPY;  Service: Endoscopy;  Laterality: N/A;   HERNIA REPAIR     TYMPANOPLASTY Left    Patient Active Problem List   Diagnosis Date Noted   Opiate overdose ([place])    Narcotic overdose ([place]) 03/29/2017   Coronary artery disease involving native coronary artery of native heart without angina pectoris 02/01/2017   Shortness of breath 09/01/2016   (HFpEF) heart failure with preserved ejection fraction ([place]) 09/01/2016   Hypertension 09/01/2016   Mixed hyperlipidemia 09/01/2016   Chest pain 08/15/2016    PCP: Marguerita Merles, MD  REFERRING PROVIDER: [place] Desanctis, MD  REFERRING DIAG: Unilateral primary osteoarthritis, left knee  THERAPY DIAG:  Chronic pain of left  knee  Stiffness of left knee, not elsewhere classified  Muscle weakness (generalized)  Gait difficulty  Rationale for Evaluation and Treatment: Rehabilitation  ONSET DATE: 3 years ago.  Chronic  SUBJECTIVE:   SUBJECTIVE STATEMENT: Pt. Entered PT with c/o 8/10 L knee pain while using RW.  Pt. Reports he has to lose weight and is doing a 2,000 calorie/ day diet.  Does not believe in wt. Loss surgery.  Pt. Reports B ankles/ L knee is "bone on bone".  Pt. Wears heavy duty B ankle braces.  Goal weight 200# and is currently 338#.  Pt. unable to golf.    PERTINENT HISTORY: Pt. Had synovial injection with minimal benefit.  See MD note [facility] care everywhere).  Pt. Discussed cellulitis in R knee/lower leg while going to [employer] (hospitalized).  PAIN:  Are you having pain? Yes: NPRS scale: 8/10 Pain location: L medial knee Pain description: sharp pain Aggravating factors: increase activity/ walking Relieving factors: Meds (minimal benefit from opioids)  PRECAUTIONS: Fall  WEIGHT BEARING RESTRICTIONS: No  FALLS:  Has patient fallen in last 6 months? Yes. Number of falls 2x L knee buckle (able to return to stand independently).    LIVING ENVIRONMENT: Lives with: lives with their spouse Lives in: House/apartment Stairs: No Has following equipment at home: Gilford Rile - 2 wheeled  OCCUPATION: Retired for 12 years.    PLOF: Requires assistive device for independence  Has  RW and SPC.  Has compression stockings/ ankle braces.  Occasionally uses rollator and sits on rollator.    PATIENT GOALS:  Increase mobility/ weight loss to be able to have L TKA.   NEXT MD VISIT:  June 2024  OBJECTIVE:   DIAGNOSTIC FINDINGS: See MD notes  PATIENT SURVEYS:  FOTO initial 35/ goal 4  COGNITION: Overall cognitive status: Within functional limits for tasks assessed     SENSATION: Not tested  Pt. Has heavy duty custom ankle braces on.  Pt. Will not wear next tx.    EDEMA:  Circumferential: TBD  next tx.  MUSCLE LENGTH: Hamstrings: Right 38 deg; Left 35 deg  POSTURE: rounded shoulders  PALPATION: Significant L medial knee tenderness/ no tenderness on lateral aspect of knee.      LOWER EXTREMITY ROM:  R knee 0 to 104 deg.  L knee 0 to 102 deg.  B LE are in resting ER position while lying on mat table.  LBP with hamstring stretches.    LOWER EXTREMITY MMT:   R quad/ hamstring 5/5 MMT.  L quad 5/5, hamstring 4/5 (pain).  Hip flexion 5/5 MMT.    LOWER EXTREMITY SPECIAL TESTS:  Knee special test: difficulty assessing.    FUNCTIONAL TESTS:  5 times sit to stand: TBD  GAIT: Distance walked: In clinic/ hallway Assistive device utilized: Walker - 2 wheeled Level of assistance: Modified independence Comments: Pt. Able to ambulate with moderate L antalgic gait pattern and use of RW.  Pt. Ambulates <10 feet with no assistive device and significant L knee pain/ antalgia.  Increase fall risk.    07/13/22:  L/R: knee joint line: 52 cm/ 54 cm.  Mid-gastroc (11 cm from inferior patella): 46 cm./ 49 cm.  Distal quad. (5 cm superior from patella): 54.75 cm/ 55 cm.     TODAY'S TREATMENT:                                                                                                                              DATE: 08/08/22   Subjective:  Pt. Reports 10/10 B knee pain while walking into PT clinic.  Pt. Arrived with use of RW but brought SPC to work with while in PT.  Pt. Cancelled last PT appt. Secondary to breathing issues/ panic attack.  Pt. Was diagnosed with asthma and prescribed 10 day taper of Prednisone.  O2 sat: 96%/ HR 125 bpm prior to tx. Session.    There.ex.:   Nustep L3 10 min B UE/LE .37 miles.  Discussed recent asthma symptoms/ breathing.  O2 sat: 95%  Walking in //-bars:  High knee marching 4 laps with unilateral/light.  Forward/ backwards/ lateral walking with light  UE assist for safety.  Moderate fatigue and requests seated break between forward/ lateral walking.  Alt.  UE/LE touches while walking 3 laps in //-bars.  O2 sat: 96%  6" step heel taps with  //-bars for safety (12x).   Pt. Reports  persistent knee pain.    Ball squeezes (hip adduction) in sitting 2x15 with 3 sec. Holds.  Seated LAQ 20x.    Walking in hallway clinic with [facility] (no ankle supports)- 74 feet.  Seated rest break requested in hallway due to fatigue/ knee pain.    Discussed HEP   PATIENT EDUCATION:  Education details: Gait with RW.  Access Code: [email or web address] Person educated: Patient Education method: Explanation and Demonstration Education comprehension: verbalized understanding and returned demonstration  HOME EXERCISE PROGRAM: Access Code: [email or web address] URL: [email or web address] Date: 07/13/2022 Prepared by: Dorcas Carrow  Exercises - Seated Long Arc Quad  - 2 x daily - 7 x weekly - 2 sets - 10 reps - Seated Heel Toe Raises  - 2 x daily - 7 x weekly - 2 sets - 10 reps - Standing Hip Abduction with Counter Support  - 2 x daily - 7 x weekly - 2 sets - 10 reps - Standing Hip Extension with Counter Support  - 2 x daily - 7 x weekly - 2 sets - 10 reps - Standing March with Counter Support  - 2 x daily - 7 x weekly - 2 sets - 10 reps  ASSESSMENT:  CLINICAL IMPRESSION: Pt. Worked hard during therapy showing determination for improvement. Pt. Has progressed to short distance ambulation in clinic using a SPC. Pt. required less frequent rest breaks during this session and is showing an increase in endurance. Pt. Educated on proper cane use and how to properly perform 2-point gate with a SPC.  PT. Cued pt. On maintaining an increased BOS to remain stable during gait.   No LOB but marked fatigue reported during standing there.ex./ walking.   Os sats remained >95% t/o tx. Session.  Pt. Will benefit from a consistent HEP to increase LE strength/ standing tolerance to improve functional mobility/ prepare for L TKA. Pt. Will continue to benefit from skilled PT services in order to increase  endurance, decrease pain, and increase mobility in a safe/instructive environment.   OBJECTIVE IMPAIRMENTS: Abnormal gait, decreased activity tolerance, decreased balance, decreased endurance, decreased mobility, difficulty walking, decreased ROM, decreased strength, hypomobility, increased edema, impaired flexibility, improper body mechanics, postural dysfunction, obesity, and pain.   ACTIVITY LIMITATIONS: carrying, lifting, bending, standing, squatting, stairs, transfers, and locomotion level  PARTICIPATION LIMITATIONS: cleaning, driving, shopping, and community activity  PERSONAL FACTORS: Fitness and Past/current experiences are also affecting patient's functional outcome.   REHAB POTENTIAL: Fair obesity/ B ankle joint pain/ limitations.   CLINICAL DECISION MAKING: Evolving/moderate complexity  EVALUATION COMPLEXITY: Moderate   GOALS: Goals reviewed with patient? Yes  SHORT TERM GOALS: Target date: 08/08/22 Pt. Will be independent with HEP to increase B hip/LE muscle strength to 5/5 MMT to improve standing tolerance/ mobility.  Baseline:  See above Goal status: INITIAL   LONG TERM GOALS: Target date: 10/03/22  Pt. Will increase FOTO to 49 to improve functional mobility.   Baseline: initial 35 Goal status: INITIAL  2.  Pt. Able to complete 30 minutes of there.ex. with no increase c/o L medial knee pain.   Baseline:  pt. Currently not exercising/ limited mobility.   Goal status: INITIAL  3.  Pt. Will ambulate 300 feet with least assistive device and no increase c/o L knee pain to improve pain-free mobility.   Baseline:  see above Goal status: INITIAL  PLAN:  PT FREQUENCY: 2x/week  PT DURATION: 12 weeks  PLANNED INTERVENTIONS: Therapeutic exercises, Therapeutic activity, Neuromuscular re-education, Balance training, Gait training, Patient/Family education, Self Care, Joint mobilization,  Stair training, DME instructions, Cryotherapy, Moist heat, Manual therapy, and  Re-evaluation  PLAN FOR NEXT SESSION:  ISSUE new progressive HEP/ walking endurance (3MWT or 6MWT).  Check FOTO next tx.   Pura Spice, PT, DPT # [phone] 08/08/2022, 9:52 AM

## 2022-08-10 ENCOUNTER — Ambulatory Visit: Payer: Medicare Other | Admitting: Physical Therapy

## 2022-08-10 DIAGNOSIS — R269 Unspecified abnormalities of gait and mobility: Secondary | ICD-10-CM

## 2022-08-10 DIAGNOSIS — M25562 Pain in left knee: Secondary | ICD-10-CM | POA: Diagnosis not present

## 2022-08-10 DIAGNOSIS — M25662 Stiffness of left knee, not elsewhere classified: Secondary | ICD-10-CM

## 2022-08-10 DIAGNOSIS — M6281 Muscle weakness (generalized): Secondary | ICD-10-CM

## 2022-08-10 DIAGNOSIS — G8929 Other chronic pain: Secondary | ICD-10-CM

## 2022-08-10 NOTE — Therapy (Signed)
OUTPATIENT PHYSICAL THERAPY LOWER EXTREMITY TREATMENT  Patient Name: Wyatt Hernandez MRN: [id number] DOB:04-04-1951, 72 y.o., male Today's Date: 08/10/2022   END OF SESSION:  PT End of Session - 08/10/22 0902     Visit Number 9    Number of Visits 24    Date for PT Re-Evaluation 10/03/22    PT Start Time 0902    PT Stop Time 0948    PT Time Calculation (min) 46 min    Equipment Utilized During Treatment Gait belt    Activity Tolerance Patient tolerated treatment well;Patient limited by fatigue;Patient limited by pain    Behavior During Therapy WFL for tasks assessed/performed             Past Medical History:  Diagnosis Date   Benign hypertensive heart and CKD, 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 ENDOSCOPY;  Service: Endoscopy;  Laterality: N/A;   ESOPHAGOGASTRODUODENOSCOPY (EGD) WITH PROPOFOL N/A 02/03/2017   Procedure: ESOPHAGOGASTRODUODENOSCOPY (EGD) WITH PROPOFOL;  Surgeon: Lollie Sails, MD;  Location: [facility] ENDOSCOPY;  Service: Endoscopy;  Laterality: N/A;   HERNIA REPAIR     TYMPANOPLASTY Left    Patient Active Problem List   Diagnosis Date Noted   Opiate overdose ([place])    Narcotic overdose ([place]) 03/29/2017   Coronary artery disease involving native coronary artery of native heart without angina pectoris 02/01/2017   Shortness of breath 09/01/2016   (HFpEF) heart failure with preserved ejection fraction ([place]) 09/01/2016   Hypertension 09/01/2016   Mixed hyperlipidemia 09/01/2016   Chest pain 08/15/2016    PCP: Marguerita Merles, MD  REFERRING PROVIDER: [place] Desanctis, MD  REFERRING DIAG: Unilateral primary osteoarthritis, left knee  THERAPY DIAG:   Chronic pain of left knee  Muscle weakness (generalized)  Gait difficulty  Joint stiffness of knee, left  Rationale for Evaluation and Treatment: Rehabilitation  ONSET DATE: 3 years ago.  Chronic  SUBJECTIVE:   SUBJECTIVE STATEMENT: Pt. Entered PT with c/o 8/10 L knee pain while using RW.  Pt. Reports he has to lose weight and is doing a 2,000 calorie/ day diet.  Does not believe in wt. Loss surgery.  Pt. Reports B ankles/ L knee is "bone on bone".  Pt. Wears heavy duty B ankle braces.  Goal weight 200# and is currently 338#.  Pt. unable to golf.    PERTINENT HISTORY: Pt. Had synovial injection with minimal benefit.  See MD note [facility] care everywhere).  Pt. Discussed cellulitis in R knee/lower leg while going to [employer] (hospitalized).  PAIN:  Are you having pain? Yes: NPRS scale: 8/10 Pain location: L medial knee Pain description: sharp pain Aggravating factors: increase activity/ walking Relieving factors: Meds (minimal benefit from opioids)  PRECAUTIONS: Fall  WEIGHT BEARING RESTRICTIONS: No  FALLS:  Has patient fallen in last 6 months? Yes. Number of falls 2x L knee buckle (able to return to stand independently).    LIVING ENVIRONMENT: Lives with: lives with their spouse Lives in: House/apartment Stairs: No Has following equipment at home: Gilford Rile - 2 wheeled  OCCUPATION: Retired for 12 years.    PLOF: Requires assistive device for independence  Has RW and SPC.  Has compression stockings/ ankle braces.  Occasionally uses rollator and sits on rollator.    PATIENT GOALS:  Increase mobility/ weight loss to be able to have L TKA.   NEXT MD VISIT:  June 2024  OBJECTIVE:   DIAGNOSTIC FINDINGS: See MD notes  PATIENT SURVEYS:  FOTO initial 35/ goal 28  COGNITION: Overall cognitive status: Within functional limits for tasks assessed     SENSATION: Not tested  Pt. Has heavy duty custom ankle braces on.  Pt. Will not wear next tx.    EDEMA:  Circumferential:  TBD next tx.  MUSCLE LENGTH: Hamstrings: Right 38 deg; Left 35 deg  POSTURE: rounded shoulders  PALPATION: Significant L medial knee tenderness/ no tenderness on lateral aspect of knee.      LOWER EXTREMITY ROM:  R knee 0 to 104 deg.  L knee 0 to 102 deg.  B LE are in resting ER position while lying on mat table.  LBP with hamstring stretches.    LOWER EXTREMITY MMT:   R quad/ hamstring 5/5 MMT.  L quad 5/5, hamstring 4/5 (pain).  Hip flexion 5/5 MMT.    LOWER EXTREMITY SPECIAL TESTS:  Knee special test: difficulty assessing.    FUNCTIONAL TESTS:  5 times sit to stand: TBD  GAIT: Distance walked: In clinic/ hallway Assistive device utilized: Walker - 2 wheeled Level of assistance: Modified independence Comments: Pt. Able to ambulate with moderate L antalgic gait pattern and use of RW.  Pt. Ambulates <10 feet with no assistive device and significant L knee pain/ antalgia.  Increase fall risk.    07/13/22:  L/R: knee joint line: 52 cm/ 54 cm.  Mid-gastroc (11 cm from inferior patella): 46 cm./ 49 cm.  Distal quad. (5 cm superior from patella): 54.75 cm/ 55 cm.     TODAY'S TREATMENT:                                                                                                                              DATE: 08/10/22   Subjective:  Pt. Reports 10/10 B knee pain while walking into PT clinic as well as 10/10 pain in R ankle. Pt. Arrived using SPC. Pt. Reports there are some things at home that are not okay that are affecting him emotionally. Pt. States his asthma seems to have improved since using his inhaler and recently prescribed prednisone due to a recent asthma Dx. O2 sat: 97%/ HR 97 bpm prior to tx. Session. Pt. states worse pain today because he is not taking his medication.  There.ex.:   Nustep L3 10 min B UE/LE .46 miles. Discussed recent asthma symptoms/ breathing.  O2 sat: 98% HR-114  B Seated LAQ with 5 lb. Ankle wt.'s 2x10 reps each  Alternating standing marches in  //-bars for mod. UE support 2x 30 sec to work on knee/hip flexion and endurance.  6" Alternating heel taps in //-bars for mod. UE support.  2x20 each  6" Alternating toe taps in //-bars for mod. UE support. 2x20 each  PATIENT EDUCATION:  Education details: Gait with RW.  Access Code: [email or web address] Person educated: Patient Education method: Explanation and Demonstration Education comprehension: verbalized understanding and returned demonstration  HOME EXERCISE PROGRAM: Access Code: [email or web address] URL: [email or web address] Date: 07/13/2022 Prepared by: Dorcas Carrow  Exercises - Seated Long Arc Quad  - 2 x daily - 7 x weekly - 2 sets - 10 reps - Seated Heel Toe Raises  - 2 x daily - 7 x weekly - 2 sets - 10 reps - Standing Hip Abduction with Counter Support  - 2 x daily - 7 x weekly - 2 sets - 10 reps - Standing Hip Extension with Counter Support  - 2 x daily - 7 x weekly - 2 sets - 10 reps - Standing March with Counter Support  - 2 x daily - 7 x weekly - 2 sets - 10 reps  ASSESSMENT:  CLINICAL IMPRESSION: Pt. Worked hard during therapy showing determination for improvement. Pt. Reported higher than normal pain today due to not taking his medication. Pt. Was limited by both pain and endurance during today's treatment. Pt. Required more UE support than usual due to level of pain. Pt. Has progressed to short distance ambulation in clinic using a SPC. No LOB but marked fatigue/pain reported during standing there.ex./ walking. O2 sats remained >95% t/o tx. Session.  Pt. Will benefit from a consistent HEP to increase LE strength/ standing tolerance to improve functional mobility/ prepare for L TKA. Pt. Will continue to benefit from skilled PT services in order to increase endurance, decrease pain, and increase mobility in a safe/instructive environment.   OBJECTIVE IMPAIRMENTS: Abnormal gait, decreased activity tolerance, decreased balance, decreased endurance, decreased mobility,  difficulty walking, decreased ROM, decreased strength, hypomobility, increased edema, impaired flexibility, improper body mechanics, postural dysfunction, obesity, and pain.   ACTIVITY LIMITATIONS: carrying, lifting, bending, standing, squatting, stairs, transfers, and locomotion level  PARTICIPATION LIMITATIONS: cleaning, driving, shopping, and community activity  PERSONAL FACTORS: Fitness and Past/current experiences are also affecting patient's functional outcome.   REHAB POTENTIAL: Fair obesity/ B ankle joint pain/ limitations.   CLINICAL DECISION MAKING: Evolving/moderate complexity  EVALUATION COMPLEXITY: Moderate   GOALS: Goals reviewed with patient? Yes  SHORT TERM GOALS: Target date: 08/08/22 Pt. Will be independent with HEP to increase B hip/LE muscle strength to 5/5 MMT to improve standing tolerance/ mobility.  Baseline:  See above Goal status: On-going   LONG TERM GOALS: Target date: 10/03/22  Pt. Will increase FOTO to 49 to improve functional mobility.   Baseline: initial 35 Goal status: INITIAL  2.  Pt. Able to complete 30 minutes of there.ex. with no increase c/o L medial knee pain.   Baseline:  pt. Currently not exercising/ limited mobility.   Goal status: INITIAL  3.  Pt. Will ambulate 300 feet with least assistive device and no increase c/o L knee pain to improve pain-free mobility.   Baseline:  see above Goal status: INITIAL  PLAN:  PT FREQUENCY: 2x/week  PT DURATION: 12 weeks  PLANNED INTERVENTIONS: Therapeutic exercises, Therapeutic activity, Neuromuscular re-education, Balance training, Gait training, Patient/Family education, Self Care, Joint mobilization, Stair training, DME instructions, Cryotherapy, Moist heat, Manual therapy, and Re-evaluation  PLAN FOR NEXT SESSION:  ISSUE new progressive HEP. Check FOTO next tx./ 10th visit progress note    Sharla Tankard B. Rogers Blocker, SPT Pura Spice, PT, DPT # [phone] 08/10/2022, 2:24 PM

## 2022-08-15 ENCOUNTER — Ambulatory Visit: Payer: Medicare Other | Admitting: Physical Therapy

## 2022-08-17 ENCOUNTER — Encounter: Payer: Self-pay | Admitting: Physical Therapy

## 2022-08-17 ENCOUNTER — Ambulatory Visit: Payer: Medicare Other | Admitting: Physical Therapy

## 2022-08-17 DIAGNOSIS — G8929 Other chronic pain: Secondary | ICD-10-CM

## 2022-08-17 DIAGNOSIS — R269 Unspecified abnormalities of gait and mobility: Secondary | ICD-10-CM

## 2022-08-17 DIAGNOSIS — M25562 Pain in left knee: Secondary | ICD-10-CM | POA: Diagnosis not present

## 2022-08-17 DIAGNOSIS — M6281 Muscle weakness (generalized): Secondary | ICD-10-CM

## 2022-08-17 DIAGNOSIS — M25662 Stiffness of left knee, not elsewhere classified: Secondary | ICD-10-CM

## 2022-08-17 NOTE — Therapy (Signed)
OUTPATIENT PHYSICAL THERAPY LOWER EXTREMITY TREATMENT Progress note: 07/11/22 to 08/17/22  Patient Name: Wyatt Hernandez MRN: [id number] DOB:05/29/1951, 72 y.o., male Today's Date: 08/17/2022   END OF SESSION:  PT End of Session - 08/17/22 0902     Visit Number 10    Number of Visits 24    Date for PT Re-Evaluation 10/03/22    PT Start Time 0857    PT Stop Time 0957    PT Time Calculation (min) 60 min    Equipment Utilized During Treatment Gait belt    Activity Tolerance Patient tolerated treatment well    Behavior During Therapy WFL for tasks assessed/performed             Past Medical History:  Diagnosis Date   Benign hypertensive heart and CKD, 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), w CHF ([place])    CHF (congestive heart failure) ([place])    Gout    Hypertension    Renal disorder    Sleep apnea    Vitamin D deficiency    Past Surgical History:  Procedure Laterality Date   COLONOSCOPY WITH PROPOFOL N/A 02/03/2017   Procedure: COLONOSCOPY WITH PROPOFOL;  Surgeon: Lollie Sails, MD;  Location: [facility] ENDOSCOPY;  Service: Endoscopy;  Laterality: N/A;   ESOPHAGOGASTRODUODENOSCOPY (EGD) WITH PROPOFOL N/A 02/03/2017   Procedure: ESOPHAGOGASTRODUODENOSCOPY (EGD) WITH PROPOFOL;  Surgeon: Lollie Sails, MD;  Location: [facility] ENDOSCOPY;  Service: Endoscopy;  Laterality: N/A;   HERNIA REPAIR     TYMPANOPLASTY Left    Patient Active Problem List   Diagnosis Date Noted   Opiate overdose ([place])    Narcotic overdose ([place]) 03/29/2017   Coronary artery disease involving native coronary artery of native heart without angina pectoris 02/01/2017   Shortness of breath 09/01/2016   (HFpEF) heart failure with preserved ejection fraction ([place]) 09/01/2016   Hypertension 09/01/2016   Mixed hyperlipidemia 09/01/2016   Chest pain 08/15/2016    PCP: Marguerita Merles, MD  REFERRING PROVIDER: [place] Desanctis, MD  REFERRING DIAG: Unilateral primary osteoarthritis, left knee  THERAPY DIAG:  Stiffness of  left knee, not elsewhere classified  Chronic pain of left knee  Muscle weakness (generalized)  Gait difficulty  Joint stiffness of knee, left  Rationale for Evaluation and Treatment: Rehabilitation  ONSET DATE: 3 years ago.  Chronic  SUBJECTIVE:   SUBJECTIVE STATEMENT: Pt. Entered PT with c/o 8/10 L knee pain while using RW.  Pt. Reports he has to lose weight and is doing a 2,000 calorie/ day diet.  Does not believe in wt. Loss surgery.  Pt. Reports B ankles/ L knee is "bone on bone".  Pt. Wears heavy duty B ankle braces.  Goal weight 200# and is currently 338#.  Pt. unable to golf.    PERTINENT HISTORY: Pt. Had synovial injection with minimal benefit.  See MD note [facility] care everywhere).  Pt. Discussed cellulitis in R knee/lower leg while going to [employer] (hospitalized).  PAIN:  Are you having pain? Yes: NPRS scale: 8/10 Pain location: L medial knee Pain description: sharp pain Aggravating factors: increase activity/ walking Relieving factors: Meds (minimal benefit from opioids)  PRECAUTIONS: Fall  WEIGHT BEARING RESTRICTIONS: No  FALLS:  Has patient fallen in last 6 months? Yes. Number of falls 2x L knee buckle (able to return to stand independently).    LIVING ENVIRONMENT: Lives with: lives with their spouse Lives in: House/apartment Stairs: No Has following equipment at home: Gilford Rile - 2 wheeled  OCCUPATION: Retired for 12 years.  PLOF: Requires assistive device for independence  Has RW and SPC.  Has compression stockings/ ankle braces.  Occasionally uses rollator and sits on rollator.    PATIENT GOALS:  Increase mobility/ weight loss to be able to have L TKA.   NEXT MD VISIT:  June 2024  OBJECTIVE:   DIAGNOSTIC FINDINGS: See MD notes  PATIENT SURVEYS:  FOTO initial 35/ goal 79  COGNITION: Overall cognitive status: Within functional limits for tasks assessed     SENSATION: Not tested  Pt. Has heavy duty custom ankle braces on.  Pt. Will not wear  next tx.    EDEMA:  Circumferential: TBD next tx.  MUSCLE LENGTH: Hamstrings: Right 38 deg; Left 35 deg  POSTURE: rounded shoulders  PALPATION: Significant L medial knee tenderness/ no tenderness on lateral aspect of knee.      LOWER EXTREMITY ROM:  R knee 0 to 104 deg.  L knee 0 to 102 deg.  B LE are in resting ER position while lying on mat table.  LBP with hamstring stretches.    LOWER EXTREMITY MMT:   R quad/ hamstring 5/5 MMT.  L quad 5/5, hamstring 4/5 (pain).  Hip flexion 5/5 MMT.    LOWER EXTREMITY SPECIAL TESTS:  Knee special test: difficulty assessing.    FUNCTIONAL TESTS:  5 times sit to stand: TBD  GAIT: Distance walked: In clinic/ hallway Assistive device utilized: Walker - 2 wheeled Level of assistance: Modified independence Comments: Pt. Able to ambulate with moderate L antalgic gait pattern and use of RW.  Pt. Ambulates <10 feet with no assistive device and significant L knee pain/ antalgia.  Increase fall risk.    07/13/22:  L/R: knee joint line: 52 cm/ 54 cm.  Mid-gastroc (11 cm from inferior patella): 46 cm./ 49 cm.  Distal quad. (5 cm superior from patella): 54.75 cm/ 55 cm.     TODAY'S TREATMENT:                                                                                                                              DATE: 08/17/22   Subjective:  Pt. Reports 7.5/10 B knee pain while walking into PT clinic as well as 10/10 pain in B ankles. Pt. Arrived using SPC.  Pt. States his asthma seems to have improved since using his inhaler and recently prescribed prednisone due to a recent asthma Dx. O2 sat: 97%/ HR 97 bpm prior to tx. Session. Pt. states he was sick last week due to stopping pain meds cold [place]. Pt. States he is currently back on his meds. Pt. Was able to spend some time outside yesterday and did so without his braces and with a SPC.   There.ex.:   Nustep L5 11 min B UE/LE .35 miles. Discussed recent asthma symptoms/ breathing.  O2 sat: 97%  HR-110  Walking in hallway: 340 ft. 4 rest breaks required due to intensity of knee and ankle pain.   Alternating standing  marches in //-bars for mod. UE support 2x 30 sec to work on knee/hip flexion and endurance. Pt. Used one hand for UE support. 3 laps.   Lat. Stepping in //-bars for UE support. Pt. Used 1 hand for UE support. 3 laps.   B standing hip abduction in //-bars for UE support. 2x15 each leg.   6" Alternating heel taps in //-bars for mod. UE support.  2x20 each leg to increase LE endurance.   UPDATED [id number]:[id number].   PATIENT EDUCATION:  Education details: Gait with RW.  Access Code: [email or web address] Person educated: Patient Education method: Explanation and Demonstration Education comprehension: verbalized understanding and returned demonstration  HOME EXERCISE PROGRAM: Access Code: [email or web address] URL: [email or web address] Date: 07/13/2022 Prepared by: Dorcas Carrow  Exercises - Seated Long Arc Quad  - 2 x daily - 7 x weekly - 2 sets - 10 reps - Seated Heel Toe Raises  - 2 x daily - 7 x weekly - 2 sets - 10 reps - Standing Hip Abduction with Counter Support  - 2 x daily - 7 x weekly - 2 sets - 10 reps - Standing Hip Extension with Counter Support  - 2 x daily - 7 x weekly - 2 sets - 10 reps - Standing March with Counter Support  - 2 x daily - 7 x weekly - 2 sets - 10 reps  ASSESSMENT:  CLINICAL IMPRESSION: Pt. Worked hard during therapy showing determination for improvement.  Pt. Has progressed to longer distance ambulation in clinic using a SPC and is able to walk short distances without the use of an assistive device. Moderate antalgic L LE gait is present when pt. Ambulates without an assistive device. PT re-assessed pt. Goals today. PT re-administered FOTO test and pt. Scored 41/49 showing a 6 point improvement in overall knee function. Pt. Has made improvements in endurance and is starting to trust his knee for weight bearing/ambulation during longer distances.  Pt. Has partially met all of his goals and has had shown improvements in endurance, strength, and has almost completely transitioned into using a SPC for all activities instead of a walker. PT. Encouraged pt. To use SPC for longer distances and when ambulating throughout the community but approves gait without an assistive device for very short distances in home when pt.s Pain score is low. Pt. Completed all gait and there ex. Activities without any instances of LOB. O2 sats remained >95% t/o tx. Session.  Pt. Will benefit from a consistent HEP to increase LE strength/ standing tolerance to improve functional mobility/ prepare for L TKA. Pt. Will continue to benefit from skilled PT services in order to increase endurance, decrease pain, and increase mobility in a safe/instructive environment.   OBJECTIVE IMPAIRMENTS: Abnormal gait, decreased activity tolerance, decreased balance, decreased endurance, decreased mobility, difficulty walking, decreased ROM, decreased strength, hypomobility, increased edema, impaired flexibility, improper body mechanics, postural dysfunction, obesity, and pain.   ACTIVITY LIMITATIONS: carrying, lifting, bending, standing, squatting, stairs, transfers, and locomotion level  PARTICIPATION LIMITATIONS: cleaning, driving, shopping, and community activity  PERSONAL FACTORS: Fitness and Past/current experiences are also affecting patient's functional outcome.   REHAB POTENTIAL: Fair obesity/ B ankle joint pain/ limitations.   CLINICAL DECISION MAKING: Evolving/moderate complexity  EVALUATION COMPLEXITY: Moderate   GOALS: Goals reviewed with patient? Yes  SHORT TERM GOALS: Target date: 08/08/22 Pt. Will be independent with HEP to increase B hip/LE muscle strength to 5/5 MMT to improve standing tolerance/ mobility.  Baseline:  See above Goal status: Partially met  LONG TERM GOALS: Target date: 10/03/22  Pt. Will increase FOTO to 49 to improve functional mobility.    Baseline: initial 35.  2/14: 41 Goal status: Partially met  2.  Pt. Able to complete 30 minutes of there.ex. with no increase c/o L medial knee pain.   Baseline:  pt. Currently not exercising/ limited mobility.   Goal status: Partially met  3.  Pt. Will ambulate 300 feet with least assistive device and no increase c/o L knee pain to improve pain-free mobility.   Baseline:  see above Goal status: Partially met (2/14).   PLAN:  PT FREQUENCY: 2x/week  PT DURATION: 12 weeks  PLANNED INTERVENTIONS: Therapeutic exercises, Therapeutic activity, Neuromuscular re-education, Balance training, Gait training, Patient/Family education, Self Care, Joint mobilization, Stair training, DME instructions, Cryotherapy, Moist heat, Manual therapy, and Re-evaluation  PLAN FOR NEXT SESSION:  ISSUE new progressive HEP. Strength testing.    Sharief Wainwright B. Rogers Blocker, SPT Pura Spice, PT, DPT # [phone] 08/17/2022, 10:12 AM

## 2022-08-19 DIAGNOSIS — E669 Obesity, unspecified: Principal | ICD-10-CM

## 2022-08-22 ENCOUNTER — Ambulatory Visit
Admit: 2022-08-22 | Discharge: 2022-08-23 | Payer: MEDICARE | Attending: Student in an Organized Health Care Education/Training Program | Primary: Student in an Organized Health Care Education/Training Program

## 2022-08-22 DIAGNOSIS — M109 Gout, unspecified: Principal | ICD-10-CM

## 2022-08-24 ENCOUNTER — Ambulatory Visit: Admit: 2022-08-24 | Discharge: 2022-08-24 | Payer: MEDICARE

## 2022-08-24 ENCOUNTER — Encounter
Admit: 2022-08-24 | Discharge: 2022-08-24 | Payer: MEDICARE | Attending: Certified Registered" | Primary: Certified Registered"

## 2022-08-24 MED ORDER — PREDNISOLONE ACETATE 1 % EYE DROPS,SUSPENSION
Freq: Four times a day (QID) | OPHTHALMIC | 0 refills | 25 days | Status: CP
Start: 2022-08-24 — End: ?

## 2022-08-24 MED ORDER — KETOROLAC 0.5 % EYE DROPS
Freq: Four times a day (QID) | OPHTHALMIC | 0 refills | 25 days | Status: CP
Start: 2022-08-24 — End: ?

## 2022-08-24 MED ORDER — MOXIFLOXACIN 0.5 % EYE DROPS
Freq: Four times a day (QID) | OPHTHALMIC | 0 refills | 15 days | Status: CP
Start: 2022-08-24 — End: ?

## 2022-08-25 ENCOUNTER — Ambulatory Visit
Admit: 2022-08-25 | Discharge: 2022-08-26 | Payer: MEDICARE | Attending: Student in an Organized Health Care Education/Training Program | Primary: Student in an Organized Health Care Education/Training Program

## 2022-08-25 DIAGNOSIS — H402233 Chronic angle-closure glaucoma, bilateral, severe stage: Principal | ICD-10-CM

## 2022-08-25 DIAGNOSIS — Z961 Presence of intraocular lens: Principal | ICD-10-CM

## 2022-08-25 DIAGNOSIS — Z9841 Cataract extraction status, right eye: Principal | ICD-10-CM

## 2022-08-29 ENCOUNTER — Telehealth: Payer: Self-pay

## 2022-08-29 ENCOUNTER — Ambulatory Visit: Payer: Medicare Other | Admitting: Physical Therapy

## 2022-08-29 ENCOUNTER — Encounter: Payer: Self-pay | Admitting: Physical Therapy

## 2022-08-29 DIAGNOSIS — M25562 Pain in left knee: Secondary | ICD-10-CM | POA: Diagnosis not present

## 2022-08-29 DIAGNOSIS — M25662 Stiffness of left knee, not elsewhere classified: Secondary | ICD-10-CM

## 2022-08-29 DIAGNOSIS — M6281 Muscle weakness (generalized): Secondary | ICD-10-CM

## 2022-08-29 DIAGNOSIS — R269 Unspecified abnormalities of gait and mobility: Secondary | ICD-10-CM

## 2022-08-29 DIAGNOSIS — G8929 Other chronic pain: Secondary | ICD-10-CM

## 2022-08-29 MED ORDER — DIAZEPAM 10 MG PO TABS: 10.0000 mg | ORAL_TABLET | Freq: Once | ORAL | 0 refills | Status: AC

## 2022-08-29 NOTE — Telephone Encounter (Signed)
Patient left a message on triage line stating he has MRI scheduled for tomorrow 08/30/22 at 10 am. He is a little claustrophobic and has back issues from laying in certain positions for a longer time frame. He wanted to ask for a mild sedative to help him to take tomorrow. Please advise.  Pharmacy [facility].

## 2022-08-29 NOTE — Telephone Encounter (Signed)
Valium sent to pharmacy.  Take 1 hour prior to the appointment.  Must have driver.  Hollice Espy, MD

## 2022-08-29 NOTE — Therapy (Signed)
OUTPATIENT PHYSICAL THERAPY LOWER EXTREMITY TREATMENT  Patient Name: Wyatt Hernandez MRN: [id number] DOB:1950-10-15, 72 y.o., male Today's Date: 08/30/2022   END OF SESSION:  PT End of Session - 08/29/22 0911     Visit Number 11    Number of Visits 24    Date for PT Re-Evaluation 10/03/22    PT Start Time 0904    PT Stop Time 0948    PT Time Calculation (min) 44 min    Equipment Utilized During Treatment Gait belt    Activity Tolerance Patient tolerated treatment well    Behavior During Therapy WFL for tasks assessed/performed             Past Medical History:  Diagnosis Date   Benign hypertensive heart and CKD, 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), w CHF ([place])    CHF (congestive heart failure) ([place])    Gout    Hypertension    Renal disorder    Sleep apnea    Vitamin D deficiency    Past Surgical History:  Procedure Laterality Date   COLONOSCOPY WITH PROPOFOL N/A 02/03/2017   Procedure: COLONOSCOPY WITH PROPOFOL;  Surgeon: Lollie Sails, MD;  Location: [facility] ENDOSCOPY;  Service: Endoscopy;  Laterality: N/A;   ESOPHAGOGASTRODUODENOSCOPY (EGD) WITH PROPOFOL N/A 02/03/2017   Procedure: ESOPHAGOGASTRODUODENOSCOPY (EGD) WITH PROPOFOL;  Surgeon: Lollie Sails, MD;  Location: [facility] ENDOSCOPY;  Service: Endoscopy;  Laterality: N/A;   HERNIA REPAIR     TYMPANOPLASTY Left    Patient Active Problem List   Diagnosis Date Noted   Opiate overdose ([place])    Narcotic overdose ([place]) 03/29/2017   Coronary artery disease involving native coronary artery of native heart without angina pectoris 02/01/2017   Shortness of breath 09/01/2016   (HFpEF) heart failure with preserved ejection fraction ([place]) 09/01/2016   Hypertension 09/01/2016   Mixed hyperlipidemia 09/01/2016   Chest pain 08/15/2016    PCP: Marguerita Merles, MD  REFERRING PROVIDER: [place] Desanctis, MD  REFERRING DIAG: Unilateral primary osteoarthritis, left knee  THERAPY DIAG:  Chronic pain of left knee  Stiffness of left  knee, not elsewhere classified  Muscle weakness (generalized)  Gait difficulty  Joint stiffness of knee, left  Stiffness of knee joint, left  Rationale for Evaluation and Treatment: Rehabilitation  ONSET DATE: 3 years ago.  Chronic  SUBJECTIVE:   SUBJECTIVE STATEMENT: Pt. Entered PT with c/o 8/10 L knee pain while using RW.  Pt. Reports he has to lose weight and is doing a 2,000 calorie/ day diet.  Does not believe in wt. Loss surgery.  Pt. Reports B ankles/ L knee is "bone on bone".  Pt. Wears heavy duty B ankle braces.  Goal weight 200# and is currently 338#.  Pt. unable to golf.    PERTINENT HISTORY: Pt. Had synovial injection with minimal benefit.  See MD note [facility] care everywhere).  Pt. Discussed cellulitis in R knee/lower leg while going to [employer] (hospitalized).  PAIN:  Are you having pain? Yes: NPRS scale: 8/10 Pain location: L medial knee Pain description: sharp pain Aggravating factors: increase activity/ walking Relieving factors: Meds (minimal benefit from opioids)  PRECAUTIONS: Fall  WEIGHT BEARING RESTRICTIONS: No  FALLS:  Has patient fallen in last 6 months? Yes. Number of falls 2x L knee buckle (able to return to stand independently).    LIVING ENVIRONMENT: Lives with: lives with their spouse Lives in: House/apartment Stairs: No Has following equipment at home: Gilford Rile - 2 wheeled  OCCUPATION: Retired for 12 years.  PLOF: Requires assistive device for independence  Has RW and SPC.  Has compression stockings/ ankle braces.  Occasionally uses rollator and sits on rollator.    PATIENT GOALS:  Increase mobility/ weight loss to be able to have L TKA.   NEXT MD VISIT:  June 2024  OBJECTIVE:   DIAGNOSTIC FINDINGS: See MD notes  PATIENT SURVEYS:  FOTO initial 35/ goal 63  COGNITION: Overall cognitive status: Within functional limits for tasks assessed     SENSATION: Not tested  Pt. Has heavy duty custom ankle braces on.  Pt. Will not wear next  tx.    EDEMA:  Circumferential: TBD next tx.  MUSCLE LENGTH: Hamstrings: Right 38 deg; Left 35 deg  POSTURE: rounded shoulders  PALPATION: Significant L medial knee tenderness/ no tenderness on lateral aspect of knee.      LOWER EXTREMITY ROM:  R knee 0 to 104 deg.  L knee 0 to 102 deg.  B LE are in resting ER position while lying on mat table.  LBP with hamstring stretches.    LOWER EXTREMITY MMT:   R quad/ hamstring 5/5 MMT.  L quad 5/5, hamstring 4/5 (pain).  Hip flexion 5/5 MMT.    LOWER EXTREMITY SPECIAL TESTS:  Knee special test: difficulty assessing.    FUNCTIONAL TESTS:  5 times sit to stand: TBD  GAIT: Distance walked: In clinic/ hallway Assistive device utilized: Walker - 2 wheeled Level of assistance: Modified independence Comments: Pt. Able to ambulate with moderate L antalgic gait pattern and use of RW.  Pt. Ambulates <10 feet with no assistive device and significant L knee pain/ antalgia.  Increase fall risk.    07/13/22:  L/R: knee joint line: 52 cm/ 54 cm.  Mid-gastroc (11 cm from inferior patella): 46 cm./ 49 cm.  Distal quad. (5 cm superior from patella): 54.75 cm/ 55 cm.    UPDATED FOTOUJ:[id number].  TODAY'S TREATMENT:                                                                                                                              DATE: 08/29/22   Subjective:  Pt. Reports 8/10 B knee pain while walking into PT clinic as well as 9/10 pain in B ankles.  Pt. Wearing B ankle braces due to ankle pain over the weekend.  Pt. Had eye surgery last week with a small complication reported (bubble in eye which was released the following day).  Pt. Arrived using SPC.  Pt. Has MRI scheduled tomorrow to assess prostate.  Pt. States he did some walking in house this past weekend but minimal exercises secondary to eye surgery and feeling pressure when bending forward.    There.ex.:   Nustep L5 10 min B UE/LE 0.34 miles. Discussed recent asthma symptoms/ breathing.  O2  sat: 98% HR-117 bpm.  Alternating standing marches in //-bars for mod. UE support 2x 30 sec to work on knee/hip flexion and endurance.  Pharmacist, hospital  for posture/ cuing to control breathing.   Lat. Stepping in //-bars with light UE support. Pt. Used 1 hand for UE support. 5 laps. Seated rest break.  B standing hip abduction in //-bars for UE support. 2x15 each leg.   6" Alternating heel taps in //-bars for mod. UE support.  2x20 each leg to increase LE endurance. Pharmacist, hospital.  Walking in hallway with use of SPC working on step pattern/ length with several short seated rest breaks required due to intensity of knee and ankle pain. Pt. Wearing B ankle braces today.      PATIENT EDUCATION:  Education details: Gait with RW.  Access Code: [email or web address] Person educated: Patient Education method: Explanation and Demonstration Education comprehension: verbalized understanding and returned demonstration  HOME EXERCISE PROGRAM: Access Code: [email or web address] URL: [email or web address] Date: 07/13/2022 Prepared by: Dorcas Carrow  Exercises - Seated Long Arc Quad  - 2 x daily - 7 x weekly - 2 sets - 10 reps - Seated Heel Toe Raises  - 2 x daily - 7 x weekly - 2 sets - 10 reps - Standing Hip Abduction with Counter Support  - 2 x daily - 7 x weekly - 2 sets - 10 reps - Standing Hip Extension with Counter Support  - 2 x daily - 7 x weekly - 2 sets - 10 reps - Standing March with Counter Support  - 2 x daily - 7 x weekly - 2 sets - 10 reps  ASSESSMENT:  CLINICAL IMPRESSION: Pt. Worked hard during therapy showing determination for improvement.  Pt. Has progressed to longer distance ambulation in clinic using a SPC and is able to walk short distances without the use of an assistive device. Moderate antalgic L LE gait is present when pt. Ambulates without an assistive device. PT re-assessed pt. Goals today.  Pt. Has made improvements in endurance and is starting to trust his knee for weight  bearing/ambulation during longer distances. Pt. Has partially met all of his goals and has had shown improvements in endurance, strength, and has almost completely transitioned into using a SPC for all activities instead of a walker. PT. Encouraged pt. To use SPC for longer distances and when ambulating throughout the community but approves gait without an assistive device for very short distances in home when pt.s Pain score is low. Pt. Completed all gait and there ex. Activities without any instances of LOB. O2 sats remained >95% t/o tx. Session.  Pt. Will benefit from a consistent HEP to increase LE strength/ standing tolerance to improve functional mobility/ prepare for L TKA. Pt. Will continue to benefit from skilled PT services in order to increase endurance, decrease pain, and increase mobility in a safe/instructive environment.   OBJECTIVE IMPAIRMENTS: Abnormal gait, decreased activity tolerance, decreased balance, decreased endurance, decreased mobility, difficulty walking, decreased ROM, decreased strength, hypomobility, increased edema, impaired flexibility, improper body mechanics, postural dysfunction, obesity, and pain.   ACTIVITY LIMITATIONS: carrying, lifting, bending, standing, squatting, stairs, transfers, and locomotion level  PARTICIPATION LIMITATIONS: cleaning, driving, shopping, and community activity  PERSONAL FACTORS: Fitness and Past/current experiences are also affecting patient's functional outcome.   REHAB POTENTIAL: Fair obesity/ B ankle joint pain/ limitations.   CLINICAL DECISION MAKING: Evolving/moderate complexity  EVALUATION COMPLEXITY: Moderate   GOALS: Goals reviewed with patient? Yes  SHORT TERM GOALS: Target date: 08/08/22 Pt. Will be independent with HEP to increase B hip/LE muscle strength to 5/5 MMT to improve standing tolerance/ mobility.  Baseline:  See above Goal status: Partially met  LONG TERM GOALS: Target date: 10/03/22  Pt. Will increase FOTO  to 49 to improve functional mobility.   Baseline: initial 35.  2/14: 41 Goal status: Partially met  2.  Pt. Able to complete 30 minutes of there.ex. with no increase c/o L medial knee pain.   Baseline:  pt. Currently not exercising/ limited mobility.   Goal status: Partially met  3.  Pt. Will ambulate 300 feet with least assistive device and no increase c/o L knee pain to improve pain-free mobility.   Baseline:  see above Goal status: Partially met (2/14).   PLAN:  PT FREQUENCY: 2x/week  PT DURATION: 12 weeks  PLANNED INTERVENTIONS: Therapeutic exercises, Therapeutic activity, Neuromuscular re-education, Balance training, Gait training, Patient/Family education, Self Care, Joint mobilization, Stair training, DME instructions, Cryotherapy, Moist heat, Manual therapy, and Re-evaluation  PLAN FOR NEXT SESSION:  ISSUE new progressive HEP. Strength testing.    Ashlyn B. Rogers Blocker, SPT Pura Spice, PT, DPT # [phone] 08/30/2022, 8:29 AM

## 2022-08-29 NOTE — Telephone Encounter (Signed)
Patient advised.

## 2022-08-30 ENCOUNTER — Ambulatory Visit
Admission: RE | Admit: 2022-08-30 | Discharge: 2022-08-30 | Disposition: A | Discharge status: Home / Self Care | Payer: Medicare Other | Source: Ambulatory Visit | Attending: Urology | Admitting: Urology

## 2022-08-30 DIAGNOSIS — R972 Elevated prostate specific antigen [PSA]: Secondary | ICD-10-CM | POA: Insufficient documentation

## 2022-08-30 MED ORDER — GADOBUTROL 1 MMOL/ML IV SOLN
10.0000 mL | Freq: Once | INTRAVENOUS | Status: AC | PRN
  Administered 2022-08-30: 10 mL via INTRAVENOUS

## 2022-08-31 ENCOUNTER — Ambulatory Visit: Payer: Medicare Other | Admitting: Physical Therapy

## 2022-08-31 DIAGNOSIS — M6281 Muscle weakness (generalized): Secondary | ICD-10-CM

## 2022-08-31 DIAGNOSIS — G8929 Other chronic pain: Secondary | ICD-10-CM

## 2022-08-31 DIAGNOSIS — R269 Unspecified abnormalities of gait and mobility: Secondary | ICD-10-CM

## 2022-08-31 DIAGNOSIS — M25662 Stiffness of left knee, not elsewhere classified: Secondary | ICD-10-CM

## 2022-08-31 DIAGNOSIS — M25562 Pain in left knee: Secondary | ICD-10-CM | POA: Diagnosis not present

## 2022-08-31 NOTE — Therapy (Signed)
OUTPATIENT PHYSICAL THERAPY LOWER EXTREMITY TREATMENT  Patient Name: Wyatt Hernandez MRN: [id number] DOB:[birth year], 72 y.o., male Today's Date: 08/31/2022   END OF SESSION:  PT End of Session - 08/31/22 0852     Visit Number 12    Number of Visits 24    Date for PT Re-Evaluation 10/03/22    PT Start Time 0852    PT Stop Time 0945    PT Time Calculation (min) 53 min    Equipment Utilized During Treatment Gait belt    Activity Tolerance Patient tolerated treatment well    Behavior During Therapy WFL for tasks assessed/performed             Past Medical History:  Diagnosis Date   Benign hypertensive heart and CKD, 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), w CHF ([place])    CHF (congestive heart failure) ([place])    Gout    Hypertension    Renal disorder    Sleep apnea    Vitamin D deficiency    Past Surgical History:  Procedure Laterality Date   COLONOSCOPY WITH PROPOFOL N/A 02/03/2017   Procedure: COLONOSCOPY WITH PROPOFOL;  Surgeon: Lollie Sails, MD;  Location: [facility] ENDOSCOPY;  Service: Endoscopy;  Laterality: N/A;   ESOPHAGOGASTRODUODENOSCOPY (EGD) WITH PROPOFOL N/A 02/03/2017   Procedure: ESOPHAGOGASTRODUODENOSCOPY (EGD) WITH PROPOFOL;  Surgeon: Lollie Sails, MD;  Location: [facility] ENDOSCOPY;  Service: Endoscopy;  Laterality: N/A;   HERNIA REPAIR     TYMPANOPLASTY Left    Patient Active Problem List   Diagnosis Date Noted   Opiate overdose ([place])    Narcotic overdose ([place]) 03/29/2017   Coronary artery disease involving native coronary artery of native heart without angina pectoris 02/01/2017   Shortness of breath 09/01/2016   (HFpEF) heart failure with preserved ejection fraction ([place]) 09/01/2016   Hypertension 09/01/2016   Mixed hyperlipidemia 09/01/2016   Chest pain 08/15/2016    PCP: Marguerita Merles, MD  REFERRING PROVIDER: [place] Desanctis, MD  REFERRING DIAG: Unilateral primary osteoarthritis, left knee  THERAPY DIAG:  Chronic pain of left knee  Stiffness of left  knee, not elsewhere classified  Muscle weakness (generalized)  Gait difficulty  Joint stiffness of knee, left  Stiffness of knee joint, left  Rationale for Evaluation and Treatment: Rehabilitation  ONSET DATE: 3 years ago.  Chronic  SUBJECTIVE:   SUBJECTIVE STATEMENT: Pt. Entered PT with c/o 8/10 L knee pain while using RW.  Pt. Reports he has to lose weight and is doing a 2,000 calorie/ day diet.  Does not believe in wt. Loss surgery.  Pt. Reports B ankles/ L knee is "bone on bone".  Pt. Wears heavy duty B ankle braces.  Goal weight 200# and is currently 338#.  Pt. unable to golf.    PERTINENT HISTORY: Pt. Had synovial injection with minimal benefit.  See MD note [facility] care everywhere).  Pt. Discussed cellulitis in R knee/lower leg while going to [employer] (hospitalized).  PAIN:  Are you having pain? Yes: NPRS scale: 8/10 Pain location: L medial knee Pain description: sharp pain Aggravating factors: increase activity/ walking Relieving factors: Meds (minimal benefit from opioids)  PRECAUTIONS: Fall  WEIGHT BEARING RESTRICTIONS: No  FALLS:  Has patient fallen in last 6 months? Yes. Number of falls 2x L knee buckle (able to return to stand independently).    LIVING ENVIRONMENT: Lives with: lives with their spouse Lives in: House/apartment Stairs: No Has following equipment at home: Gilford Rile - 2 wheeled  OCCUPATION: Retired for 12 years.  PLOF: Requires assistive device for independence  Has RW and SPC.  Has compression stockings/ ankle braces.  Occasionally uses rollator and sits on rollator.    PATIENT GOALS:  Increase mobility/ weight loss to be able to have L TKA.   NEXT MD VISIT:  June 2024  OBJECTIVE:   DIAGNOSTIC FINDINGS: See MD notes  PATIENT SURVEYS:  FOTO initial 35/ goal 10  COGNITION: Overall cognitive status: Within functional limits for tasks assessed     SENSATION: Not tested  Pt. Has heavy duty custom ankle braces on.  Pt. Will not wear next  tx.    EDEMA:  Circumferential: TBD next tx.  MUSCLE LENGTH: Hamstrings: Right 38 deg; Left 35 deg  POSTURE: rounded shoulders  PALPATION: Significant L medial knee tenderness/ no tenderness on lateral aspect of knee.      LOWER EXTREMITY ROM:  R knee 0 to 104 deg.  L knee 0 to 102 deg.  B LE are in resting ER position while lying on mat table.  LBP with hamstring stretches.    LOWER EXTREMITY MMT:   R quad/ hamstring 5/5 MMT.  L quad 5/5, hamstring 4/5 (pain).  Hip flexion 5/5 MMT.    LOWER EXTREMITY SPECIAL TESTS:  Knee special test: difficulty assessing.    FUNCTIONAL TESTS:  5 times sit to stand: TBD  GAIT: Distance walked: In clinic/ hallway Assistive device utilized: Walker - 2 wheeled Level of assistance: Modified independence Comments: Pt. Able to ambulate with moderate L antalgic gait pattern and use of RW.  Pt. Ambulates <10 feet with no assistive device and significant L knee pain/ antalgia.  Increase fall risk.    07/13/22:  L/R: knee joint line: 52 cm/ 54 cm.  Mid-gastroc (11 cm from inferior patella): 46 cm./ 49 cm.  Distal quad. (5 cm superior from patella): 54.75 cm/ 55 cm.    UPDATED [id number].  TODAY'S TREATMENT:                                                                                                                              DATE: 08/31/22   8/10 L knee and R ankle.    Subjective:  Pt. Reports 8/10 L knee and R ankle pain while walking into PT gym.  Pt. Had difficulty walking into diagnostic imaging building yesterday without use of ankle braces.  Pt. States he was really tired and has not gotten his results from MRI at this time.  Pt. Returns to eye surgeon tomorrow for f/u.    There.ex.:   Forward/backwards/lateral walking in //-bars 4 laps (no UE assist with forward/backwards but requires for lateral walking secondary to fatigue/L knee and R ankle pain).    Nustep L5 10 min B UE/LE 0.34 miles. Discussed recent asthma symptoms/ breathing.   O2 sat: 98% HR-115 bpm.  5xSTS: 8.22 seconds.  Walking in hallway with use of [place] working on step pattern/ length with several short seated rest breaks  required due to intensity of knee and ankle pain.   6" Alternating heel taps at stairs with light UE assist on handrails.  2x30 sec. with fatigue reported/ seated rest break.    Walking outside to pts. Car at side of clinic with use of SPC.  SBA/mod. I for safety and verbal cuing.  No LOB but moderate LE/generalized fatigue noted.    PATIENT EDUCATION:  Education details: Gait with RW.  Access Code: [email or web address] Person educated: Patient Education method: Explanation and Demonstration Education comprehension: verbalized understanding and returned demonstration  HOME EXERCISE PROGRAM: Access Code: [email or web address] URL: [email or web address] Date: 07/13/2022 Prepared by: Dorcas Carrow  Exercises - Seated Long Arc Quad  - 2 x daily - 7 x weekly - 2 sets - 10 reps - Seated Heel Toe Raises  - 2 x daily - 7 x weekly - 2 sets - 10 reps - Standing Hip Abduction with Counter Support  - 2 x daily - 7 x weekly - 2 sets - 10 reps - Standing Hip Extension with Counter Support  - 2 x daily - 7 x weekly - 2 sets - 10 reps - Standing March with Counter Support  - 2 x daily - 7 x weekly - 2 sets - 10 reps  ASSESSMENT:  CLINICAL IMPRESSION: Pt. Worked hard during therapy showing determination for improvement.  Pt. Has progressed to longer distance ambulation in clinic using a SPC and is able to walk short distances without the use of an assistive device. Moderate antalgic L LE gait is present when pt. Ambulates without an assistive device.  PT encouraged pt. To use SPC for longer distances and when ambulating throughout the community but approves gait without an assistive device for very short distances in home when pt.s Pain score is low. Pt. Completed all gait and there ex. Activities without any instances of LOB. O2 sats remained >95% t/o tx.  Session.  Pt. Will benefit from a consistent HEP to increase LE strength/ standing tolerance to improve functional mobility/ prepare for L TKA. Pt. Will continue to benefit from skilled PT services in order to increase endurance, decrease pain, and increase mobility in a safe/instructive environment.   OBJECTIVE IMPAIRMENTS: Abnormal gait, decreased activity tolerance, decreased balance, decreased endurance, decreased mobility, difficulty walking, decreased ROM, decreased strength, hypomobility, increased edema, impaired flexibility, improper body mechanics, postural dysfunction, obesity, and pain.   ACTIVITY LIMITATIONS: carrying, lifting, bending, standing, squatting, stairs, transfers, and locomotion level  PARTICIPATION LIMITATIONS: cleaning, driving, shopping, and community activity  PERSONAL FACTORS: Fitness and Past/current experiences are also affecting patient's functional outcome.   REHAB POTENTIAL: Fair obesity/ B ankle joint pain/ limitations.   CLINICAL DECISION MAKING: Evolving/moderate complexity  EVALUATION COMPLEXITY: Moderate   GOALS: Goals reviewed with patient? Yes  SHORT TERM GOALS: Target date: 08/08/22 Pt. Will be independent with HEP to increase B hip/LE muscle strength to 5/5 MMT to improve standing tolerance/ mobility.  Baseline:  See above Goal status: Partially met   LONG TERM GOALS: Target date: 10/03/22  Pt. Will increase FOTO to 49 to improve functional mobility.   Baseline: initial 35.  2/14: 41 Goal status: Partially met  2.  Pt. Able to complete 30 minutes of there.ex. with no increase c/o L medial knee pain.   Baseline:  pt. Currently not exercising/ limited mobility.   Goal status: Partially met  3.  Pt. Will ambulate 300 feet with least assistive device and no increase c/o L knee pain to improve pain-free mobility.   Baseline:  see above Goal status: Partially met (2/14).   PLAN:  PT FREQUENCY: 2x/week  PT DURATION: 12 weeks  PLANNED  INTERVENTIONS: Therapeutic exercises, Therapeutic activity, Neuromuscular re-education, Balance training, Gait training, Patient/Family education, Self Care, Joint mobilization, Stair training, DME instructions, Cryotherapy, Moist heat, Manual therapy, and Re-evaluation  PLAN FOR NEXT SESSION:  ISSUE new progressive HEP. Strength testing. Check schedule   Ashlyn B. Rogers Blocker, SPT Pura Spice, PT, DPT # [phone] 08/31/2022, 5:27 PM

## 2022-09-01 ENCOUNTER — Ambulatory Visit: Admit: 2022-09-01 | Discharge: 2022-09-01 | Payer: MEDICARE

## 2022-09-01 ENCOUNTER — Telehealth: Admit: 2022-09-01 | Discharge: 2022-09-01 | Payer: MEDICARE | Attending: Registered" | Primary: Registered"

## 2022-09-01 MED ORDER — PREDNISOLONE ACETATE 1 % EYE DROPS,SUSPENSION
OPHTHALMIC | 0 refills | 21 days | Status: CP
Start: 2022-09-01 — End: 2022-09-22

## 2022-09-01 MED ORDER — KETOROLAC 0.5 % EYE DROPS
Freq: Four times a day (QID) | OPHTHALMIC | 0 refills | 25 days | Status: CP
Start: 2022-09-01 — End: 2022-10-01

## 2022-09-02 ENCOUNTER — Other Ambulatory Visit: Payer: Medicare Other

## 2022-09-02 DIAGNOSIS — R972 Elevated prostate specific antigen [PSA]: Secondary | ICD-10-CM

## 2022-09-03 LAB — PSA: Prostate Specific Ag, Serum: 6.8 ng/mL — ABNORMAL HIGH (ref 0.0–4.0)

## 2022-09-06 ENCOUNTER — Encounter: Payer: Self-pay | Admitting: Urology

## 2022-09-06 ENCOUNTER — Ambulatory Visit: Payer: Medicare Other | Admitting: Urology

## 2022-09-06 VITALS — BP 124/78 | HR 92 | Ht 64.5 in | Wt 364.0 lb

## 2022-09-06 DIAGNOSIS — R972 Elevated prostate specific antigen [PSA]: Secondary | ICD-10-CM

## 2022-09-06 NOTE — Progress Notes (Signed)
Haze Rushing Plume,acting as a scribe for Hollice Espy, MD.,have documented all relevant documentation on the behalf of Hollice Espy, MD,as directed by  Hollice Espy, MD while in the presence of Hollice Espy, MD.   09/06/2022 2:05 PM   Wyatt Hernandez 20-Dec-1950 [id number]  Referring provider: Marguerita Merles, Saksham Akkerman [place],  [place] [address]  Chief Complaint  Patient presents with   Elevated PSA    HPI: 72 year-old male who returns today for a follow up prostate MRI. He was seen 6 months ago for a personal history of elevated PSA.  He was initially seen and evaluated for elevated PSA back in November 2022. At that time, his PSA was 5.9. We elected to repeat the PSA and seem to be trending back downwards to 5.3. Unfortunately however, this was back up to 6.3 as of 03/03/2022.   His most recent PSA on 09/02/2022 was 6.8. He does have multiple medical comorbidities. 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 His prostate volume was only 20.5 cc.   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), w CHF ([facility])    CHF (congestive heart failure) ([place])    Gout    Hypertension    Renal disorder    Sleep apnea    Vitamin D deficiency     Surgical History: Past Surgical History:  Procedure Laterality Date   COLONOSCOPY WITH PROPOFOL N/A 02/03/2017   Procedure: COLONOSCOPY WITH PROPOFOL;  Surgeon: Lollie Sails, MD;  Location: [facility] ENDOSCOPY;  Service: Endoscopy;  Laterality: N/A;   ESOPHAGOGASTRODUODENOSCOPY (EGD) WITH PROPOFOL N/A 02/03/2017   Procedure: ESOPHAGOGASTRODUODENOSCOPY (EGD) WITH  PROPOFOL;  Surgeon: Lollie Sails, MD;  Location: [facility] ENDOSCOPY;  Service: Endoscopy;  Laterality: N/A;   HERNIA REPAIR     TYMPANOPLASTY Left     Home Medications:  Allergies as of 09/06/2022       Reactions   Shellfish Allergy Shortness Of Breath, Nausea And Vomiting, Swelling        Medication List        Accurate as of September 06, 2022  2:05 PM. If you have any questions, ask your nurse or doctor.          STOP taking these medications    hydrochlorothiazide 12.5 MG capsule Commonly known as: MICROZIDE Stopped by: Hollice Espy, MD   lisinopril 40 MG tablet Commonly known as: ZESTRIL Stopped by: Hollice Espy, MD       TAKE these medications    albuterol 108 (90 Base) MCG/ACT inhaler Commonly known as: VENTOLIN HFA Inhale into the lungs.   allopurinol 300 MG tablet Commonly known as: ZYLOPRIM Take 300 mg by mouth daily.   amLODipine 10 MG tablet Commonly known as: NORVASC Take 10 mg by mouth daily.   Apixaban Starter Pack ('10mg'$  and '5mg'$ ) Commonly known as: ELIQUIS STARTER PACK Take as directed on package: start with two-'5mg'$  tablets twice daily for 7 days. On day 8, switch to one-'5mg'$  tablet twice daily.   bumetanide 1 MG tablet Commonly known  as: BUMEX Take 1 mg by mouth daily.   colchicine 0.6 MG tablet Take by mouth.   cyanocobalamin 50 MCG tablet Take 1 tablet by mouth daily.   diazepam 10 MG tablet Commonly known as: VALIUM PLEASE SEE ATTACHED FOR DETAILED DIRECTIONS   fluticasone 50 MCG/ACT nasal spray Commonly known as: FLONASE Place into both nostrils daily.   ketorolac 0.5 % ophthalmic solution Commonly known as: ACULAR Administer 1 drop to the right eye four (4) times a day.   latanoprost 0.005 % ophthalmic solution Commonly known as: XALATAN 1 drop at bedtime.   metoprolol tartrate 50 MG tablet Commonly known as: LOPRESSOR Take 1 tablet (50 mg total) by mouth 2 (two) times daily.   moxifloxacin 0.5 % ophthalmic  solution Commonly known as: VIGAMOX Place 1 drop into the right eye 4 (four) times daily.   oxyCODONE-acetaminophen 5-325 MG tablet Commonly known as: PERCOCET/ROXICET Take 1 tablet by mouth daily.   prednisoLONE acetate 1 % ophthalmic suspension Commonly known as: PRED FORTE Administer 1 drop to the right eye Three (3) times a day for 7 days, THEN 1 drop two (2) times a day for 7 days, THEN 1 drop daily for 7 days.   ranitidine 150 MG tablet Commonly known as: ZANTAC Take 150 mg by mouth 2 (two) times daily.   tadalafil 20 MG tablet Commonly known as: CIALIS Take 20 mg by mouth daily as needed for erectile dysfunction.   tamsulosin 0.4 MG Caps capsule Commonly known as: FLOMAX Take 0.4 mg by mouth daily.   Victoza 18 MG/3ML Sopn Generic drug: liraglutide Inject into the skin.   Vitamin D (Cholecalciferol) 10 MCG (400 UNIT) Tabs Take 1 tablet by mouth daily.        Allergies:  Allergies  Allergen Reactions   Shellfish Allergy Shortness Of Breath, Nausea And Vomiting and Swelling    Family History: Family History  Problem Relation Age of Onset   Heart failure Father    Heart attack Father 62   Hyperlipidemia Mother    Hypertension Mother    Heart failure Sister    Heart attack Sister    Heart attack Brother 75   Heart attack Brother 7    Social History:  reports that he has never smoked. He has never used smokeless tobacco. He reports current alcohol use. He reports current drug use. Drug: Oxycodone.   Physical Exam: BP 124/78   Pulse 92   Ht 5' 4.5" (1.638 m)   Wt (!) 364 lb (165.1 kg)   BMI 61.52 kg/m   Constitutional:  Alert and oriented, No acute distress. HEENT: [place] AT, moist mucus membranes.  Trachea midline, no masses. Neurologic: Grossly intact, no focal deficits, moving all 4 extremities. Psychiatric: Normal mood and affect.  Assessment & Plan:    1. Elevated/ rising PSA - PSA is upwards trending although his MRI is fairly equivocal.  -  We will wait one more 6 month interval and if his PSA continues to rise, we will go ahead and plan to schedule a prostate fusion biopsy. In that case, he will need to hold his Eliquis and we would need clearance for that in the setting of a personal history of DVT's.  - We would also have to carefully consider his medical comorbidities and overall life expectancy when considering how aggressive to be with intervention.  He understands all of this and agrees with a more conservative approach for the time being. -  We reviewed the implications of an  elevated PSA and the uncertainty surrounding it. In general, a man's PSA increases with age and is produced by both normal and cancerous prostate tissue. Differential for elevated PSA is BPH, prostate cancer, infection, recent intercourse/ejaculation, prostate infarction, recent urethroscopic manipulation (foley placement/cystoscopy) and prostatitis. Management of an elevated PSA can include observation or prostate biopsy and wediscussed this in detail. We discussed that indications for prostate biopsy are defined by age and race specific PSA cutoffs as well as a PSA velocity of 0.75/year.   Return in about 6 months (around 03/09/2023) for repeat PSA and virtual visit.  I have reviewed the above documentation for accuracy and completeness, and I agree with the above.   Hollice Espy, MD    [facility] [address] [place], [place] [address] [phone]

## 2022-09-07 ENCOUNTER — Ambulatory Visit: Payer: Medicare Other | Attending: Orthopedic Surgery | Admitting: Physical Therapy

## 2022-09-19 ENCOUNTER — Encounter: Admit: 2022-09-19 | Discharge: 2022-09-19 | Payer: MEDICARE | Attending: Anesthesiology | Primary: Anesthesiology

## 2022-09-19 ENCOUNTER — Ambulatory Visit: Admit: 2022-09-19 | Discharge: 2022-09-19 | Payer: MEDICARE

## 2022-09-19 MED ORDER — PREDNISOLONE ACETATE 1 % EYE DROPS,SUSPENSION
Freq: Four times a day (QID) | OPHTHALMIC | 0 refills | 25 days | Status: CP
Start: 2022-09-19 — End: ?
  Filled 2022-09-19: qty 5, 25d supply, fill #0

## 2022-09-19 MED ORDER — MOXIFLOXACIN 0.5 % EYE DROPS
Freq: Four times a day (QID) | OPHTHALMIC | 0 refills | 15 days | Status: CP
Start: 2022-09-19 — End: ?
  Filled 2022-09-19: qty 3, 15d supply, fill #0

## 2022-09-20 ENCOUNTER — Ambulatory Visit: Admit: 2022-09-20 | Discharge: 2022-09-21 | Payer: MEDICARE

## 2022-09-20 DIAGNOSIS — Z9889 Other specified postprocedural states: Principal | ICD-10-CM

## 2022-09-26 DIAGNOSIS — M1990 Unspecified osteoarthritis, unspecified site: Principal | ICD-10-CM

## 2022-09-26 MED ORDER — DICLOFENAC 1 % TOPICAL GEL
Freq: Four times a day (QID) | TOPICAL | 0 refills | 13 days | Status: CP
Start: 2022-09-26 — End: 2023-09-26

## 2022-10-05 ENCOUNTER — Ambulatory Visit: Admit: 2022-10-05 | Discharge: 2022-10-06 | Payer: MEDICARE

## 2022-10-05 DIAGNOSIS — Z9889 Other specified postprocedural states: Principal | ICD-10-CM

## 2022-10-05 DIAGNOSIS — H35352 Cystoid macular degeneration, left eye: Principal | ICD-10-CM

## 2022-10-19 ENCOUNTER — Ambulatory Visit: Admit: 2022-10-19 | Discharge: 2022-10-20 | Payer: MEDICARE | Attending: Ambulatory Care | Primary: Ambulatory Care

## 2022-10-19 MED ORDER — OZEMPIC 0.25 MG OR 0.5 MG (2 MG/3 ML) SUBCUTANEOUS PEN INJECTOR
0 refills | 0 days | Status: CP
Start: 2022-10-19 — End: ?

## 2022-10-20 MED ORDER — OZEMPIC 0.25 MG OR 0.5 MG (2 MG/3 ML) SUBCUTANEOUS PEN INJECTOR
3 refills | 0 days | Status: CP
Start: 2022-10-20 — End: ?
  Filled 2022-10-19: qty 3, 42d supply, fill #0
  Filled 2022-12-15: qty 3, 28d supply, fill #0

## 2022-11-08 DIAGNOSIS — I10 Essential (primary) hypertension: Principal | ICD-10-CM

## 2022-11-08 DIAGNOSIS — R3129 Other microscopic hematuria: Principal | ICD-10-CM

## 2022-11-08 DIAGNOSIS — N1832 Stage 3b chronic kidney disease (CMS-HCC): Principal | ICD-10-CM

## 2022-11-09 ENCOUNTER — Ambulatory Visit: Admit: 2022-11-09 | Discharge: 2022-11-10 | Payer: MEDICARE

## 2022-11-09 DIAGNOSIS — H35352 Cystoid macular degeneration, left eye: Principal | ICD-10-CM

## 2022-11-10 ENCOUNTER — Ambulatory Visit: Admit: 2022-11-10 | Discharge: 2022-11-11 | Payer: MEDICARE

## 2022-11-15 MED ORDER — PREDNISOLONE ACETATE 1 % EYE DROPS,SUSPENSION
Freq: Four times a day (QID) | OPHTHALMIC | 1 refills | 50 days | Status: CP
Start: 2022-11-15 — End: ?

## 2022-11-21 ENCOUNTER — Ambulatory Visit: Admit: 2022-11-21 | Discharge: 2022-11-22 | Payer: MEDICARE

## 2022-11-21 DIAGNOSIS — G4733 Obstructive sleep apnea (adult) (pediatric): Principal | ICD-10-CM

## 2022-11-21 DIAGNOSIS — N1832 Stage 3b chronic kidney disease (CMS-HCC): Principal | ICD-10-CM

## 2022-11-21 DIAGNOSIS — I10 Essential (primary) hypertension: Principal | ICD-10-CM

## 2022-11-29 ENCOUNTER — Ambulatory Visit
Admit: 2022-11-29 | Payer: MEDICARE | Attending: Student in an Organized Health Care Education/Training Program | Primary: Student in an Organized Health Care Education/Training Program

## 2022-12-13 ENCOUNTER — Ambulatory Visit: Admit: 2022-12-13 | Discharge: 2022-12-14 | Payer: MEDICARE

## 2023-01-10 MED ORDER — PREDNISOLONE ACETATE 1 % EYE DROPS,SUSPENSION
Freq: Four times a day (QID) | OPHTHALMIC | 1 refills | 50 days
Start: 2023-01-10 — End: ?

## 2023-01-10 MED FILL — OZEMPIC 0.25 MG OR 0.5 MG (2 MG/3 ML) SUBCUTANEOUS PEN INJECTOR: 28 days supply | Qty: 3 | Fill #1

## 2023-01-10 NOTE — Unmapped (Signed)
[facility] has reached out to this patient via MyChart to onboard them to our Specialty Lite services for their Ozempic. Their medication is scheduled to be delivered on 01/11/23.They will now receive proactive outreach from the pharmacy team for refills.    Cyndee Giammarco M Anavictoria Wilk, PharmD  [facility]

## 2023-01-10 NOTE — Unmapped (Signed)
[facility] Specialty Medication Onboarding    Specialty Medication: OZEMPIC 0.25 mg or 0.5 mg (2 mg/3 mL) Pnij (semaglutide)  Prior Authorization: Not Required   Financial Assistance: No - copay  <$25  Final Copay/Day Supply: $0 / 28    Insurance Restrictions: None     Notes to Pharmacist: refill  Credit Card on File: yes    The triage team has completed the benefits investigation and has determined that the patient is able to fill this medication at [facility]. Please contact the patient to complete the onboarding or follow up with the prescribing physician as needed.

## 2023-01-12 ENCOUNTER — Ambulatory Visit: Admit: 2023-01-12 | Discharge: 2023-01-13 | Payer: MEDICARE

## 2023-01-12 DIAGNOSIS — Z9889 Other specified postprocedural states: Principal | ICD-10-CM

## 2023-01-12 NOTE — Unmapped (Signed)
ASSESSMENT:      4 week s/p  phaco and PC IOL left eye Doing well with a normal postoperative appearance.  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    Stay without glaucoma drops and F/U in 4 months with V/T/HVF/OCT

## 2023-01-19 NOTE — Unmapped (Signed)
Pt called requesting an order for diabetic shoes.  Request forwarded to Dr Hale Bogus.

## 2023-02-06 NOTE — Unmapped (Signed)
[facility] Pharmacy Refill Coordination Note    Specialty Lite Medication(s) to be Shipped:   Ozempic    Other medication(s) to be shipped: No additional medications requested for fill at this time     Charles Boyle, DOB: [birth year]  Phone: [phone] (home)       All above HIPAA information was verified with patient.     Was a Nurse, learning disability used for this call? No    Changes to medications: [name] reports no changes at this time.  Changes to insurance: No      REFERRAL TO PHARMACIST     Referral to the pharmacist: Not needed      [facility]     Shipping address confirmed in Epic.     Delivery Scheduled: Yes, Expected medication delivery date: 02/07/23.     Medication will be delivered via Same Day Courier to the prescription address in Epic WAM.    [name]   [facility] Specialty Technician

## 2023-02-07 NOTE — Unmapped (Signed)
Signed PAP order faxed to [facility] on 01/27/23 at 16:07. Fax confirmation received.

## 2023-02-14 MED FILL — OZEMPIC 0.25 MG OR 0.5 MG (2 MG/3 ML) SUBCUTANEOUS PEN INJECTOR: 140 days supply | Qty: 15 | Fill #2

## 2023-02-27 NOTE — Unmapped (Signed)
Specialty Medication(s): Ozempic    Mr.Eager has been dis-enrolled from the [facility] specialty pharmacy services due to  patient receiving medication via manufacturer assistance. .    Additional information provided to the patient: n/a    Arnold Long, PharmD  [facility] Specialty Pharmacist

## 2023-03-09 ENCOUNTER — Other Ambulatory Visit: Payer: Medicare Other

## 2023-03-09 DIAGNOSIS — R972 Elevated prostate specific antigen [PSA]: Secondary | ICD-10-CM

## 2023-03-10 LAB — PSA: Prostate Specific Ag, Serum: 6.3 ng/mL — ABNORMAL HIGH (ref 0.0–4.0)

## 2023-03-15 ENCOUNTER — Telehealth: Payer: Medicare Other | Admitting: Urology

## 2023-03-15 DIAGNOSIS — R972 Elevated prostate specific antigen [PSA]: Secondary | ICD-10-CM | POA: Diagnosis not present

## 2023-03-15 NOTE — Progress Notes (Signed)
Virtual Visit via Video Note  I connected with Wyatt Hernandez on 03/15/2023 at  4:00 PM EDT by a video enabled telemedicine application and verified that I am speaking with the correct person using two identifiers.  Location: Patient: at home Provider: in office   I discussed the limitations of evaluation and management by telemedicine and the availability of in person appointments. The patient expressed understanding and agreed to proceed.  History of Present Illness:  72 year-old male who presents virtually today with a personal history of elevated PSA. He was initially referred back in 05/2021 at which time his PSA was 5.8. Please see PSA trend below. He was last seen in person in March.   He had a prostate MRI that showed a PI-RADS 3 lesion and prostate volume of only 20.5 cc's.    Prostate Specific Ag, Serum  Latest Ref Rng 0.0 - 4.0 ng/mL  05/25/2021 5.9 (H)   09/02/2021 5.3 (H)   03/03/2022 6.3 (H)   09/02/2022 6.8 (H)   03/09/2023 6.3 (H)       Observations/Objective:  Patient appears well  Assessment and Plan:  1. Elevated/Rising PSA - His MRI is equivocal - His PSA is trending back down to 6.3 most recently - Given his medical comorbidities, we will plan for a more conservative approach - Continue to trend his PSA - Hold off on fusion biopsy at this time.  Follow Up Instructions:  Recheck PSA in 6 months. Office visit with PSA and DRE in 1 year.     I discussed the assessment and treatment plan with the patient. The patient was provided an opportunity to ask questions and all were answered. The patient agreed with the plan and demonstrated an understanding of the instructions.   The patient was advised to call back or seek an in-person evaluation if the symptoms worsen or if the condition fails to improve as anticipated.  I provided 5 minutes of non-face-to-face time during this encounter.   I have reviewed the above documentation for accuracy and completeness,  and I agree with the above.   [name], MD

## 2023-03-22 ENCOUNTER — Ambulatory Visit: Admit: 2023-03-22 | Discharge: 2023-03-23 | Payer: MEDICARE

## 2023-03-22 DIAGNOSIS — I82411 Acute embolism and thrombosis of right femoral vein: Principal | ICD-10-CM

## 2023-03-22 DIAGNOSIS — Z7901 Long term (current) use of anticoagulants: Principal | ICD-10-CM

## 2023-03-22 DIAGNOSIS — N1832 Stage 3b chronic kidney disease (CMS-HCC): Principal | ICD-10-CM

## 2023-03-22 DIAGNOSIS — D649 Anemia, unspecified: Principal | ICD-10-CM

## 2023-03-22 MED ORDER — APIXABAN 5 MG TABLET
ORAL_TABLET | Freq: Two times a day (BID) | ORAL | 3 refills | 90 days | Status: CP
Start: 2023-03-22 — End: 2024-03-21

## 2023-03-24 ENCOUNTER — Ambulatory Visit: Admit: 2023-03-24 | Discharge: 2023-03-25 | Payer: MEDICARE

## 2023-04-21 MED FILL — OZEMPIC 0.25 MG OR 0.5 MG (2 MG/3 ML) SUBCUTANEOUS PEN INJECTOR: 112 days supply | Qty: 12 | Fill #3

## 2023-07-10 DIAGNOSIS — I10 Essential (primary) hypertension: Principal | ICD-10-CM

## 2023-07-10 DIAGNOSIS — N1832 Stage 3b chronic kidney disease (CMS-HCC): Principal | ICD-10-CM

## 2023-07-10 DIAGNOSIS — R3129 Other microscopic hematuria: Principal | ICD-10-CM

## 2023-07-14 ENCOUNTER — Ambulatory Visit: Admit: 2023-07-14 | Discharge: 2023-07-15 | Payer: MEDICARE

## 2023-07-31 ENCOUNTER — Ambulatory Visit: Admit: 2023-07-31 | Discharge: 2023-08-01 | Payer: MEDICARE

## 2023-07-31 DIAGNOSIS — G4733 Obstructive sleep apnea (adult) (pediatric): Principal | ICD-10-CM

## 2023-07-31 DIAGNOSIS — R600 Localized edema: Principal | ICD-10-CM

## 2023-07-31 DIAGNOSIS — N1832 Stage 3b chronic kidney disease (CMS-HCC): Principal | ICD-10-CM

## 2023-07-31 DIAGNOSIS — I10 Essential (primary) hypertension: Principal | ICD-10-CM

## 2023-08-01 ENCOUNTER — Encounter: Payer: Medicare Other | Attending: Physician Assistant | Admitting: Physician Assistant

## 2023-08-01 DIAGNOSIS — L97812 Non-pressure chronic ulcer of other part of right lower leg with fat layer exposed: Secondary | ICD-10-CM | POA: Insufficient documentation

## 2023-08-01 DIAGNOSIS — I130 Hypertensive heart and chronic kidney disease with heart failure and stage 1 through stage 4 chronic kidney disease, or unspecified chronic kidney disease: Secondary | ICD-10-CM | POA: Insufficient documentation

## 2023-08-01 DIAGNOSIS — N183 Chronic kidney disease, stage 3 unspecified: Secondary | ICD-10-CM | POA: Diagnosis not present

## 2023-08-01 DIAGNOSIS — I890 Lymphedema, not elsewhere classified: Secondary | ICD-10-CM | POA: Insufficient documentation

## 2023-08-01 DIAGNOSIS — J4489 Other specified chronic obstructive pulmonary disease: Secondary | ICD-10-CM | POA: Diagnosis not present

## 2023-08-01 DIAGNOSIS — I5042 Chronic combined systolic (congestive) and diastolic (congestive) heart failure: Secondary | ICD-10-CM | POA: Insufficient documentation

## 2023-08-03 DIAGNOSIS — I890 Lymphedema, not elsewhere classified: Secondary | ICD-10-CM | POA: Diagnosis not present

## 2023-08-08 ENCOUNTER — Ambulatory Visit: Payer: Medicare Other | Admitting: Physician Assistant

## 2023-08-09 ENCOUNTER — Inpatient Hospital Stay: Admit: 2023-08-09 | Discharge: 2023-08-10 | Payer: MEDICARE

## 2023-08-10 ENCOUNTER — Encounter: Payer: Medicare Other | Attending: Physician Assistant | Admitting: Physician Assistant

## 2023-08-10 DIAGNOSIS — L97812 Non-pressure chronic ulcer of other part of right lower leg with fat layer exposed: Secondary | ICD-10-CM | POA: Insufficient documentation

## 2023-08-10 DIAGNOSIS — I10 Essential (primary) hypertension: Secondary | ICD-10-CM | POA: Insufficient documentation

## 2023-08-10 DIAGNOSIS — I890 Lymphedema, not elsewhere classified: Secondary | ICD-10-CM | POA: Diagnosis not present

## 2023-08-18 ENCOUNTER — Encounter: Payer: Medicare Other | Admitting: Physician Assistant

## 2023-08-18 DIAGNOSIS — L97812 Non-pressure chronic ulcer of other part of right lower leg with fat layer exposed: Secondary | ICD-10-CM | POA: Diagnosis not present

## 2023-08-24 DIAGNOSIS — L97812 Non-pressure chronic ulcer of other part of right lower leg with fat layer exposed: Secondary | ICD-10-CM | POA: Diagnosis not present

## 2023-08-31 ENCOUNTER — Encounter: Payer: Medicare Other | Admitting: Physician Assistant

## 2023-08-31 DIAGNOSIS — L97812 Non-pressure chronic ulcer of other part of right lower leg with fat layer exposed: Secondary | ICD-10-CM | POA: Diagnosis not present

## 2023-09-07 ENCOUNTER — Encounter: Payer: Medicare Other | Attending: Physician Assistant

## 2023-09-07 DIAGNOSIS — I890 Lymphedema, not elsewhere classified: Secondary | ICD-10-CM | POA: Insufficient documentation

## 2023-09-07 DIAGNOSIS — I5042 Chronic combined systolic (congestive) and diastolic (congestive) heart failure: Secondary | ICD-10-CM | POA: Insufficient documentation

## 2023-09-07 DIAGNOSIS — J4489 Other specified chronic obstructive pulmonary disease: Secondary | ICD-10-CM | POA: Diagnosis not present

## 2023-09-07 DIAGNOSIS — L97812 Non-pressure chronic ulcer of other part of right lower leg with fat layer exposed: Secondary | ICD-10-CM | POA: Insufficient documentation

## 2023-09-07 DIAGNOSIS — Z09 Encounter for follow-up examination after completed treatment for conditions other than malignant neoplasm: Secondary | ICD-10-CM | POA: Diagnosis not present

## 2023-09-07 DIAGNOSIS — I130 Hypertensive heart and chronic kidney disease with heart failure and stage 1 through stage 4 chronic kidney disease, or unspecified chronic kidney disease: Secondary | ICD-10-CM | POA: Insufficient documentation

## 2023-09-07 DIAGNOSIS — N183 Chronic kidney disease, stage 3 unspecified: Secondary | ICD-10-CM | POA: Diagnosis not present

## 2023-09-12 ENCOUNTER — Other Ambulatory Visit: Payer: Medicare Other

## 2023-09-12 DIAGNOSIS — R972 Elevated prostate specific antigen [PSA]: Secondary | ICD-10-CM

## 2023-09-13 ENCOUNTER — Encounter: Admitting: Physician Assistant

## 2023-09-13 DIAGNOSIS — Z09 Encounter for follow-up examination after completed treatment for conditions other than malignant neoplasm: Secondary | ICD-10-CM | POA: Diagnosis not present

## 2023-09-13 LAB — PSA: Prostate Specific Ag, Serum: 7.2 ng/mL — ABNORMAL HIGH (ref 0.0–4.0)

## 2023-09-14 ENCOUNTER — Encounter: Payer: Self-pay | Admitting: Urology

## 2023-09-14 ENCOUNTER — Ambulatory Visit: Payer: Medicare Other | Admitting: Physician Assistant

## 2023-10-02 ENCOUNTER — Ambulatory Visit
Admit: 2023-10-02 | Discharge: 2023-10-03 | Attending: Student in an Organized Health Care Education/Training Program | Primary: Student in an Organized Health Care Education/Training Program

## 2023-10-02 DIAGNOSIS — M1A39X0 Chronic gout due to renal impairment, multiple sites, without tophus (tophi): Principal | ICD-10-CM

## 2023-10-02 DIAGNOSIS — M7041 Prepatellar bursitis, right knee: Principal | ICD-10-CM

## 2023-10-02 DIAGNOSIS — N1832 Stage 3b chronic kidney disease (CMS-HCC): Principal | ICD-10-CM

## 2023-10-02 DIAGNOSIS — M1180 Other specified crystal arthropathies, unspecified site: Principal | ICD-10-CM

## 2023-10-03 ENCOUNTER — Ambulatory Visit: Attending: Vascular Surgery | Admitting: Occupational Therapy

## 2023-10-03 DIAGNOSIS — I890 Lymphedema, not elsewhere classified: Secondary | ICD-10-CM | POA: Diagnosis present

## 2023-10-03 NOTE — Therapy (Unsigned)
 OUTPATIENT OCCUPATIONAL THERAPY EVALUATION   LOWER EXTREMITY LYMPHEDEMA  Patient Name: Wyatt Hernandez MRN: [id number] DOB:[birth year], 73 y.o., male Today's Date: 10/03/2023  END OF SESSION:   OT End of Session - 10/03/23 1102     Visit Number 1    Number of Visits 1   Date for OT Re-Evaluation 10/03/23   OT Start Time 1100    OT Stop Time 1215    OT Time Calculation (min) 75 min    Activity Tolerance Patient tolerated treatment well;No increased pain    Behavior During Therapy WFL for tasks assessed/performed               Past Medical History:  Diagnosis Date   Benign hypertensive heart and CKD, 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), w CHF ([facility])    CHF (congestive heart failure) ([facility])    Gout    Hypertension    Renal disorder    Sleep apnea    Vitamin D deficiency    Past Surgical History:  Procedure Laterality Date   COLONOSCOPY WITH PROPOFOL N/A 02/03/2017   Procedure: COLONOSCOPY WITH PROPOFOL;  Surgeon: Christena Deem, MD;  Location: [facility] ENDOSCOPY;  Service: Endoscopy;  Laterality: N/A;   ESOPHAGOGASTRODUODENOSCOPY (EGD) WITH PROPOFOL N/A 02/03/2017   Procedure: ESOPHAGOGASTRODUODENOSCOPY (EGD) WITH PROPOFOL;  Surgeon: Christena Deem, MD;  Location: [facility] ENDOSCOPY;  Service: Endoscopy;  Laterality: N/A;   HERNIA REPAIR     TYMPANOPLASTY Left    Patient Active Problem List   Diagnosis Date Noted   Opiate overdose (HCC)    Narcotic overdose ([facility]CC) 03/29/2017   Coronary artery disease involving native coronary artery of native heart without angina pectoris 02/01/2017   Shortness of breath 09/01/2016   (HFpEF) heart failure with preserved ejection fraction (HCC) 09/01/2016   Hypertension 09/01/2016   Mixed hyperlipidemia 09/01/2016   Chest pain 08/15/2016    PCP: Darreld Mclean, MD  REFERRING PROVIDER: Adonis Brook, PA-C  REFERRING DIAG: I89.0  THERAPY DIAG:  Lymphedema, not elsewhere classified  Rationale for Evaluation and Treatment: Rehabilitation  ONSET  DATE: "5-6 years ago"  SUBJECTIVE:                                                                                                                                                                                           SUBJECTIVE STATEMENT: Wyatt Hernandez is referred to Occupational Therapy by Adonis Brook, PA-C, for evaluation and treatment of lymphedema. Pt arrives at clinic in a transport wheelchair. He uses his walker to transfer from the wc to a chair with armrests in the Rx room. Pt tells me he does not know why he is  here at this appontment or who sent him. OT explained referral source and provided overview of lymphedema care.   Pt reports the swelling in his legs started about 5 years ago without known precipitating event. Pt states it has worsened over time.  Wyatt. Sheeley states that his mother had chronicly swollen, painful legs. Pt is wearing a Circaid Juxtafit, or Juxta Lite, adjustable, Velcro wrap-style, compression legging on the R leg only. The legging ids too short and only covers about 2/3 of the R leg. Pt reports most of the time the swelling in his L leg is minor.  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 Yes: NPRS scale: 8/10; R foot 9.5/10 Pain location: legs and feet Pain description: stabbing pain Aggravating factors: sitting up too long, standing, walking Relieving factors: elevation, laying down  PRECAUTIONS: Other: lymphedema precautions; hx of non-healing leg wound  RED FLAGS: Bowel or bladder incontinence: No   WEIGHT BEARING RESTRICTIONS: No  FALLS:  Has patient fallen in last 6 months?  No  LIVING ENVIRONMENT: Lives with: lives with their spouse and lives with their son Lives in: House/apartment Stairs: Yes; External: 1 steps; can reach both Has following equipment at home: Single point cane, Walker - 2 wheeled, and shower chair  OCCUPATION: retired  LEISURE: golf, but can't play; play my acoustic guitar  HAND DOMINANCE: right   PRIOR LEVEL OF FUNCTION: Independent  PATIENT GOALS: "Be able to play 9 holes of golf; lose 50 lbs. Live until I'm 86."   OBJECTIVE: Note: Objective measures were completed at Evaluation unless otherwise noted.  COGNITION:  Overall cognitive status: {cognition:24006}   PALPATION: ***  OBSERVATIONS / OTHER ASSESSMENTS: ***  SENSATION: {sensation:27233}  POSTURE: ***  LE ROM:   {AROM/PROM:27142}  Right 10/03/2023 LEFT 10/03/2023  Hip flexion    Hip extension    Hip abduction    Hip adduction    Hip internal rotation    Hip external rotation    Knee flexion    Knee extension    Ankle dorsiflexion    Ankle plantarflexion    Ankle inversion    Ankle eversion     (Blank rows = not tested)  LE MMT:   MMT Right 10/03/2023 Left 10/03/2023  Hip flexion    Hip extension    Hip abduction    Hip adduction    Hip internal rotation    Hip external rotation    Knee flexion    Knee extension    Ankle dorsiflexion    Ankle plantarflexion    Ankle inversion    Ankle eversion     (Blank rows = not tested)  LYMPHEDEMA ASSESSMENTS:   SURGERY TYPE/DATE: ***  NUMBER OF LYMPH NODES REMOVED: ***  CHEMOTHERAPY: ***  RADIATION:***  HORMONE TREATMENT: ***  INFECTIONS: ***   LYMPHEDEMA ASSESSMENTS:   LANDMARK RIGHT  10/03/2023  10 cm proximal to olecranon process   Olecranon process   10 cm proximal to ulnar styloid process   Just proximal to ulnar styloid process   Across hand at thumb web space   At base of 2nd  digit   (Blank rows = not tested)  LANDMARK LEFT  10/03/2023  10 cm proximal to olecranon process    Olecranon process   10 cm proximal to ulnar styloid process   Just proximal to ulnar styloid process   Across hand at thumb web space   At base of 2nd digit   (Blank rows = not tested)   LE LANDMARK RIGHT 10/03/2023  At groin   30 cm proximal to suprapatella   20 cm proximal to suprapatella   10 cm proximal to suprapatella   At midpatella / popliteal crease   30 cm proximal to floor at lateral plantar foot   20 cm proximal to floor at lateral plantar foot   10 cm proximal to floor at lateral plantar foot   Circumference of ankle/heel   5 cm proximal to 1st MTP joint   Across MTP joint   Around proximal great toe   (Blank rows = not tested)  LE LANDMARK LEFT 10/03/2023  At groin   30 cm proximal to suprapatella   20 cm proximal to suprapatella   10 cm proximal to suprapatella   At midpatella / popliteal crease   30 cm proximal to floor at lateral plantar foot   20 cm proximal to floor at lateral plantar foot   10 cm proximal to floor at lateral plantar foot   Circumference of ankle/heel   5 cm proximal to 1st MTP joint   Across MTP joint   Around proximal great toe   (Blank rows = not tested)  FUNCTIONAL TESTS:  {Functional tests:24029}  GAIT: Distance walked: *** Assistive device utilized: {Assistive devices:23999} Level of assistance: {Levels of assistance:24026} Comments: ***  LYMPHEDEMA LIFE IMPACT SCALE (LLIS):  I. Physical Concerns The amount of pain associated with my lymphedema is: {LLISSELECTION:27229} The amount of limb heaviness associated with my lymphedema is: {LLISSELECTION:27229} The amount of skin tightness associated with my lymphedema is: {LLISSELECTION:27229} In comparison to my unaffected limb, the size of my swollen limb seems: {LLISSELECTION:27229} In comparison to my unaffected limb, the texture of my swollen limb feels: {LLISSELECTION:27229} Lymphedema affects movement of my swollen limb: {LLISSELECTION:27229} The strength in my swollen  limb compared with the unaffected limb is: {LLISSELECTION:27229} How often have you become ill wit an infection in your swollen limb requiring oral antibiotics or hospitalization in the past 2 YEARS? {LLISSELECTION:27229}  II. Psychosocial Concerns 9.   Lymphedema affects my body image (ie. "How I think I look"): {LLISSELECTION:27229} 10. Lymphedema affects my socializing with others: {LLISSELECTION:27229} 11. Lymphedema affects my intimate relations: {LLISSELECTION:27229} 12. Lymphedema "gets me down" (ie. I have feelings of depression, frustration, or anger due to the lymphedema): {LLISSELECTION:27229}  III. Functional Concerns 13. Lymphedema affects my ability to perform duties at home: {LLISSELECTION:27229} 14. Lymphedema affects my ability to perform dueites at work (if applicable): {LLISSELECTION:27229} 15. Lymphedema affects my performance of preferred recreational activities: {LLISSELECTION:27229} 16. Lymphedema affects the proper fit of clothing/shoes: {LLISSELECTION:27229} 17. Lymphedema affects my sleep: {LLISSELECTION:27229} 18. I must rely on others for help due to my lymphedema: {LLISSELECTION:27229}  TREATMENT DATE: ***   PATIENT EDUCATION:  Education details: *** Person educated: {Person educated:25204} Education method: {Education Method:25205} Education comprehension: {Education Comprehension:25206}  HOME EXERCISE PROGRAM: ***  ASSESSMENT:  CLINICAL IMPRESSION: Patient is a *** y.o. *** who was seen today for physical therapy evaluation and treatment for ***.   OBJECTIVE IMPAIRMENTS: {opptimpairments:25111}.   ACTIVITY LIMITATIONS: {activitylimitations:27494}  PARTICIPATION LIMITATIONS: {participationrestrictions:25113}  PERSONAL FACTORS: {Personal factors:25162} are also affecting patient's functional outcome.   REHAB POTENTIAL:  {rehabpotential:25112}  CLINICAL DECISION MAKING: {clinical decision making:25114}  EVALUATION COMPLEXITY: {Evaluation complexity:25115}   GOALS: Goals reviewed with patient? {yes/no:20286}  SHORT TERM GOALS: Target date: ***  *** Baseline: Goal status: INITIAL  2.  *** Baseline:  Goal status: INITIAL  3.  *** Baseline:  Goal status: INITIAL  4.  *** Baseline:  Goal status: INITIAL  5.  *** Baseline:  Goal status: INITIAL  6.  *** Baseline:  Goal status: INITIAL  LONG TERM GOALS: Target date: ***  *** Baseline:  Goal status: INITIAL  2.  *** Baseline:  Goal status: INITIAL  3.  *** Baseline:  Goal status: INITIAL  4.  *** Baseline:  Goal status: INITIAL  5.  *** Baseline:  Goal status: INITIAL  6.  *** Baseline:  Goal status: INITIAL   PLAN:  PT FREQUENCY: {rehab frequency:25116}  PT DURATION: {rehab duration:25117}  PLANNED INTERVENTIONS: {rehab planned interventions:25118::"97110-Therapeutic exercises","97530- Therapeutic [phone]- Neuromuscular re-education","97535- Self QIHK","74259- Manual therapy"}  PLAN FOR NEXT SESSION: ***  Loel Dubonnet, MS, OTR/L, CLT-LANA 10/03/23 12:44 PM

## 2023-10-04 NOTE — Addendum Note (Signed)
 Addended by: Judithann Sauger on: 10/04/2023 01:31 PM   Modules accepted: Orders

## 2023-10-16 ENCOUNTER — Encounter: Attending: Physician Assistant | Admitting: Physician Assistant

## 2023-10-16 DIAGNOSIS — I5042 Chronic combined systolic (congestive) and diastolic (congestive) heart failure: Secondary | ICD-10-CM | POA: Diagnosis not present

## 2023-10-16 DIAGNOSIS — I890 Lymphedema, not elsewhere classified: Secondary | ICD-10-CM | POA: Diagnosis not present

## 2023-10-16 DIAGNOSIS — I130 Hypertensive heart and chronic kidney disease with heart failure and stage 1 through stage 4 chronic kidney disease, or unspecified chronic kidney disease: Secondary | ICD-10-CM | POA: Insufficient documentation

## 2023-10-16 DIAGNOSIS — N183 Chronic kidney disease, stage 3 unspecified: Secondary | ICD-10-CM | POA: Diagnosis not present

## 2023-10-16 DIAGNOSIS — J4489 Other specified chronic obstructive pulmonary disease: Secondary | ICD-10-CM | POA: Diagnosis not present

## 2023-10-16 DIAGNOSIS — Z6841 Body Mass Index (BMI) 40.0 and over, adult: Secondary | ICD-10-CM | POA: Diagnosis not present

## 2023-10-16 DIAGNOSIS — L97812 Non-pressure chronic ulcer of other part of right lower leg with fat layer exposed: Secondary | ICD-10-CM | POA: Insufficient documentation

## 2023-10-23 ENCOUNTER — Encounter

## 2023-10-23 DIAGNOSIS — L97812 Non-pressure chronic ulcer of other part of right lower leg with fat layer exposed: Secondary | ICD-10-CM | POA: Diagnosis not present

## 2023-10-30 ENCOUNTER — Encounter: Admitting: Physician Assistant

## 2023-10-30 DIAGNOSIS — L97812 Non-pressure chronic ulcer of other part of right lower leg with fat layer exposed: Secondary | ICD-10-CM | POA: Diagnosis not present

## 2023-11-01 ENCOUNTER — Ambulatory Visit: Admit: 2023-11-01 | Discharge: 2023-11-02 | Payer: Medicare (Managed Care)

## 2023-11-06 ENCOUNTER — Encounter: Attending: Physician Assistant | Admitting: Physician Assistant

## 2023-11-06 DIAGNOSIS — I890 Lymphedema, not elsewhere classified: Secondary | ICD-10-CM | POA: Insufficient documentation

## 2023-11-06 DIAGNOSIS — I110 Hypertensive heart disease with heart failure: Secondary | ICD-10-CM | POA: Insufficient documentation

## 2023-11-06 DIAGNOSIS — L97812 Non-pressure chronic ulcer of other part of right lower leg with fat layer exposed: Secondary | ICD-10-CM | POA: Insufficient documentation

## 2023-11-06 DIAGNOSIS — J4489 Other specified chronic obstructive pulmonary disease: Secondary | ICD-10-CM | POA: Insufficient documentation

## 2023-11-06 DIAGNOSIS — I5042 Chronic combined systolic (congestive) and diastolic (congestive) heart failure: Secondary | ICD-10-CM | POA: Insufficient documentation

## 2023-11-13 ENCOUNTER — Encounter: Admitting: Physician Assistant

## 2023-11-13 DIAGNOSIS — J4489 Other specified chronic obstructive pulmonary disease: Secondary | ICD-10-CM | POA: Diagnosis not present

## 2023-11-13 DIAGNOSIS — I110 Hypertensive heart disease with heart failure: Secondary | ICD-10-CM | POA: Diagnosis not present

## 2023-11-13 DIAGNOSIS — L97812 Non-pressure chronic ulcer of other part of right lower leg with fat layer exposed: Secondary | ICD-10-CM | POA: Diagnosis not present

## 2023-11-13 DIAGNOSIS — I890 Lymphedema, not elsewhere classified: Secondary | ICD-10-CM | POA: Diagnosis present

## 2023-11-13 DIAGNOSIS — I5042 Chronic combined systolic (congestive) and diastolic (congestive) heart failure: Secondary | ICD-10-CM | POA: Diagnosis not present

## 2023-11-21 ENCOUNTER — Encounter: Admitting: Physician Assistant

## 2023-11-21 DIAGNOSIS — I890 Lymphedema, not elsewhere classified: Secondary | ICD-10-CM | POA: Diagnosis not present

## 2023-11-30 ENCOUNTER — Encounter: Admitting: Physician Assistant

## 2023-11-30 DIAGNOSIS — I890 Lymphedema, not elsewhere classified: Secondary | ICD-10-CM | POA: Diagnosis not present

## 2023-12-14 ENCOUNTER — Encounter: Attending: Physician Assistant | Admitting: Physician Assistant

## 2023-12-14 DIAGNOSIS — L97812 Non-pressure chronic ulcer of other part of right lower leg with fat layer exposed: Secondary | ICD-10-CM | POA: Insufficient documentation

## 2023-12-14 DIAGNOSIS — I130 Hypertensive heart and chronic kidney disease with heart failure and stage 1 through stage 4 chronic kidney disease, or unspecified chronic kidney disease: Secondary | ICD-10-CM | POA: Insufficient documentation

## 2023-12-14 DIAGNOSIS — J4489 Other specified chronic obstructive pulmonary disease: Secondary | ICD-10-CM | POA: Diagnosis not present

## 2023-12-14 DIAGNOSIS — N183 Chronic kidney disease, stage 3 unspecified: Secondary | ICD-10-CM | POA: Insufficient documentation

## 2023-12-14 DIAGNOSIS — I890 Lymphedema, not elsewhere classified: Secondary | ICD-10-CM | POA: Diagnosis present

## 2023-12-14 DIAGNOSIS — I5042 Chronic combined systolic (congestive) and diastolic (congestive) heart failure: Secondary | ICD-10-CM | POA: Diagnosis not present

## 2023-12-14 DIAGNOSIS — Z7901 Long term (current) use of anticoagulants: Secondary | ICD-10-CM | POA: Diagnosis not present

## 2023-12-19 ENCOUNTER — Ambulatory Visit
Admit: 2023-12-19 | Discharge: 2023-12-20 | Disposition: A | Discharge status: Home / Self Care | Payer: Medicare (Managed Care)

## 2023-12-19 ENCOUNTER — Encounter
Admit: 2023-12-19 | Discharge: 2023-12-20 | Disposition: A | Discharge status: Home / Self Care | Payer: Medicare (Managed Care)

## 2023-12-19 ENCOUNTER — Inpatient Hospital Stay
Admit: 2023-12-19 | Discharge: 2023-12-20 | Disposition: A | Discharge status: Home / Self Care | Payer: Medicare (Managed Care)

## 2023-12-19 ENCOUNTER — Encounter
Admit: 2023-12-19 | Discharge: 2023-12-20 | Disposition: A | Discharge status: Home / Self Care | Payer: Medicare (Managed Care) | Attending: Emergency Medicine

## 2023-12-20 MED ORDER — FAMOTIDINE 20 MG TABLET
ORAL_TABLET | Freq: Every evening | ORAL | 0 refills | 90.00000 days | Status: CP
Start: 2023-12-20 — End: 2024-03-19

## 2023-12-21 ENCOUNTER — Encounter: Admitting: Internal Medicine

## 2023-12-21 DIAGNOSIS — I890 Lymphedema, not elsewhere classified: Secondary | ICD-10-CM | POA: Diagnosis not present

## 2023-12-21 DIAGNOSIS — K219 Gastro-esophageal reflux disease without esophagitis: Principal | ICD-10-CM

## 2023-12-21 DIAGNOSIS — I10 Essential (primary) hypertension: Principal | ICD-10-CM

## 2023-12-21 DIAGNOSIS — E669 Obesity, unspecified: Principal | ICD-10-CM

## 2023-12-26 ENCOUNTER — Encounter

## 2023-12-26 DIAGNOSIS — I890 Lymphedema, not elsewhere classified: Secondary | ICD-10-CM | POA: Diagnosis not present

## 2023-12-28 ENCOUNTER — Encounter: Payer: Self-pay | Admitting: Urology

## 2024-01-02 ENCOUNTER — Encounter: Attending: Physician Assistant | Admitting: Physician Assistant

## 2024-01-02 DIAGNOSIS — L97812 Non-pressure chronic ulcer of other part of right lower leg with fat layer exposed: Secondary | ICD-10-CM | POA: Insufficient documentation

## 2024-01-02 DIAGNOSIS — I890 Lymphedema, not elsewhere classified: Secondary | ICD-10-CM | POA: Insufficient documentation

## 2024-01-02 DIAGNOSIS — N183 Chronic kidney disease, stage 3 unspecified: Secondary | ICD-10-CM | POA: Diagnosis not present

## 2024-01-02 DIAGNOSIS — I5042 Chronic combined systolic (congestive) and diastolic (congestive) heart failure: Secondary | ICD-10-CM | POA: Insufficient documentation

## 2024-01-02 DIAGNOSIS — I130 Hypertensive heart and chronic kidney disease with heart failure and stage 1 through stage 4 chronic kidney disease, or unspecified chronic kidney disease: Secondary | ICD-10-CM | POA: Diagnosis not present

## 2024-01-02 DIAGNOSIS — J4489 Other specified chronic obstructive pulmonary disease: Secondary | ICD-10-CM | POA: Diagnosis not present

## 2024-01-09 ENCOUNTER — Encounter: Admitting: Physician Assistant

## 2024-01-09 DIAGNOSIS — I890 Lymphedema, not elsewhere classified: Secondary | ICD-10-CM | POA: Diagnosis not present

## 2024-01-30 ENCOUNTER — Encounter: Admitting: Physician Assistant

## 2024-01-30 DIAGNOSIS — I890 Lymphedema, not elsewhere classified: Secondary | ICD-10-CM | POA: Diagnosis not present

## 2024-02-08 ENCOUNTER — Ambulatory Visit: Admitting: Physician Assistant

## 2024-02-09 ENCOUNTER — Encounter: Attending: Physician Assistant | Admitting: Physician Assistant

## 2024-02-09 DIAGNOSIS — I130 Hypertensive heart and chronic kidney disease with heart failure and stage 1 through stage 4 chronic kidney disease, or unspecified chronic kidney disease: Secondary | ICD-10-CM | POA: Diagnosis not present

## 2024-02-09 DIAGNOSIS — L97812 Non-pressure chronic ulcer of other part of right lower leg with fat layer exposed: Secondary | ICD-10-CM | POA: Diagnosis not present

## 2024-02-09 DIAGNOSIS — I5042 Chronic combined systolic (congestive) and diastolic (congestive) heart failure: Secondary | ICD-10-CM | POA: Insufficient documentation

## 2024-02-09 DIAGNOSIS — I890 Lymphedema, not elsewhere classified: Secondary | ICD-10-CM | POA: Insufficient documentation

## 2024-02-09 DIAGNOSIS — J4489 Other specified chronic obstructive pulmonary disease: Secondary | ICD-10-CM | POA: Diagnosis not present

## 2024-02-09 DIAGNOSIS — N183 Chronic kidney disease, stage 3 unspecified: Secondary | ICD-10-CM | POA: Diagnosis not present

## 2024-02-13 ENCOUNTER — Encounter: Admitting: Physician Assistant

## 2024-02-13 DIAGNOSIS — I890 Lymphedema, not elsewhere classified: Secondary | ICD-10-CM | POA: Diagnosis not present

## 2024-02-16 ENCOUNTER — Ambulatory Visit: Admitting: Physician Assistant

## 2024-02-20 ENCOUNTER — Ambulatory Visit: Admitting: Physician Assistant

## 2024-02-20 DIAGNOSIS — I890 Lymphedema, not elsewhere classified: Secondary | ICD-10-CM | POA: Diagnosis not present

## 2024-02-27 ENCOUNTER — Encounter: Admitting: Internal Medicine

## 2024-02-27 DIAGNOSIS — I890 Lymphedema, not elsewhere classified: Secondary | ICD-10-CM | POA: Diagnosis not present

## 2024-03-07 ENCOUNTER — Encounter: Attending: Physician Assistant | Admitting: Physician Assistant

## 2024-03-07 ENCOUNTER — Other Ambulatory Visit: Payer: Self-pay

## 2024-03-07 DIAGNOSIS — I5042 Chronic combined systolic (congestive) and diastolic (congestive) heart failure: Secondary | ICD-10-CM | POA: Diagnosis not present

## 2024-03-07 DIAGNOSIS — I130 Hypertensive heart and chronic kidney disease with heart failure and stage 1 through stage 4 chronic kidney disease, or unspecified chronic kidney disease: Secondary | ICD-10-CM | POA: Insufficient documentation

## 2024-03-07 DIAGNOSIS — N183 Chronic kidney disease, stage 3 unspecified: Secondary | ICD-10-CM | POA: Diagnosis not present

## 2024-03-07 DIAGNOSIS — R972 Elevated prostate specific antigen [PSA]: Secondary | ICD-10-CM

## 2024-03-07 DIAGNOSIS — L97822 Non-pressure chronic ulcer of other part of left lower leg with fat layer exposed: Secondary | ICD-10-CM | POA: Insufficient documentation

## 2024-03-07 DIAGNOSIS — L97522 Non-pressure chronic ulcer of other part of left foot with fat layer exposed: Secondary | ICD-10-CM | POA: Diagnosis not present

## 2024-03-07 DIAGNOSIS — I890 Lymphedema, not elsewhere classified: Secondary | ICD-10-CM | POA: Insufficient documentation

## 2024-03-08 ENCOUNTER — Other Ambulatory Visit: Payer: Self-pay

## 2024-03-11 ENCOUNTER — Other Ambulatory Visit

## 2024-03-11 DIAGNOSIS — R972 Elevated prostate specific antigen [PSA]: Secondary | ICD-10-CM

## 2024-03-12 LAB — PSA: Prostate Specific Ag, Serum: 7.7 ng/mL — ABNORMAL HIGH (ref 0.0–4.0)

## 2024-03-13 ENCOUNTER — Ambulatory Visit: Admitting: Urology

## 2024-03-13 ENCOUNTER — Ambulatory Visit: Payer: Self-pay | Admitting: Urology

## 2024-03-13 VITALS — BP 123/77 | HR 100 | Wt 340.6 lb

## 2024-03-13 DIAGNOSIS — R972 Elevated prostate specific antigen [PSA]: Secondary | ICD-10-CM

## 2024-03-13 NOTE — Progress Notes (Signed)
   03/13/2024 1:17 PM   Wyatt Hernandez [birth year] [id number]  Reason for visit: Follow up elevated PSA  History: Previously followed by Dr. Penne, transferred to me September 2025 after she left the practice Morbid obesity with BMI of 58 Long history of mildly elevated PSA ~5-7 since 2022, slow increase over the last few years Prostate MRI February 2024 with 20 g prostate, indeterminant PI-RADS 3 lesion, otherwise benign  Physical Exam: BP 123/77 (BP Location: Left Arm, Patient Position: Sitting, Cuff Size: Large)   Pulse 100   Wt (!) 340 lb 9.6 oz (154.5 kg)   SpO2 98%   BMI 57.56 kg/m   Imaging/labs: Most recent PSA September 2025 continues to slowly rise at 7.7  Today: No changes from last visit, denies any significant urinary symptoms.  He is motivated to avoid prostate biopsy  Plan:   We reviewed the implications of an elevated PSA and the uncertainty surrounding it. In general, a man's PSA increases with age and is produced by both normal and cancerous prostate tissue. The differential diagnosis for elevated PSA includes BPH, prostate cancer, infection/prostatitis, recent intercourse/ejaculation, recent urethroscopic manipulation (foley placement/cystoscopy) or trauma. Management of an elevated PSA can include observation/surveillance, prostate MRI, or prostate biopsy and we discussed this in detail. Our goal is to detect clinically significant prostate cancers, and manage with either active surveillance, surgery, or radiation for localized disease. Risks of prostate biopsy include bleeding, infection (including life threatening sepsis), pain, and lower urinary symptoms. Hematuria, hematospermia, and blood in the stool are all common after biopsy and can persist up to 4 weeks.  With his age and morbid obesity, benign prostate MRI, very slow rise in the PSA, he opts to defer further evaluation with biopsy at this time which I think is reasonable.  Will repeat PHI score in 1  year   Redell JAYSON Burnet, MD  [facility] [address] [place], [place] [address] [phone]

## 2024-03-13 NOTE — Patient Instructions (Signed)

## 2024-03-14 ENCOUNTER — Encounter: Admitting: Physician Assistant

## 2024-03-14 DIAGNOSIS — I890 Lymphedema, not elsewhere classified: Secondary | ICD-10-CM | POA: Diagnosis not present

## 2024-03-18 ENCOUNTER — Ambulatory Visit: Admitting: Physician Assistant

## 2024-03-22 ENCOUNTER — Encounter: Admitting: Physician Assistant

## 2024-03-22 DIAGNOSIS — I890 Lymphedema, not elsewhere classified: Secondary | ICD-10-CM | POA: Diagnosis not present

## 2024-03-29 ENCOUNTER — Ambulatory Visit: Admit: 2024-03-29 | Discharge: 2024-03-30 | Payer: Medicare (Managed Care)

## 2024-03-29 DIAGNOSIS — N1831 Stage 3a chronic kidney disease (CMS-HCC): Principal | ICD-10-CM

## 2024-03-29 DIAGNOSIS — R531 Weakness: Principal | ICD-10-CM

## 2024-04-01 ENCOUNTER — Encounter: Admitting: Physician Assistant

## 2024-04-01 DIAGNOSIS — I890 Lymphedema, not elsewhere classified: Secondary | ICD-10-CM | POA: Diagnosis not present

## 2024-04-03 DIAGNOSIS — I10 Essential (primary) hypertension: Principal | ICD-10-CM

## 2024-04-03 DIAGNOSIS — R3129 Other microscopic hematuria: Principal | ICD-10-CM

## 2024-04-03 DIAGNOSIS — R600 Localized edema: Principal | ICD-10-CM

## 2024-04-03 DIAGNOSIS — N1832 Stage 3b chronic kidney disease (CMS-HCC): Principal | ICD-10-CM

## 2024-04-08 ENCOUNTER — Encounter: Attending: Physician Assistant | Admitting: Physician Assistant

## 2024-04-08 DIAGNOSIS — L97822 Non-pressure chronic ulcer of other part of left lower leg with fat layer exposed: Secondary | ICD-10-CM | POA: Insufficient documentation

## 2024-04-08 DIAGNOSIS — L97522 Non-pressure chronic ulcer of other part of left foot with fat layer exposed: Secondary | ICD-10-CM | POA: Insufficient documentation

## 2024-04-08 DIAGNOSIS — J4489 Other specified chronic obstructive pulmonary disease: Secondary | ICD-10-CM | POA: Insufficient documentation

## 2024-04-08 DIAGNOSIS — I890 Lymphedema, not elsewhere classified: Secondary | ICD-10-CM | POA: Diagnosis present

## 2024-04-08 DIAGNOSIS — I5042 Chronic combined systolic (congestive) and diastolic (congestive) heart failure: Secondary | ICD-10-CM | POA: Diagnosis not present

## 2024-04-08 DIAGNOSIS — I130 Hypertensive heart and chronic kidney disease with heart failure and stage 1 through stage 4 chronic kidney disease, or unspecified chronic kidney disease: Secondary | ICD-10-CM | POA: Insufficient documentation

## 2024-04-08 DIAGNOSIS — N183 Chronic kidney disease, stage 3 unspecified: Secondary | ICD-10-CM | POA: Insufficient documentation

## 2024-04-15 ENCOUNTER — Encounter: Admitting: Physician Assistant

## 2024-04-15 DIAGNOSIS — I890 Lymphedema, not elsewhere classified: Secondary | ICD-10-CM | POA: Diagnosis not present

## 2024-04-22 ENCOUNTER — Encounter: Admitting: Physician Assistant

## 2024-04-22 DIAGNOSIS — I890 Lymphedema, not elsewhere classified: Secondary | ICD-10-CM | POA: Diagnosis not present

## 2024-04-30 ENCOUNTER — Encounter: Admitting: Physician Assistant

## 2024-04-30 DIAGNOSIS — I890 Lymphedema, not elsewhere classified: Secondary | ICD-10-CM | POA: Diagnosis not present

## 2024-05-07 ENCOUNTER — Encounter: Attending: Physician Assistant | Admitting: Physician Assistant

## 2024-05-07 DIAGNOSIS — L97522 Non-pressure chronic ulcer of other part of left foot with fat layer exposed: Secondary | ICD-10-CM | POA: Insufficient documentation

## 2024-05-07 DIAGNOSIS — I130 Hypertensive heart and chronic kidney disease with heart failure and stage 1 through stage 4 chronic kidney disease, or unspecified chronic kidney disease: Secondary | ICD-10-CM | POA: Insufficient documentation

## 2024-05-07 DIAGNOSIS — L97822 Non-pressure chronic ulcer of other part of left lower leg with fat layer exposed: Secondary | ICD-10-CM | POA: Insufficient documentation

## 2024-05-07 DIAGNOSIS — I5042 Chronic combined systolic (congestive) and diastolic (congestive) heart failure: Secondary | ICD-10-CM | POA: Insufficient documentation

## 2024-05-07 DIAGNOSIS — J4489 Other specified chronic obstructive pulmonary disease: Secondary | ICD-10-CM | POA: Insufficient documentation

## 2024-05-07 DIAGNOSIS — N183 Chronic kidney disease, stage 3 unspecified: Secondary | ICD-10-CM | POA: Insufficient documentation

## 2024-05-12 MED ORDER — ELIQUIS 5 MG TABLET
ORAL_TABLET | Freq: Two times a day (BID) | ORAL | 3 refills | 0.00000 days
Start: 2024-05-12 — End: ?

## 2024-05-13 ENCOUNTER — Encounter: Admitting: Physician Assistant

## 2024-05-14 ENCOUNTER — Encounter: Admitting: Physician Assistant

## 2024-05-16 MED ORDER — ELIQUIS 5 MG TABLET
ORAL_TABLET | Freq: Two times a day (BID) | ORAL | 0 refills | 90.00000 days | Status: CP
Start: 2024-05-16 — End: ?

## 2024-05-20 ENCOUNTER — Encounter: Admitting: Physician Assistant

## 2024-05-27 ENCOUNTER — Encounter: Admitting: Physician Assistant

## 2024-06-04 ENCOUNTER — Encounter: Attending: Physician Assistant | Admitting: Physician Assistant

## 2024-06-04 DIAGNOSIS — L97822 Non-pressure chronic ulcer of other part of left lower leg with fat layer exposed: Secondary | ICD-10-CM | POA: Insufficient documentation

## 2024-06-04 DIAGNOSIS — L97522 Non-pressure chronic ulcer of other part of left foot with fat layer exposed: Secondary | ICD-10-CM | POA: Diagnosis not present

## 2024-06-04 DIAGNOSIS — I130 Hypertensive heart and chronic kidney disease with heart failure and stage 1 through stage 4 chronic kidney disease, or unspecified chronic kidney disease: Secondary | ICD-10-CM | POA: Diagnosis not present

## 2024-06-04 DIAGNOSIS — I5042 Chronic combined systolic (congestive) and diastolic (congestive) heart failure: Secondary | ICD-10-CM | POA: Insufficient documentation

## 2024-06-04 DIAGNOSIS — I890 Lymphedema, not elsewhere classified: Secondary | ICD-10-CM | POA: Diagnosis present

## 2024-06-04 DIAGNOSIS — N183 Chronic kidney disease, stage 3 unspecified: Secondary | ICD-10-CM | POA: Diagnosis not present

## 2024-06-04 DIAGNOSIS — J4489 Other specified chronic obstructive pulmonary disease: Secondary | ICD-10-CM | POA: Diagnosis not present

## 2024-06-13 ENCOUNTER — Encounter

## 2024-06-13 DIAGNOSIS — I890 Lymphedema, not elsewhere classified: Secondary | ICD-10-CM | POA: Diagnosis not present

## 2024-06-18 ENCOUNTER — Encounter: Admitting: Internal Medicine

## 2024-06-24 ENCOUNTER — Ambulatory Visit: Admit: 2024-06-24 | Discharge: 2024-06-25 | Payer: Medicare (Managed Care)

## 2024-06-24 DIAGNOSIS — D649 Anemia, unspecified: Principal | ICD-10-CM

## 2024-06-24 DIAGNOSIS — Z7901 Long term (current) use of anticoagulants: Principal | ICD-10-CM

## 2024-06-24 DIAGNOSIS — I82411 Acute embolism and thrombosis of right femoral vein: Principal | ICD-10-CM

## 2024-06-24 MED ORDER — APIXABAN 5 MG TABLET
ORAL_TABLET | Freq: Two times a day (BID) | ORAL | 3 refills | 90.00000 days | Status: CP
Start: 2024-06-24 — End: 2025-06-24

## 2024-06-25 ENCOUNTER — Encounter

## 2024-06-25 DIAGNOSIS — I890 Lymphedema, not elsewhere classified: Secondary | ICD-10-CM | POA: Diagnosis not present

## 2024-07-03 ENCOUNTER — Encounter

## 2024-07-03 DIAGNOSIS — I890 Lymphedema, not elsewhere classified: Secondary | ICD-10-CM | POA: Diagnosis not present

## 2024-07-09 ENCOUNTER — Encounter: Attending: Physician Assistant | Admitting: Physician Assistant

## 2024-07-09 DIAGNOSIS — I130 Hypertensive heart and chronic kidney disease with heart failure and stage 1 through stage 4 chronic kidney disease, or unspecified chronic kidney disease: Secondary | ICD-10-CM | POA: Insufficient documentation

## 2024-07-09 DIAGNOSIS — L97822 Non-pressure chronic ulcer of other part of left lower leg with fat layer exposed: Secondary | ICD-10-CM | POA: Insufficient documentation

## 2024-07-09 DIAGNOSIS — N183 Chronic kidney disease, stage 3 unspecified: Secondary | ICD-10-CM | POA: Insufficient documentation

## 2024-07-09 DIAGNOSIS — I890 Lymphedema, not elsewhere classified: Secondary | ICD-10-CM | POA: Insufficient documentation

## 2024-07-09 DIAGNOSIS — J4489 Other specified chronic obstructive pulmonary disease: Secondary | ICD-10-CM | POA: Diagnosis not present

## 2024-07-09 DIAGNOSIS — L97522 Non-pressure chronic ulcer of other part of left foot with fat layer exposed: Secondary | ICD-10-CM | POA: Diagnosis not present

## 2024-07-09 DIAGNOSIS — I5042 Chronic combined systolic (congestive) and diastolic (congestive) heart failure: Secondary | ICD-10-CM | POA: Diagnosis not present

## 2024-07-17 ENCOUNTER — Encounter: Admitting: Physician Assistant

## 2024-07-17 DIAGNOSIS — I890 Lymphedema, not elsewhere classified: Secondary | ICD-10-CM | POA: Diagnosis not present

## 2024-07-22 ENCOUNTER — Encounter: Admitting: Physician Assistant

## 2024-07-22 DIAGNOSIS — I890 Lymphedema, not elsewhere classified: Secondary | ICD-10-CM | POA: Diagnosis not present

## 2024-07-29 ENCOUNTER — Encounter: Admitting: Physician Assistant

## 2024-07-30 ENCOUNTER — Encounter: Admitting: Physician Assistant

## 2024-07-30 DIAGNOSIS — I890 Lymphedema, not elsewhere classified: Secondary | ICD-10-CM | POA: Diagnosis not present

## 2024-08-05 ENCOUNTER — Encounter: Admitting: Physician Assistant

## 2024-08-06 ENCOUNTER — Encounter: Admitting: Physician Assistant

## 2024-08-12 ENCOUNTER — Encounter: Admitting: Physician Assistant

## 2025-03-13 ENCOUNTER — Other Ambulatory Visit

## 2025-03-19 ENCOUNTER — Ambulatory Visit: Admitting: Urology
